# Patient Record
Sex: Male | Born: 1960
Health system: Southern US, Community
[De-identification: ages and names within clinical notes are randomized; demographics above are authoritative.]

## PROBLEM LIST (undated history)

## (undated) DIAGNOSIS — I2699 Other pulmonary embolism without acute cor pulmonale: Secondary | ICD-10-CM

## (undated) DIAGNOSIS — L219 Seborrheic dermatitis, unspecified: Secondary | ICD-10-CM

## (undated) DIAGNOSIS — I471 Supraventricular tachycardia, unspecified: Secondary | ICD-10-CM

## (undated) DIAGNOSIS — J45909 Unspecified asthma, uncomplicated: Secondary | ICD-10-CM

## (undated) DIAGNOSIS — D510 Vitamin B12 deficiency anemia due to intrinsic factor deficiency: Secondary | ICD-10-CM

## (undated) DIAGNOSIS — E039 Hypothyroidism, unspecified: Secondary | ICD-10-CM

## (undated) DIAGNOSIS — T7840XA Allergy, unspecified, initial encounter: Secondary | ICD-10-CM

## (undated) DIAGNOSIS — M1990 Unspecified osteoarthritis, unspecified site: Secondary | ICD-10-CM

## (undated) DIAGNOSIS — E119 Type 2 diabetes mellitus without complications: Secondary | ICD-10-CM

## (undated) DIAGNOSIS — Z5189 Encounter for other specified aftercare: Secondary | ICD-10-CM

## (undated) HISTORY — DX: Other pulmonary embolism without acute cor pulmonale: I26.99

## (undated) HISTORY — DX: Unspecified asthma, uncomplicated: J45.909

## (undated) HISTORY — DX: Vitamin B12 deficiency anemia due to intrinsic factor deficiency: D51.0

## (undated) HISTORY — DX: Allergy, unspecified, initial encounter: T78.40XA

## (undated) HISTORY — DX: Hypothyroidism, unspecified: E03.9

## (undated) HISTORY — DX: Unspecified osteoarthritis, unspecified site: M19.90

## (undated) HISTORY — DX: Seborrheic dermatitis, unspecified: L21.9

## (undated) HISTORY — PX: OTHER SURGICAL HISTORY: SHX169

## (undated) HISTORY — DX: Encounter for other specified aftercare: Z51.89

## (undated) HISTORY — DX: Supraventricular tachycardia: I47.1

## (undated) HISTORY — DX: Supraventricular tachycardia, unspecified: I47.10

## (undated) HISTORY — DX: Type 2 diabetes mellitus without complications: E11.9

---

## 1999-12-09 ENCOUNTER — Emergency Department (HOSPITAL_COMMUNITY): Admission: EM | Admit: 1999-12-09 | Discharge: 1999-12-09 | Payer: Self-pay | Admitting: *Deleted

## 2002-04-27 ENCOUNTER — Ambulatory Visit (HOSPITAL_COMMUNITY): Admission: RE | Admit: 2002-04-27 | Discharge: 2002-04-27 | Payer: Self-pay | Admitting: Pulmonary Disease

## 2005-05-05 ENCOUNTER — Emergency Department (HOSPITAL_COMMUNITY): Admission: EM | Admit: 2005-05-05 | Discharge: 2005-05-05 | Payer: Self-pay | Admitting: Emergency Medicine

## 2007-07-16 ENCOUNTER — Ambulatory Visit (HOSPITAL_COMMUNITY): Admission: RE | Admit: 2007-07-16 | Discharge: 2007-07-16 | Payer: Self-pay | Admitting: Pulmonary Disease

## 2010-03-18 ENCOUNTER — Encounter: Payer: Self-pay | Admitting: Pulmonary Disease

## 2010-07-13 NOTE — Procedures (Signed)
   Justin Klein, Justin Klein                          ACCOUNT NO.:  [id number]   MEDICAL RECORD NO.:  [id number]                   PATIENT TYPE:  OUT   LOCATION:  RESP                                 FACILITY:  [facility]   PHYSICIAN:  Edward L. Juanetta Gosling, M.D.             DATE OF BIRTH:  08/23/1960   DATE OF PROCEDURE:  DATE OF DISCHARGE:  04/27/2002                              PULMONARY FUNCTION TEST   PFT FINDINGS:  1. Spirometry shows air flow obstruction at the area of the small airway but     otherwise is normal.  2. Lung volumes are normal.  3. DLCO is normal.  4. Arterial blood gas shows mild resting hypoxemia.                                               Edward L. Juanetta Gosling, M.D.    ELH/MEDQ  D:  04/28/2002  T:  04/29/2002  Job:  [id number]

## 2010-10-27 ENCOUNTER — Inpatient Hospital Stay: Payer: Self-pay | Admitting: Student

## 2010-10-27 ENCOUNTER — Ambulatory Visit: Payer: Self-pay | Admitting: Internal Medicine

## 2010-11-05 ENCOUNTER — Ambulatory Visit: Payer: Self-pay | Admitting: Internal Medicine

## 2010-11-26 ENCOUNTER — Ambulatory Visit: Payer: Self-pay | Admitting: Internal Medicine

## 2011-04-26 ENCOUNTER — Other Ambulatory Visit (HOSPITAL_COMMUNITY): Payer: Self-pay | Admitting: Pulmonary Disease

## 2011-04-26 DIAGNOSIS — Z86711 Personal history of pulmonary embolism: Secondary | ICD-10-CM

## 2011-04-26 DIAGNOSIS — M79606 Pain in leg, unspecified: Secondary | ICD-10-CM

## 2011-05-03 ENCOUNTER — Ambulatory Visit (HOSPITAL_COMMUNITY)
Admission: RE | Admit: 2011-05-03 | Discharge: 2011-05-03 | Disposition: A | Discharge status: Home / Self Care | Payer: Managed Care, Other (non HMO) | Source: Ambulatory Visit | Attending: Pulmonary Disease | Admitting: Pulmonary Disease

## 2011-05-03 DIAGNOSIS — M79606 Pain in leg, unspecified: Secondary | ICD-10-CM

## 2011-05-03 DIAGNOSIS — Z86711 Personal history of pulmonary embolism: Secondary | ICD-10-CM

## 2011-05-03 DIAGNOSIS — Z86718 Personal history of other venous thrombosis and embolism: Secondary | ICD-10-CM | POA: Insufficient documentation

## 2011-05-03 DIAGNOSIS — M79609 Pain in unspecified limb: Secondary | ICD-10-CM | POA: Insufficient documentation

## 2011-05-13 ENCOUNTER — Ambulatory Visit (HOSPITAL_COMMUNITY)
Admission: RE | Admit: 2011-05-13 | Discharge: 2011-05-13 | Disposition: A | Discharge status: Home / Self Care | Payer: Managed Care, Other (non HMO) | Source: Ambulatory Visit | Attending: Pulmonary Disease | Admitting: Pulmonary Disease

## 2011-05-13 DIAGNOSIS — I499 Cardiac arrhythmia, unspecified: Secondary | ICD-10-CM | POA: Insufficient documentation

## 2011-05-13 DIAGNOSIS — I4949 Other premature depolarization: Secondary | ICD-10-CM | POA: Insufficient documentation

## 2011-05-13 NOTE — Progress Notes (Signed)
*  PRELIMINARY RESULTS* Echocardiogram 48H Holter monitor has been performed.  Justin Klein 05/13/2011, 3:05 PM

## 2011-05-20 ENCOUNTER — Encounter: Payer: Self-pay | Admitting: Cardiology

## 2011-08-07 ENCOUNTER — Ambulatory Visit: Payer: Managed Care, Other (non HMO) | Admitting: Cardiology

## 2011-08-26 ENCOUNTER — Encounter: Payer: Self-pay | Admitting: Cardiology

## 2011-08-26 ENCOUNTER — Ambulatory Visit (INDEPENDENT_AMBULATORY_CARE_PROVIDER_SITE_OTHER): Payer: Managed Care, Other (non HMO) | Admitting: Cardiology

## 2011-08-26 VITALS — BP 128/82 | HR 69 | Ht 69.0 in | Wt 206.0 lb

## 2011-08-26 DIAGNOSIS — I471 Supraventricular tachycardia, unspecified: Secondary | ICD-10-CM

## 2011-08-26 DIAGNOSIS — I2699 Other pulmonary embolism without acute cor pulmonale: Secondary | ICD-10-CM

## 2011-08-26 DIAGNOSIS — J45909 Unspecified asthma, uncomplicated: Secondary | ICD-10-CM | POA: Insufficient documentation

## 2011-08-26 HISTORY — DX: Supraventricular tachycardia: I47.1

## 2011-08-26 HISTORY — DX: Other pulmonary embolism without acute cor pulmonale: I26.99

## 2011-08-26 HISTORY — DX: Unspecified asthma, uncomplicated: J45.909

## 2011-08-26 HISTORY — DX: Supraventricular tachycardia, unspecified: I47.10

## 2011-08-26 MED ORDER — DILTIAZEM HCL 30 MG PO TABS: 30.0000 mg | ORAL_TABLET | ORAL | Status: DC | PRN

## 2011-08-26 NOTE — Progress Notes (Signed)
Clinical Summary Mr. Justin Klein is a 51 y.o.male referred for cardiology consultation by Dr. Juanetta Gosling. He reports a long-standing history of intermittent palpitations since he was a teenager. He describes a sudden onset of rapid heart rate, sometimes preceded by a feeling of a "skip." This does not occur in any specific pattern, has not been associated with syncope. He has had episodes noted within the last few months associated with mild dizziness.  48-hour Holter monitor reviewed from March of this year, ordered by Dr. Juanetta Gosling, demonstrating sinus rhythm ranging 48-144 beats per minute with occasional PVCs and PACs. He did have some brief runs of SVT as well as possible atrial fibrillation. No pauses noted. Nonspecific ST-T changes noted mainly in channel 2. No symptoms were reported in the diary. This was followed by a CardioNet monitor which was ordered in April through May. This study showed sinus rhythm with occasional PVCs, also evidence of regular PSVT as fast as 213 beats per minute. One episode of bradycardia noted into the high 30s There were no pauses.  Recent lab work from June showed hemoglobin 14.1, platelets 275, vitamin B12 534.  Today we reviewed the findings of his heart monitor, implications of PSVT, probable mechanism, and general treatment strategies including vagal maneuvers, avoiding stimulants, and medical therapy. We also briefly discussed possibility of radiofrequency ablation. At this point he preferred medical therapy and observation.   No Known Allergies  Current Outpatient Prescriptions  Medication Sig Dispense Refill  . albuterol (PROVENTIL) (2.5 MG/3ML) 0.083% nebulizer solution Take 2.5 mg by nebulization every 6 (six) hours as needed.      . budesonide-formoterol (SYMBICORT) 160-4.5 MCG/ACT inhaler Inhale 2 puffs into the lungs 2 (two) times daily.      Marland Kitchen levothyroxine (SYNTHROID) 137 MCG tablet Take 137 mcg by mouth daily.      . sildenafil (VIAGRA) 50 MG tablet  Take 50 mg by mouth as needed.      . warfarin (COUMADIN) 5 MG tablet Take 5 mg by mouth daily.      Marland Kitchen diltiazem (CARDIZEM) 30 MG tablet Take 1 tablet (30 mg total) by mouth as needed.  30 tablet  0    Past Medical History  Diagnosis Date  . Seborrheic dermatitis   . Diabetes mellitus, type 2   . Asthma   . Hypothyroidism   . Pernicious anemia   . Pulmonary embolus     Diagnosed 9/12 at [facility]    Past Surgical History  Procedure Date  . Left patella tendon repair   . Epidermal cyst resection     Family History  Problem Relation Age of Onset  . Diabetes type II Mother   . Cancer Father   . Diabetes type II Sister     Social History Mr. Ellery reports that he has never smoked. He has never used smokeless tobacco. Mr. Kron reports that he does not drink alcohol.  Review of Systems No regular exertional chest pain or unusual shortness of breath. No reported bleeding problems on Coumadin, followed by Dr. Juanetta Gosling. Otherwise negative.  Physical Examination Filed Vitals:   08/26/11 1005  BP: 128/82  Pulse: 69   Overweight male in no acute distress. HEENT: Conjunctiva and lids normal, oropharynx clear. Neck: Supple, no elevated JVP or carotid bruits, no thyromegaly. Lungs: Clear to auscultation, nonlabored breathing at rest. Cardiac: Regular rate and rhythm, no S3 or significant systolic murmur, no pericardial rub. Abdomen: Soft, nontender, bowel sounds present, no guarding or rebound. Extremities: No pitting edema,  distal pulses 2+. Skin: Warm and dry. Musculoskeletal: No kyphosis. Neuropsychiatric: Alert and oriented x3, affect grossly appropriate.   ECG Normal sinus rhythm.   Problem List and Plan   PSVT (paroxysmal supraventricular tachycardia) Outlined above, likely reentrant mechanism such as AVNRT. We will obtain a complete echo echocardiogram to assess cardiac structure and function. For now he will have Cardizem 30 mg p.r.n. for prolonged palpitations.  We also discussed vagal maneuvers. Depending on frequency and duration of symptoms, we may need to advance medical therapy, and/or proceed with an EP consultation. Followup arranged.  Asthma Seems to be adequately controlled on present regimen, followed by Dr. Juanetta Gosling.  Pulmonary embolus Reportedly diagnosed at [facility] back in September, on Coumadin per Dr. Juanetta Gosling.    Jonelle Sidle, M.D., F.A.C.C.

## 2011-08-26 NOTE — Patient Instructions (Addendum)
**Note De-Identified Onie Hayashi Obfuscation** Your physician has requested that you have an echocardiogram. Echocardiography is a painless test that uses sound waves to create images of your heart. It provides your doctor with information about the size and shape of your heart and how well your heart's chambers and valves are working. This procedure takes approximately one hour. There are no restrictions for this procedure.  Your physician has recommended you make the following change in your medication: start taking Cardizem 30 mg to be taken as needed for prolonged palpitations  Your physician recommends that you schedule a follow-up appointment in: 3 months

## 2011-08-26 NOTE — Assessment & Plan Note (Signed)
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 We will obtain a complete echo echocardiogram to assess cardiac structure and function. For now he will have Cardizem 30 mg p.r.n. for prolonged palpitations. We also discussed vagal maneuvers. Depending on frequency and duration of symptoms, we may need to advance medical therapy, and/or proceed with an EP consultation. Followup arranged.

## 2011-08-26 NOTE — Assessment & Plan Note (Signed)
Reportedly diagnosed at [facility] back in September, on Coumadin per Dr. Juanetta Gosling.

## 2011-08-26 NOTE — Assessment & Plan Note (Signed)
Seems to be adequately controlled on present regimen, followed by Dr. Juanetta Gosling.

## 2011-08-30 ENCOUNTER — Ambulatory Visit (HOSPITAL_COMMUNITY): Payer: Managed Care, Other (non HMO) | Attending: Cardiology

## 2011-08-30 DIAGNOSIS — I471 Supraventricular tachycardia, unspecified: Secondary | ICD-10-CM | POA: Insufficient documentation

## 2011-08-30 DIAGNOSIS — I2699 Other pulmonary embolism without acute cor pulmonale: Secondary | ICD-10-CM | POA: Insufficient documentation

## 2011-08-30 DIAGNOSIS — J45909 Unspecified asthma, uncomplicated: Secondary | ICD-10-CM | POA: Insufficient documentation

## 2011-08-30 DIAGNOSIS — I517 Cardiomegaly: Secondary | ICD-10-CM | POA: Insufficient documentation

## 2011-08-30 DIAGNOSIS — E119 Type 2 diabetes mellitus without complications: Secondary | ICD-10-CM | POA: Insufficient documentation

## 2011-08-30 DIAGNOSIS — R002 Palpitations: Secondary | ICD-10-CM | POA: Insufficient documentation

## 2011-08-30 NOTE — Progress Notes (Signed)
Echocardiogram performed.  

## 2011-09-05 ENCOUNTER — Ambulatory Visit (INDEPENDENT_AMBULATORY_CARE_PROVIDER_SITE_OTHER): Payer: Managed Care, Other (non HMO) | Admitting: Otolaryngology

## 2011-09-05 DIAGNOSIS — K121 Other forms of stomatitis: Secondary | ICD-10-CM

## 2011-09-05 DIAGNOSIS — R490 Dysphonia: Secondary | ICD-10-CM

## 2011-10-22 ENCOUNTER — Other Ambulatory Visit (HOSPITAL_COMMUNITY): Payer: Self-pay | Admitting: Pulmonary Disease

## 2011-10-22 DIAGNOSIS — I82409 Acute embolism and thrombosis of unspecified deep veins of unspecified lower extremity: Secondary | ICD-10-CM

## 2011-10-22 DIAGNOSIS — O2230 Deep phlebothrombosis in pregnancy, unspecified trimester: Secondary | ICD-10-CM

## 2011-10-23 ENCOUNTER — Ambulatory Visit (HOSPITAL_COMMUNITY)
Admission: RE | Admit: 2011-10-23 | Discharge: 2011-10-23 | Disposition: A | Discharge status: Home / Self Care | Payer: Managed Care, Other (non HMO) | Source: Ambulatory Visit | Attending: Pulmonary Disease | Admitting: Pulmonary Disease

## 2011-10-23 DIAGNOSIS — I82409 Acute embolism and thrombosis of unspecified deep veins of unspecified lower extremity: Secondary | ICD-10-CM | POA: Insufficient documentation

## 2011-11-07 ENCOUNTER — Other Ambulatory Visit: Payer: Self-pay

## 2012-04-09 ENCOUNTER — Ambulatory Visit (HOSPITAL_COMMUNITY): Payer: BC Managed Care – PPO | Admitting: Physical Therapy

## 2012-04-16 ENCOUNTER — Ambulatory Visit (HOSPITAL_COMMUNITY)
Admission: RE | Admit: 2012-04-16 | Discharge: 2012-04-16 | Disposition: A | Discharge status: Home / Self Care | Payer: BC Managed Care – PPO | Source: Ambulatory Visit | Attending: Pulmonary Disease | Admitting: Pulmonary Disease

## 2012-04-16 DIAGNOSIS — G7289 Other specified myopathies: Secondary | ICD-10-CM | POA: Insufficient documentation

## 2012-04-16 DIAGNOSIS — IMO0001 Reserved for inherently not codable concepts without codable children: Secondary | ICD-10-CM | POA: Insufficient documentation

## 2012-04-16 DIAGNOSIS — M6281 Muscle weakness (generalized): Secondary | ICD-10-CM | POA: Insufficient documentation

## 2012-04-16 NOTE — Evaluation (Signed)
Physical Therapy Evaluation  Patient Details  Name: Justin Klein MRN: [id number] Date of Birth: 1960/12/08  Today's Date: 04/16/2012 Time: 1300-1610 PT Time Calculation (min): 190 min  Visit#: 1 of 1  Authorization: [employer]  Past Medical History:  Past Medical History  Diagnosis Date  . Seborrheic dermatitis   . Diabetes mellitus, type 2   . Asthma   . Hypothyroidism   . Pernicious anemia   . Pulmonary embolus     Diagnosed 9/12 at [facility]   Past Surgical History:  Past Surgical History  Procedure Laterality Date  . Left patella tendon repair    . Epidermal cyst resection        Physical Therapy Assessment and Plan PT Assessment and Plan Clinical Impression Statement: One time functional evaluation to see pt safe functioning limitations.  Please see FCE report which will be scanned into pt chart. PT Plan: one time evaluation    Goals  to determine pt safe functioning level.  Problem List Patient Active Problem List  Diagnosis  . PSVT (paroxysmal supraventricular tachycardia)  . Asthma  . Pulmonary embolus    PT Plan of Care PT Home Exercise Plan: N/A  GP    RUSSELL,CINDY 04/16/2012, 4:31 PM  Physician Documentation Your signature is required to indicate approval of the treatment plan as stated above.  Please sign and either send electronically or make a copy of this report for your files and return this physician signed original.   Please mark one 1.__approve of plan  2. ___approve of plan with the following conditions.   ______________________________                                                          _____________________ Physician Signature                                                                                                             Date

## 2012-07-21 DIAGNOSIS — R200 Anesthesia of skin: Secondary | ICD-10-CM

## 2012-07-21 DIAGNOSIS — R29898 Other symptoms and signs involving the musculoskeletal system: Secondary | ICD-10-CM

## 2012-07-21 HISTORY — DX: Anesthesia of skin: R20.0

## 2012-07-21 HISTORY — DX: Other symptoms and signs involving the musculoskeletal system: R29.898

## 2012-09-07 DIAGNOSIS — Z0289 Encounter for other administrative examinations: Secondary | ICD-10-CM

## 2012-10-20 ENCOUNTER — Other Ambulatory Visit: Payer: Self-pay | Admitting: Cardiology

## 2012-10-22 IMAGING — US ABDOMEN ULTRASOUND
1 series · 17 of 25 positions shown · non-contrast
Comparison: none

REASON FOR EXAM: hepatitis
COMMENTS:

[Series 1: abdomen ultrasound · 17 of 62 slices shown]
[im 1/62]
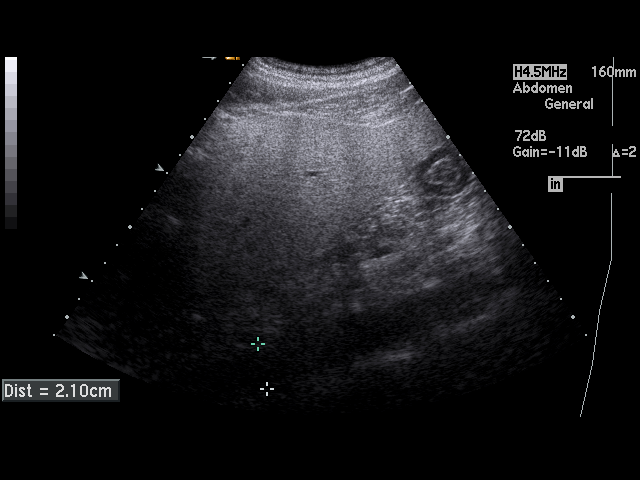
[im 6/62]
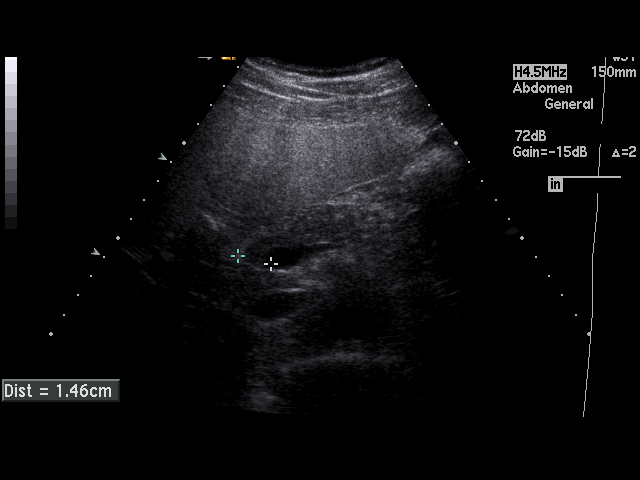
[im 8/62]
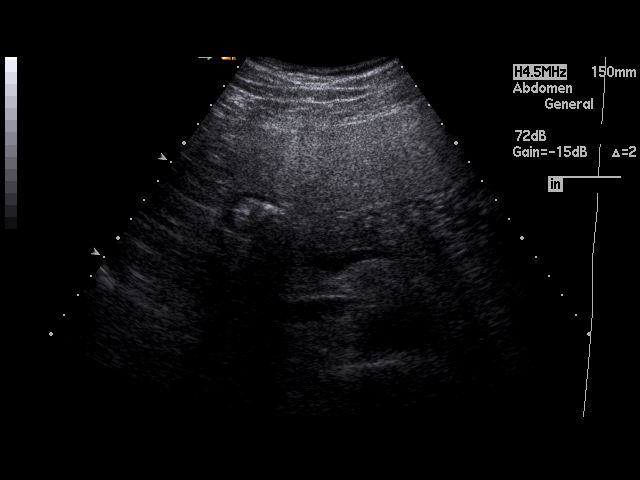
[im 13/62]
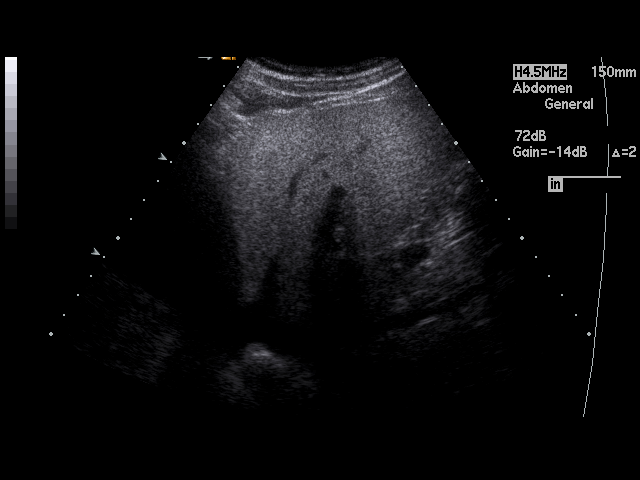
[im 16/62]
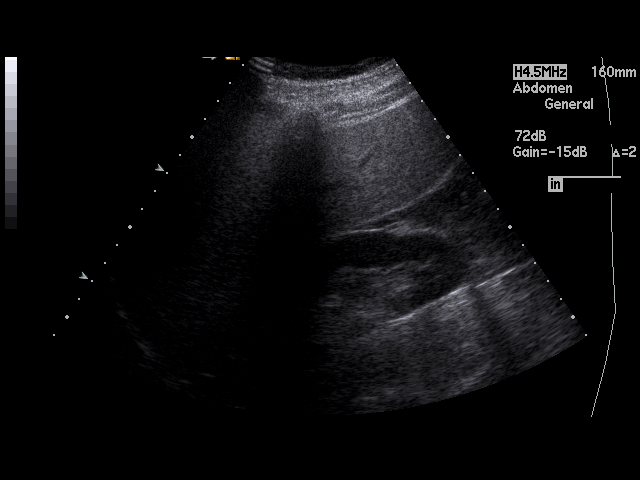
[im 21/62]
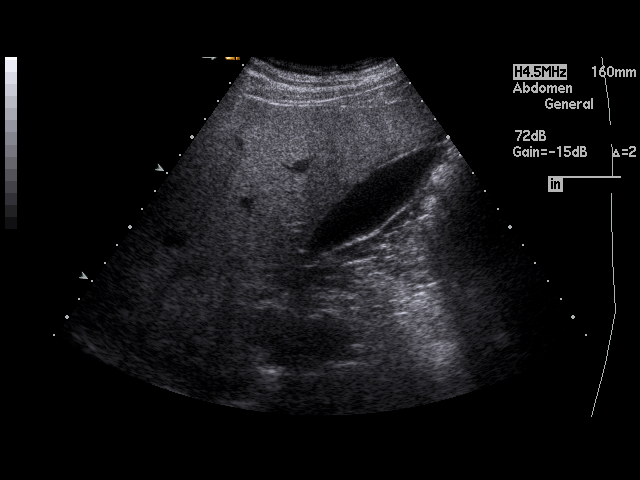
[im 23/62]
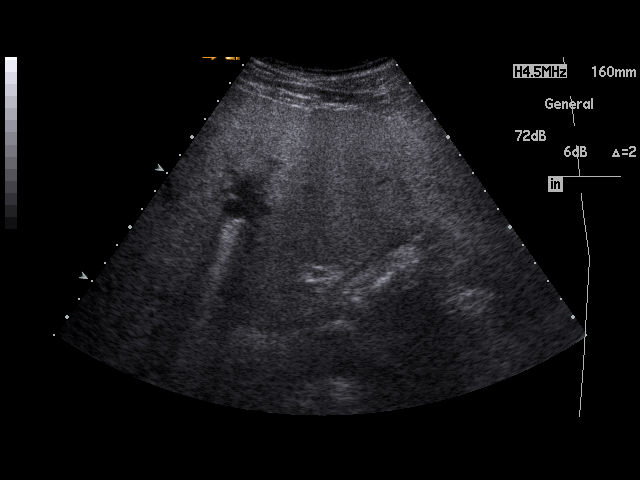
[im 28/62]
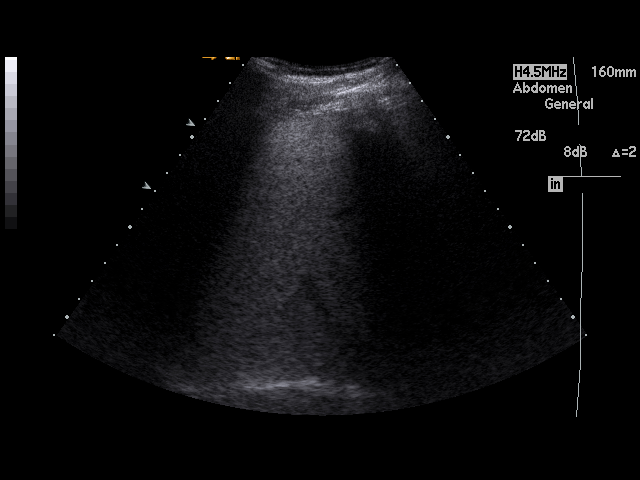
[im 31/62]
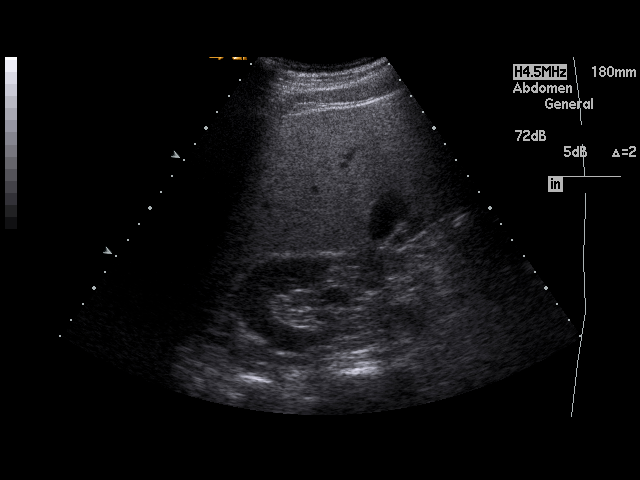
[im 34/62]
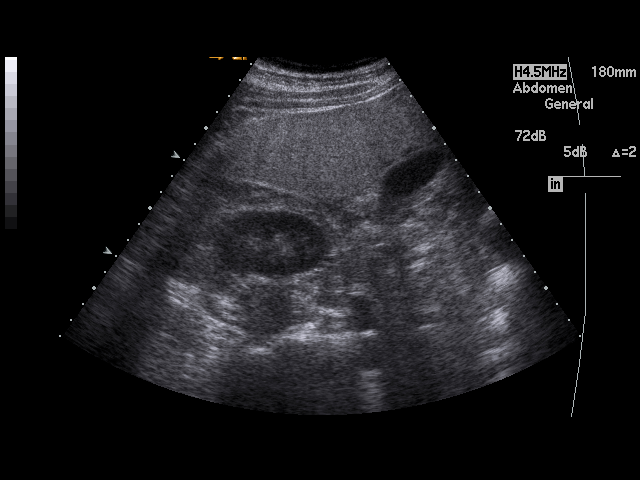
[im 39/62]
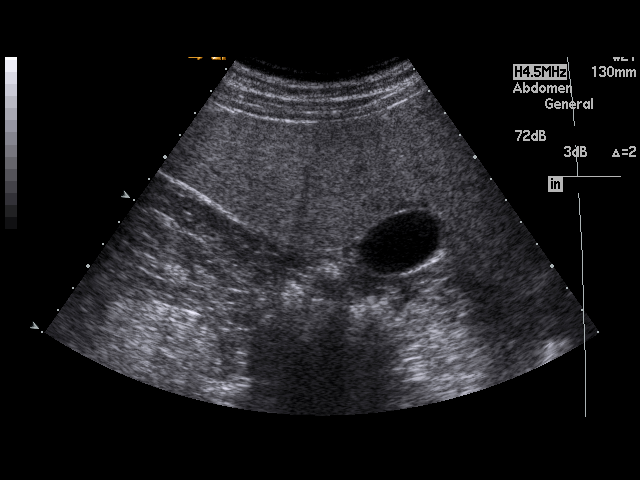
[im 41/62]
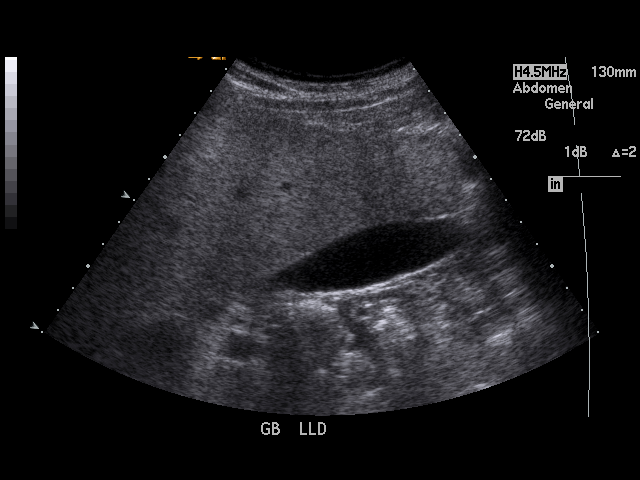
[im 46/62]
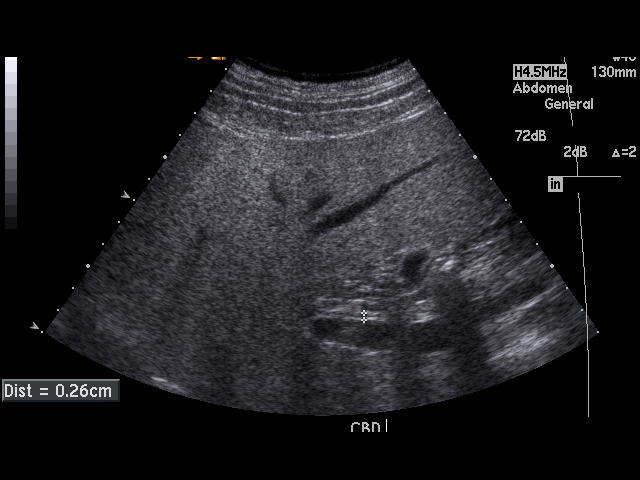
[im 49/62]
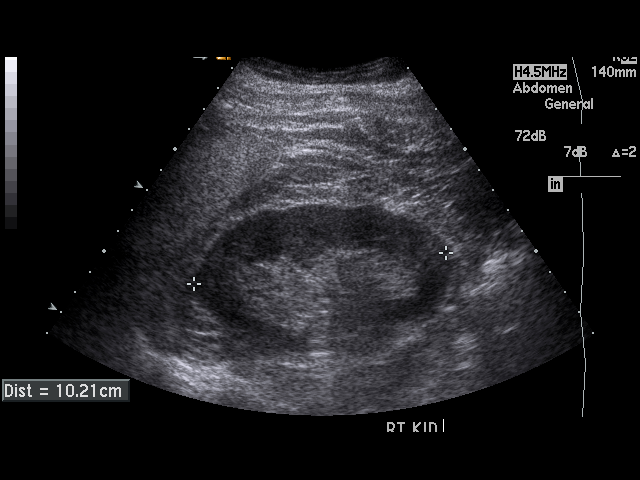
[im 54/62]
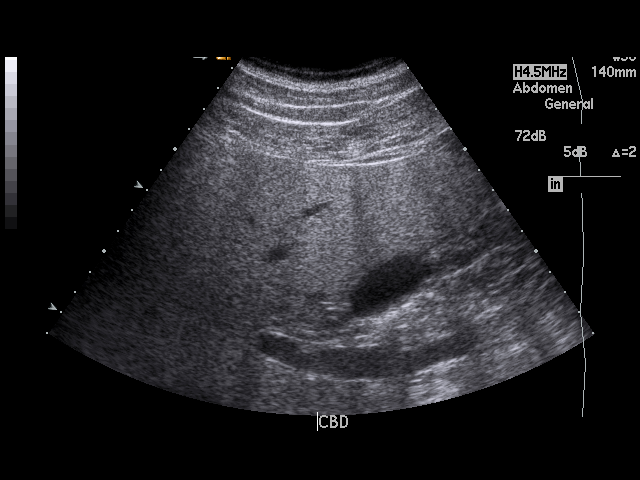
[im 56/62]
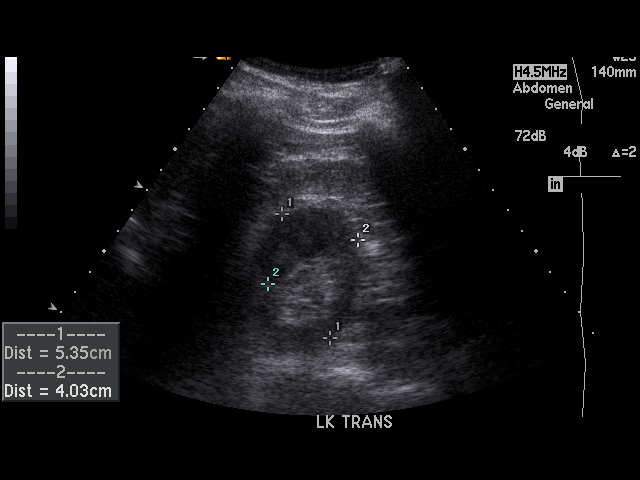
[im 62/62]
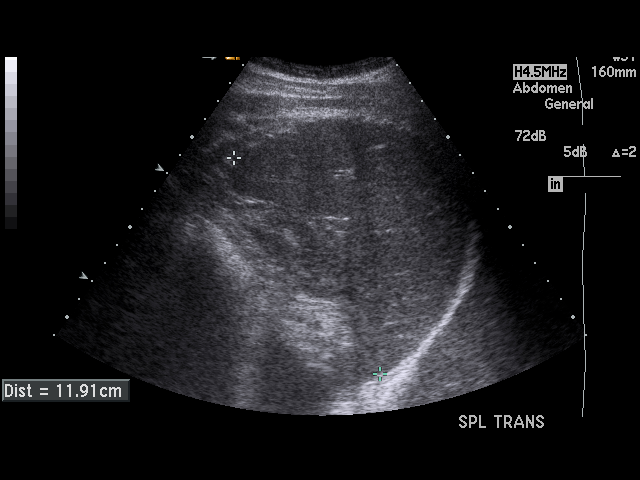

[17 of 25 positions shown; findings below may reference images not displayed]

PROCEDURE:     US  - US ABDOMEN GENERAL SURVEY  - October 27, 2010 [DATE]

RESULT:     Ultrasound of the abdomen is performed. The liver shows
extensive increased echogenicity consistent with diffuse fatty infiltration.
The pancreas is somewhat heterogeneous which could represent fatty
infiltration. The spleen is borderline enlarged. Right kidney is 10.21 cm
length. Left kidney 12.17 cm length. No gallstones are evident. Gallbladder
wall thickness is 1.5 mm. No pericholecystic fluid is evident. Common bile
but diameter 3.6 mm. Portal venous flow is normal. The visualized aorta and
inferior vena cava appear unremarkable.
IMPRESSION: 1. Heterogeneous appearance of the pancreas without focal mass. Increased
echotexture of the liver consistent with diffuse fatty infiltration. No
biliary ductal dilation.

## 2012-10-27 DIAGNOSIS — Z0289 Encounter for other administrative examinations: Secondary | ICD-10-CM

## 2012-12-11 DIAGNOSIS — Z0289 Encounter for other administrative examinations: Secondary | ICD-10-CM

## 2013-04-28 IMAGING — US US EXTREM LOW VENOUS BILAT
1 series · 14 of 24 positions shown · non-contrast
Comparison: None

CLINICAL DATA: History of pulmonary emboli.  Diabetes, previous
tobacco use, bilateral foot numbness.

BILATERAL LOWER EXTREMITY VENOUS DOPPLER ULTRASOUND
TECHNIQUE: Gray-scale sonography with compression, as well as color
and duplex ultrasound, were performed to evaluate the deep venous
system from the level of the common femoral vein through the
popliteal and proximal calf veins.

[Series 1: us extrem low venous bilat · 14 of 53 slices shown]
[im 1/53]
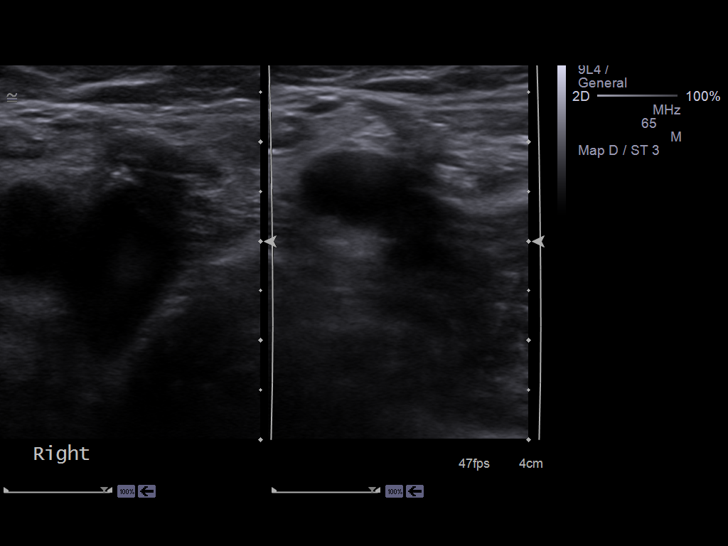
[im 5/53]
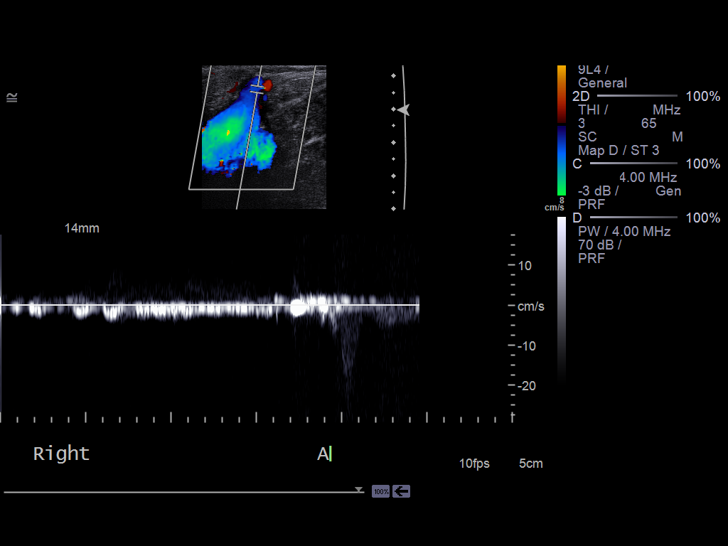
[im 10/53]
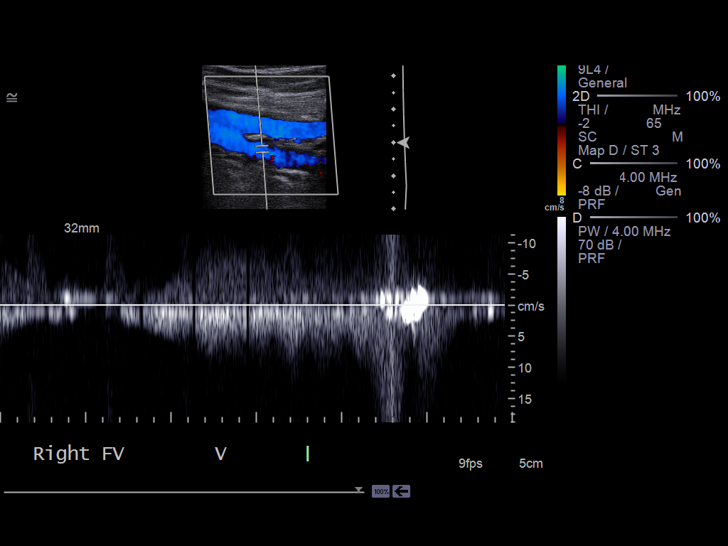
[im 14/53]
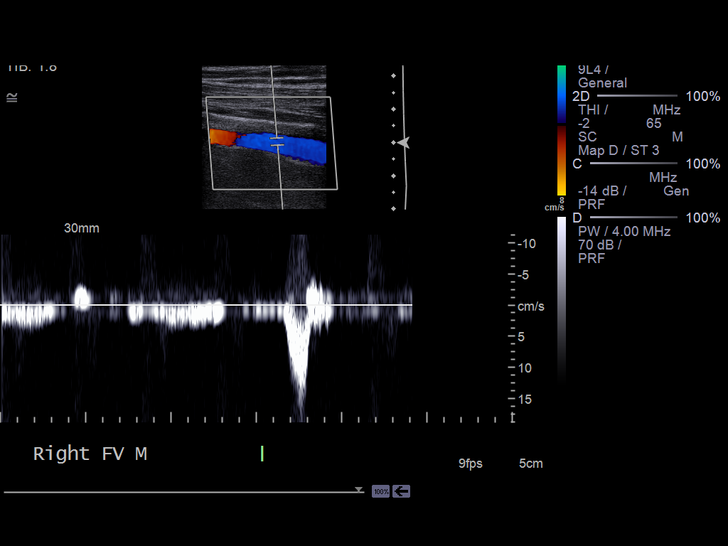
[im 16/53]
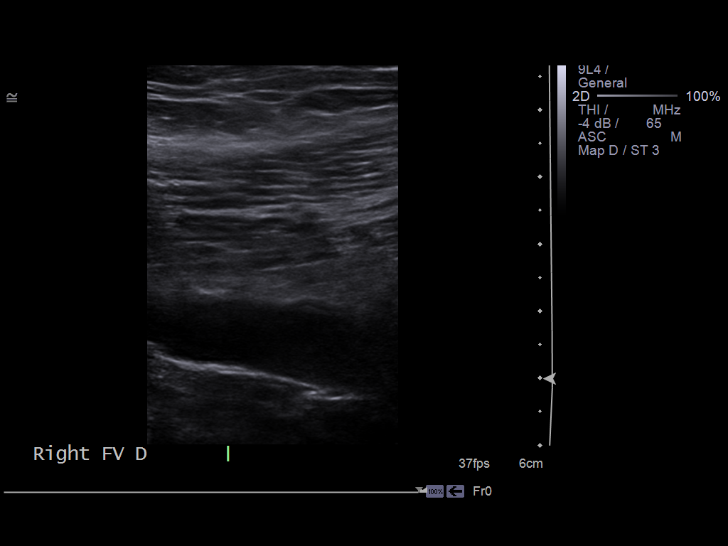
[im 21/53]
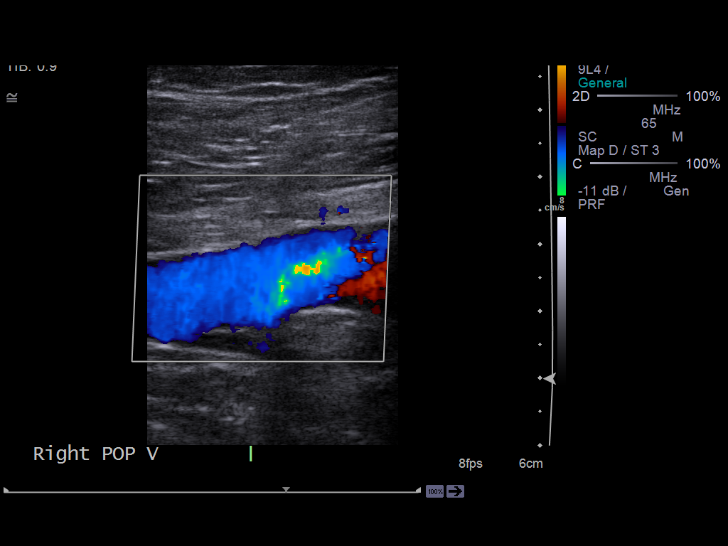
[im 25/53]
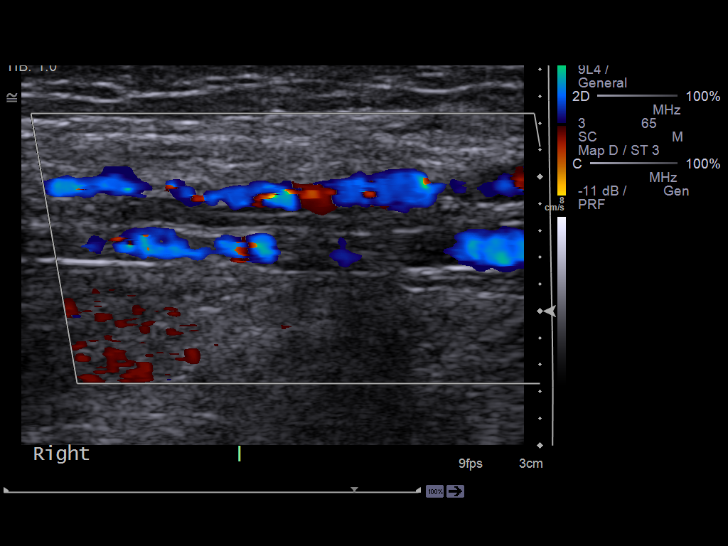
[im 28/53]
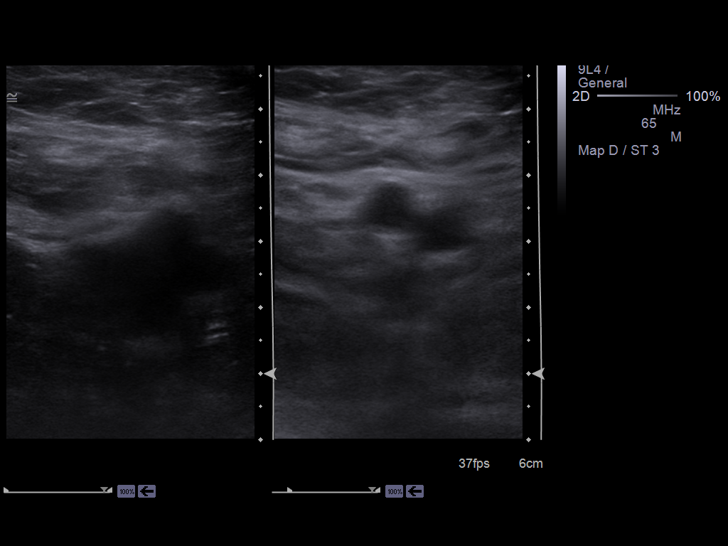
[im 32/53]
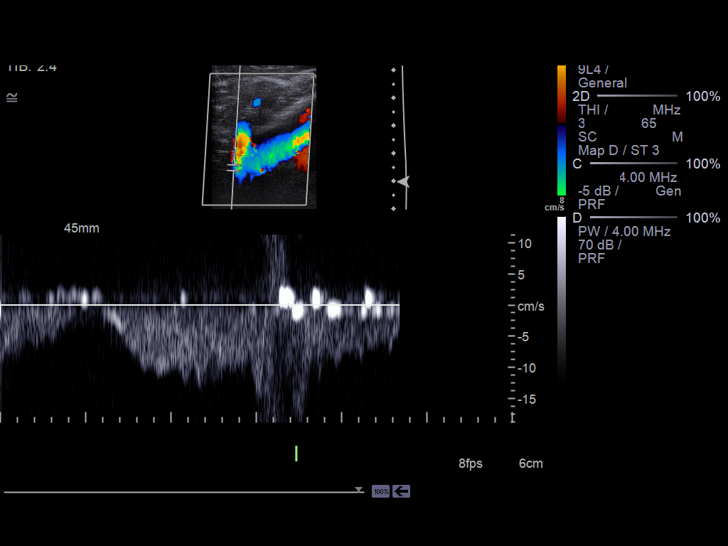
[im 37/53]
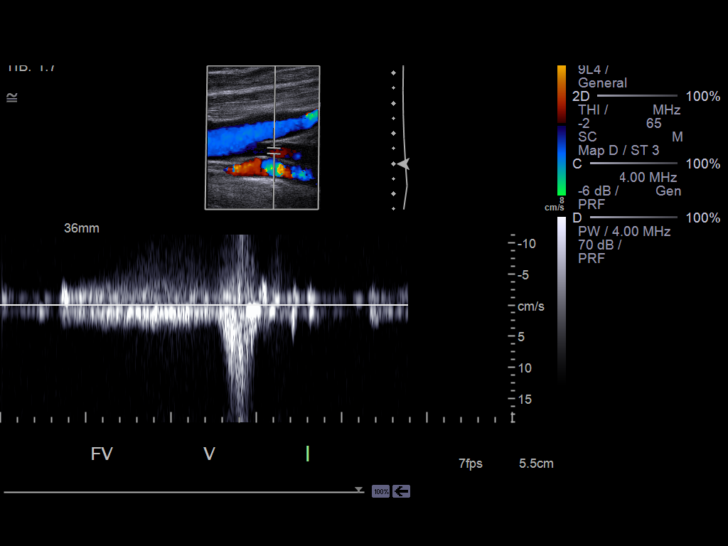
[im 41/53]
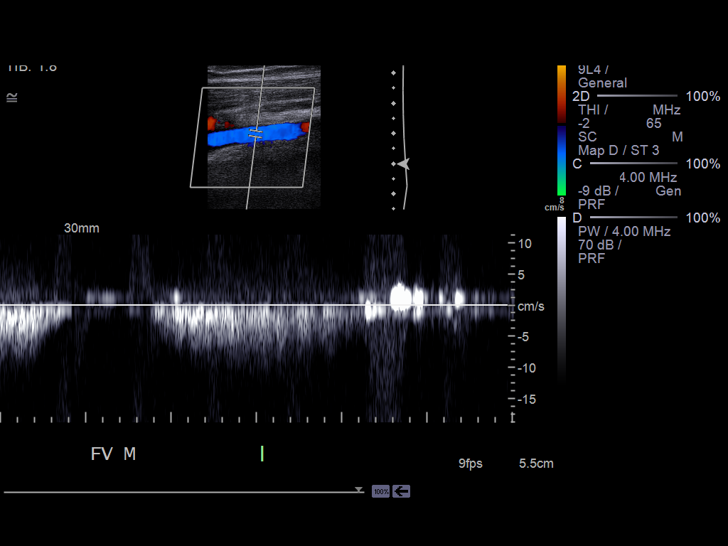
[im 43/53]
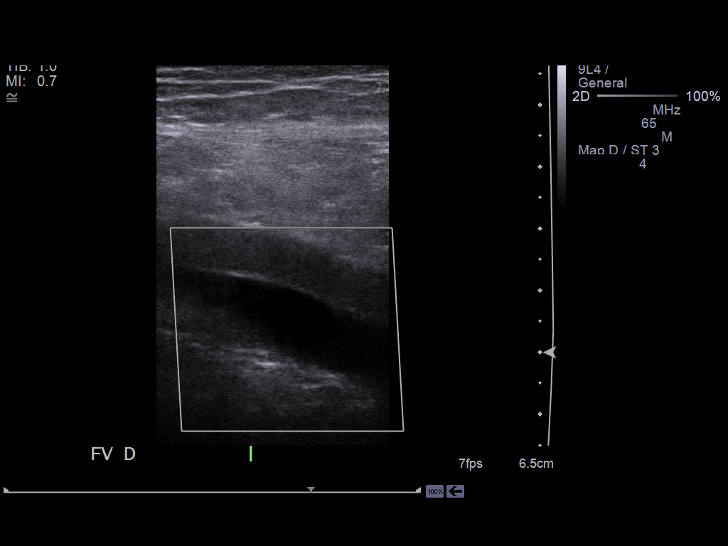
[im 48/53]
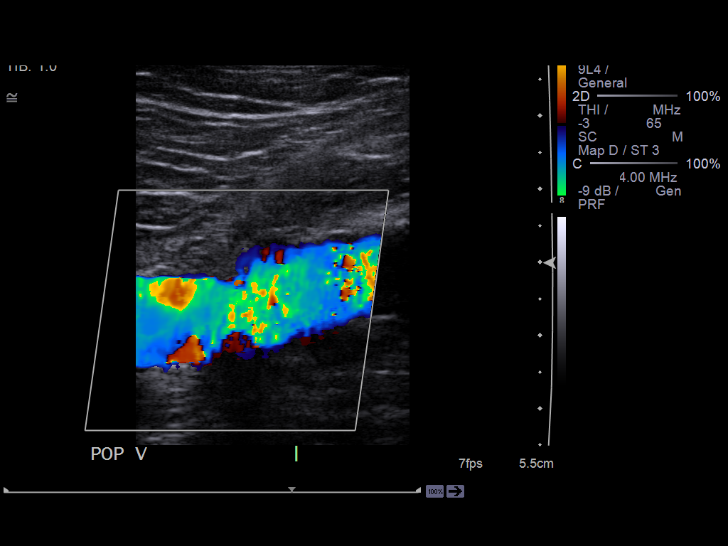
[im 53/53]
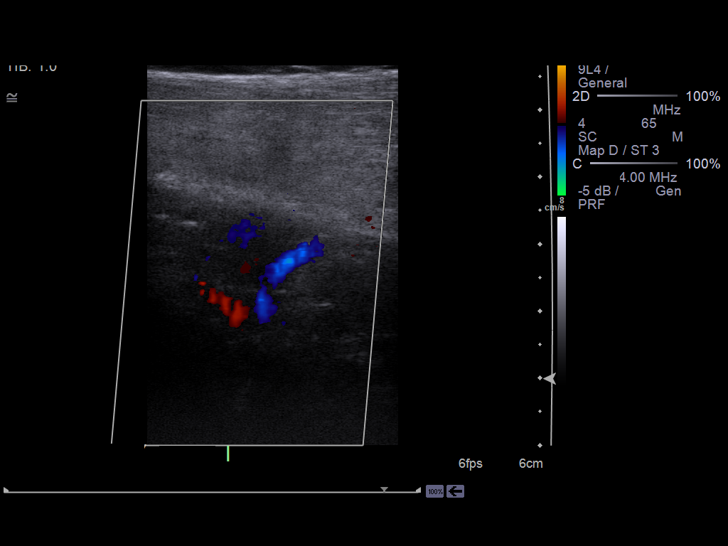

[14 of 24 positions shown; findings below may reference images not displayed]

FINDINGS: Normal compressibility of  the common femoral,
superficial femoral, and popliteal veins, as well as the proximal
calf veins.  No filling defects to suggest DVT on grayscale or
color Doppler imaging.  Doppler waveforms show normal direction of
venous flow, normal respiratory phasicity and response to
augmentation.
IMPRESSION: No evidence of  lower extremity deep vein thrombosis.

## 2015-04-17 DIAGNOSIS — E119 Type 2 diabetes mellitus without complications: Secondary | ICD-10-CM | POA: Diagnosis not present

## 2015-04-17 DIAGNOSIS — M25562 Pain in left knee: Secondary | ICD-10-CM | POA: Diagnosis not present

## 2015-04-20 DIAGNOSIS — M25562 Pain in left knee: Secondary | ICD-10-CM | POA: Diagnosis not present

## 2015-07-25 DIAGNOSIS — J449 Chronic obstructive pulmonary disease, unspecified: Secondary | ICD-10-CM | POA: Diagnosis not present

## 2015-07-25 DIAGNOSIS — E119 Type 2 diabetes mellitus without complications: Secondary | ICD-10-CM | POA: Diagnosis not present

## 2015-07-25 DIAGNOSIS — M545 Low back pain: Secondary | ICD-10-CM | POA: Diagnosis not present

## 2015-07-25 DIAGNOSIS — G729 Myopathy, unspecified: Secondary | ICD-10-CM | POA: Diagnosis not present

## 2015-08-31 DIAGNOSIS — M5126 Other intervertebral disc displacement, lumbar region: Secondary | ICD-10-CM | POA: Diagnosis not present

## 2015-08-31 DIAGNOSIS — M47817 Spondylosis without myelopathy or radiculopathy, lumbosacral region: Secondary | ICD-10-CM | POA: Diagnosis not present

## 2015-08-31 DIAGNOSIS — M5127 Other intervertebral disc displacement, lumbosacral region: Secondary | ICD-10-CM | POA: Diagnosis not present

## 2015-09-28 ENCOUNTER — Other Ambulatory Visit: Payer: Self-pay | Admitting: Dermatology

## 2015-09-28 DIAGNOSIS — L309 Dermatitis, unspecified: Secondary | ICD-10-CM | POA: Diagnosis not present

## 2015-09-28 DIAGNOSIS — L720 Epidermal cyst: Secondary | ICD-10-CM | POA: Diagnosis not present

## 2015-09-28 DIAGNOSIS — B360 Pityriasis versicolor: Secondary | ICD-10-CM | POA: Diagnosis not present

## 2015-10-25 DIAGNOSIS — E119 Type 2 diabetes mellitus without complications: Secondary | ICD-10-CM | POA: Diagnosis not present

## 2015-10-25 DIAGNOSIS — G729 Myopathy, unspecified: Secondary | ICD-10-CM | POA: Diagnosis not present

## 2015-10-25 DIAGNOSIS — R634 Abnormal weight loss: Secondary | ICD-10-CM | POA: Diagnosis not present

## 2015-10-25 DIAGNOSIS — J45909 Unspecified asthma, uncomplicated: Secondary | ICD-10-CM | POA: Diagnosis not present

## 2015-11-10 DIAGNOSIS — M5135 Other intervertebral disc degeneration, thoracolumbar region: Secondary | ICD-10-CM | POA: Diagnosis not present

## 2015-11-10 DIAGNOSIS — M9903 Segmental and somatic dysfunction of lumbar region: Secondary | ICD-10-CM | POA: Diagnosis not present

## 2015-11-10 DIAGNOSIS — M9904 Segmental and somatic dysfunction of sacral region: Secondary | ICD-10-CM | POA: Diagnosis not present

## 2015-11-10 DIAGNOSIS — M5137 Other intervertebral disc degeneration, lumbosacral region: Secondary | ICD-10-CM | POA: Diagnosis not present

## 2015-11-10 DIAGNOSIS — M5136 Other intervertebral disc degeneration, lumbar region: Secondary | ICD-10-CM | POA: Diagnosis not present

## 2015-11-10 DIAGNOSIS — M9902 Segmental and somatic dysfunction of thoracic region: Secondary | ICD-10-CM | POA: Diagnosis not present

## 2015-12-15 DIAGNOSIS — L600 Ingrowing nail: Secondary | ICD-10-CM | POA: Diagnosis not present

## 2015-12-15 DIAGNOSIS — M79673 Pain in unspecified foot: Secondary | ICD-10-CM | POA: Diagnosis not present

## 2015-12-29 DIAGNOSIS — E1140 Type 2 diabetes mellitus with diabetic neuropathy, unspecified: Secondary | ICD-10-CM | POA: Diagnosis not present

## 2015-12-29 DIAGNOSIS — L600 Ingrowing nail: Secondary | ICD-10-CM | POA: Diagnosis not present

## 2015-12-29 DIAGNOSIS — M79673 Pain in unspecified foot: Secondary | ICD-10-CM | POA: Diagnosis not present

## 2016-01-04 DIAGNOSIS — M9904 Segmental and somatic dysfunction of sacral region: Secondary | ICD-10-CM | POA: Diagnosis not present

## 2016-01-04 DIAGNOSIS — M5137 Other intervertebral disc degeneration, lumbosacral region: Secondary | ICD-10-CM | POA: Diagnosis not present

## 2016-01-04 DIAGNOSIS — M9902 Segmental and somatic dysfunction of thoracic region: Secondary | ICD-10-CM | POA: Diagnosis not present

## 2016-01-04 DIAGNOSIS — M5135 Other intervertebral disc degeneration, thoracolumbar region: Secondary | ICD-10-CM | POA: Diagnosis not present

## 2016-01-04 DIAGNOSIS — M5136 Other intervertebral disc degeneration, lumbar region: Secondary | ICD-10-CM | POA: Diagnosis not present

## 2016-01-04 DIAGNOSIS — M9903 Segmental and somatic dysfunction of lumbar region: Secondary | ICD-10-CM | POA: Diagnosis not present

## 2016-01-12 DIAGNOSIS — L600 Ingrowing nail: Secondary | ICD-10-CM | POA: Diagnosis not present

## 2016-01-12 DIAGNOSIS — M79673 Pain in unspecified foot: Secondary | ICD-10-CM | POA: Diagnosis not present

## 2016-01-24 DIAGNOSIS — J45909 Unspecified asthma, uncomplicated: Secondary | ICD-10-CM | POA: Diagnosis not present

## 2016-01-24 DIAGNOSIS — G729 Myopathy, unspecified: Secondary | ICD-10-CM | POA: Diagnosis not present

## 2016-01-24 DIAGNOSIS — R634 Abnormal weight loss: Secondary | ICD-10-CM | POA: Diagnosis not present

## 2016-01-24 DIAGNOSIS — E119 Type 2 diabetes mellitus without complications: Secondary | ICD-10-CM | POA: Diagnosis not present

## 2016-01-26 DIAGNOSIS — E119 Type 2 diabetes mellitus without complications: Secondary | ICD-10-CM | POA: Diagnosis not present

## 2016-01-26 DIAGNOSIS — R634 Abnormal weight loss: Secondary | ICD-10-CM | POA: Diagnosis not present

## 2016-01-26 DIAGNOSIS — J45909 Unspecified asthma, uncomplicated: Secondary | ICD-10-CM | POA: Diagnosis not present

## 2016-01-26 DIAGNOSIS — G729 Myopathy, unspecified: Secondary | ICD-10-CM | POA: Diagnosis not present

## 2016-02-15 ENCOUNTER — Other Ambulatory Visit (HOSPITAL_COMMUNITY): Payer: Self-pay | Admitting: Pulmonary Disease

## 2016-02-15 DIAGNOSIS — R634 Abnormal weight loss: Secondary | ICD-10-CM

## 2016-02-15 DIAGNOSIS — R0602 Shortness of breath: Secondary | ICD-10-CM

## 2016-02-23 ENCOUNTER — Ambulatory Visit (HOSPITAL_COMMUNITY)
Admission: RE | Admit: 2016-02-23 | Discharge: 2016-02-23 | Disposition: A | Discharge status: Home / Self Care | Payer: PPO | Source: Ambulatory Visit | Attending: Pulmonary Disease | Admitting: Pulmonary Disease

## 2016-02-23 DIAGNOSIS — I700 Atherosclerosis of aorta: Secondary | ICD-10-CM | POA: Insufficient documentation

## 2016-02-23 DIAGNOSIS — R0602 Shortness of breath: Secondary | ICD-10-CM | POA: Insufficient documentation

## 2016-02-23 DIAGNOSIS — K760 Fatty (change of) liver, not elsewhere classified: Secondary | ICD-10-CM | POA: Insufficient documentation

## 2016-02-23 DIAGNOSIS — N400 Enlarged prostate without lower urinary tract symptoms: Secondary | ICD-10-CM | POA: Diagnosis not present

## 2016-02-23 DIAGNOSIS — R634 Abnormal weight loss: Secondary | ICD-10-CM | POA: Diagnosis not present

## 2016-02-23 LAB — POCT I-STAT CREATININE: Creatinine, Ser: 1.1 mg/dL (ref 0.61–1.24)

## 2016-02-23 MED ORDER — IOPAMIDOL (ISOVUE-300) INJECTION 61%
100.0000 mL | Freq: Once | INTRAVENOUS | Status: AC | PRN
  Administered 2016-02-23: 100 mL via INTRAVENOUS

## 2016-03-08 DIAGNOSIS — R634 Abnormal weight loss: Secondary | ICD-10-CM | POA: Diagnosis not present

## 2016-03-08 DIAGNOSIS — E1165 Type 2 diabetes mellitus with hyperglycemia: Secondary | ICD-10-CM | POA: Diagnosis not present

## 2016-03-08 DIAGNOSIS — K760 Fatty (change of) liver, not elsewhere classified: Secondary | ICD-10-CM | POA: Diagnosis not present

## 2016-03-08 DIAGNOSIS — J45909 Unspecified asthma, uncomplicated: Secondary | ICD-10-CM | POA: Diagnosis not present

## 2016-04-19 DIAGNOSIS — L989 Disorder of the skin and subcutaneous tissue, unspecified: Secondary | ICD-10-CM | POA: Diagnosis not present

## 2016-04-19 DIAGNOSIS — J45909 Unspecified asthma, uncomplicated: Secondary | ICD-10-CM | POA: Diagnosis not present

## 2016-04-19 DIAGNOSIS — G729 Myopathy, unspecified: Secondary | ICD-10-CM | POA: Diagnosis not present

## 2016-04-19 DIAGNOSIS — E1140 Type 2 diabetes mellitus with diabetic neuropathy, unspecified: Secondary | ICD-10-CM | POA: Diagnosis not present

## 2016-07-19 DIAGNOSIS — R634 Abnormal weight loss: Secondary | ICD-10-CM | POA: Diagnosis not present

## 2016-07-19 DIAGNOSIS — G729 Myopathy, unspecified: Secondary | ICD-10-CM | POA: Diagnosis not present

## 2016-07-19 DIAGNOSIS — E568 Deficiency of other vitamins: Secondary | ICD-10-CM | POA: Diagnosis not present

## 2016-07-19 DIAGNOSIS — E119 Type 2 diabetes mellitus without complications: Secondary | ICD-10-CM | POA: Diagnosis not present

## 2016-08-13 DIAGNOSIS — E875 Hyperkalemia: Secondary | ICD-10-CM | POA: Diagnosis not present

## 2016-08-13 DIAGNOSIS — W57XXXA Bitten or stung by nonvenomous insect and other nonvenomous arthropods, initial encounter: Secondary | ICD-10-CM | POA: Diagnosis not present

## 2016-09-03 DIAGNOSIS — R634 Abnormal weight loss: Secondary | ICD-10-CM | POA: Diagnosis not present

## 2016-09-03 DIAGNOSIS — G729 Myopathy, unspecified: Secondary | ICD-10-CM | POA: Diagnosis not present

## 2016-09-03 DIAGNOSIS — E119 Type 2 diabetes mellitus without complications: Secondary | ICD-10-CM | POA: Diagnosis not present

## 2016-09-03 DIAGNOSIS — B020 Zoster encephalitis: Secondary | ICD-10-CM | POA: Diagnosis not present

## 2016-11-04 DIAGNOSIS — I471 Supraventricular tachycardia: Secondary | ICD-10-CM | POA: Diagnosis not present

## 2016-11-04 DIAGNOSIS — R634 Abnormal weight loss: Secondary | ICD-10-CM | POA: Diagnosis not present

## 2016-11-04 DIAGNOSIS — E1140 Type 2 diabetes mellitus with diabetic neuropathy, unspecified: Secondary | ICD-10-CM | POA: Diagnosis not present

## 2016-11-04 DIAGNOSIS — J45909 Unspecified asthma, uncomplicated: Secondary | ICD-10-CM | POA: Diagnosis not present

## 2016-11-05 DIAGNOSIS — H5203 Hypermetropia, bilateral: Secondary | ICD-10-CM | POA: Diagnosis not present

## 2016-11-06 DIAGNOSIS — H43399 Other vitreous opacities, unspecified eye: Secondary | ICD-10-CM | POA: Diagnosis not present

## 2017-01-15 ENCOUNTER — Ambulatory Visit: Payer: PPO | Admitting: Urology

## 2017-01-15 DIAGNOSIS — N5201 Erectile dysfunction due to arterial insufficiency: Secondary | ICD-10-CM

## 2017-01-15 DIAGNOSIS — R102 Pelvic and perineal pain: Secondary | ICD-10-CM

## 2017-02-04 DIAGNOSIS — R61 Generalized hyperhidrosis: Secondary | ICD-10-CM | POA: Diagnosis not present

## 2017-02-04 DIAGNOSIS — J449 Chronic obstructive pulmonary disease, unspecified: Secondary | ICD-10-CM | POA: Diagnosis not present

## 2017-02-04 DIAGNOSIS — E039 Hypothyroidism, unspecified: Secondary | ICD-10-CM | POA: Diagnosis not present

## 2017-02-04 DIAGNOSIS — E1140 Type 2 diabetes mellitus with diabetic neuropathy, unspecified: Secondary | ICD-10-CM | POA: Diagnosis not present

## 2017-02-05 DIAGNOSIS — J449 Chronic obstructive pulmonary disease, unspecified: Secondary | ICD-10-CM | POA: Diagnosis not present

## 2017-02-05 DIAGNOSIS — E039 Hypothyroidism, unspecified: Secondary | ICD-10-CM | POA: Diagnosis not present

## 2017-02-05 DIAGNOSIS — R61 Generalized hyperhidrosis: Secondary | ICD-10-CM | POA: Diagnosis not present

## 2017-02-05 DIAGNOSIS — E1140 Type 2 diabetes mellitus with diabetic neuropathy, unspecified: Secondary | ICD-10-CM | POA: Diagnosis not present

## 2017-04-28 DIAGNOSIS — I471 Supraventricular tachycardia: Secondary | ICD-10-CM | POA: Diagnosis not present

## 2017-04-28 DIAGNOSIS — G729 Myopathy, unspecified: Secondary | ICD-10-CM | POA: Diagnosis not present

## 2017-04-28 DIAGNOSIS — J45909 Unspecified asthma, uncomplicated: Secondary | ICD-10-CM | POA: Diagnosis not present

## 2017-04-28 DIAGNOSIS — E1140 Type 2 diabetes mellitus with diabetic neuropathy, unspecified: Secondary | ICD-10-CM | POA: Diagnosis not present

## 2017-05-02 ENCOUNTER — Other Ambulatory Visit: Payer: Self-pay

## 2017-05-02 DIAGNOSIS — I471 Supraventricular tachycardia: Secondary | ICD-10-CM

## 2017-05-26 ENCOUNTER — Telehealth: Payer: Self-pay | Admitting: Cardiology

## 2017-05-26 ENCOUNTER — Telehealth: Payer: Self-pay

## 2017-05-26 NOTE — Telephone Encounter (Signed)
I enrolled pt for event monitor back in March and patient called [employer] for price quote.Per Mitch at [employer], they made several attempts to call patient back with price and could never get hold of patient.

## 2017-05-26 NOTE — Telephone Encounter (Signed)
I called Mitch at [employer], he states he will help patient determine his cost for monitor

## 2017-05-26 NOTE — Telephone Encounter (Signed)
Pt lvm stating he's never received his monitor

## 2017-05-26 NOTE — Telephone Encounter (Signed)
[id number]  See my phone note from my discussion with Mitch daniel at [employer]

## 2017-05-30 ENCOUNTER — Ambulatory Visit: Payer: PPO | Admitting: Cardiology

## 2017-06-02 ENCOUNTER — Telehealth: Payer: Self-pay

## 2017-06-02 NOTE — Telephone Encounter (Signed)
Received fax from [employer] [facility] regarding event monitor. On 05/31/17. The Message was" Patient decided against study" I will FYI Dr.Branch

## 2017-07-29 DIAGNOSIS — E039 Hypothyroidism, unspecified: Secondary | ICD-10-CM | POA: Diagnosis not present

## 2017-07-29 DIAGNOSIS — G729 Myopathy, unspecified: Secondary | ICD-10-CM | POA: Diagnosis not present

## 2017-07-29 DIAGNOSIS — G629 Polyneuropathy, unspecified: Secondary | ICD-10-CM | POA: Diagnosis not present

## 2017-07-29 DIAGNOSIS — J45909 Unspecified asthma, uncomplicated: Secondary | ICD-10-CM | POA: Diagnosis not present

## 2017-08-21 DIAGNOSIS — R21 Rash and other nonspecific skin eruption: Secondary | ICD-10-CM | POA: Diagnosis not present

## 2017-08-21 DIAGNOSIS — E119 Type 2 diabetes mellitus without complications: Secondary | ICD-10-CM | POA: Diagnosis not present

## 2017-10-29 DIAGNOSIS — E1140 Type 2 diabetes mellitus with diabetic neuropathy, unspecified: Secondary | ICD-10-CM | POA: Diagnosis not present

## 2017-10-29 DIAGNOSIS — G729 Myopathy, unspecified: Secondary | ICD-10-CM | POA: Diagnosis not present

## 2017-10-29 DIAGNOSIS — E039 Hypothyroidism, unspecified: Secondary | ICD-10-CM | POA: Diagnosis not present

## 2017-10-29 DIAGNOSIS — J45909 Unspecified asthma, uncomplicated: Secondary | ICD-10-CM | POA: Diagnosis not present

## 2018-01-27 DIAGNOSIS — G729 Myopathy, unspecified: Secondary | ICD-10-CM | POA: Diagnosis not present

## 2018-01-27 DIAGNOSIS — E1140 Type 2 diabetes mellitus with diabetic neuropathy, unspecified: Secondary | ICD-10-CM | POA: Diagnosis not present

## 2018-01-27 DIAGNOSIS — J45909 Unspecified asthma, uncomplicated: Secondary | ICD-10-CM | POA: Diagnosis not present

## 2018-01-27 DIAGNOSIS — E039 Hypothyroidism, unspecified: Secondary | ICD-10-CM | POA: Diagnosis not present

## 2018-01-29 DIAGNOSIS — Z0000 Encounter for general adult medical examination without abnormal findings: Secondary | ICD-10-CM | POA: Diagnosis not present

## 2018-02-06 DIAGNOSIS — Z1211 Encounter for screening for malignant neoplasm of colon: Secondary | ICD-10-CM | POA: Diagnosis not present

## 2018-02-06 DIAGNOSIS — Z1212 Encounter for screening for malignant neoplasm of rectum: Secondary | ICD-10-CM | POA: Diagnosis not present

## 2018-04-17 ENCOUNTER — Other Ambulatory Visit: Payer: Self-pay

## 2018-04-17 NOTE — Patient Outreach (Signed)
[facility] Care Management  04/17/2018  Justin Klein 07-12-1960 [id number]   Medication Adherence call to Justin Klein spoke with patient he was on his way to work and wants a call back before 8:30 am. [facility] said patient pick up Januvia 100 mg on 04/16/18 for a 30 days supply. Patient is on [employer].   Lillia Abed CPhT Pharmacy Technician [facility] Management Direct Dial [phone]  Fax [phone] [email or web address]

## 2018-04-27 DIAGNOSIS — R05 Cough: Secondary | ICD-10-CM | POA: Diagnosis not present

## 2018-04-27 DIAGNOSIS — E1169 Type 2 diabetes mellitus with other specified complication: Secondary | ICD-10-CM | POA: Diagnosis not present

## 2018-04-27 DIAGNOSIS — J45909 Unspecified asthma, uncomplicated: Secondary | ICD-10-CM | POA: Diagnosis not present

## 2018-04-27 DIAGNOSIS — G629 Polyneuropathy, unspecified: Secondary | ICD-10-CM | POA: Diagnosis not present

## 2018-07-27 DIAGNOSIS — G629 Polyneuropathy, unspecified: Secondary | ICD-10-CM | POA: Diagnosis not present

## 2018-07-27 DIAGNOSIS — E1165 Type 2 diabetes mellitus with hyperglycemia: Secondary | ICD-10-CM | POA: Diagnosis not present

## 2018-07-27 DIAGNOSIS — J45909 Unspecified asthma, uncomplicated: Secondary | ICD-10-CM | POA: Diagnosis not present

## 2018-07-27 DIAGNOSIS — E039 Hypothyroidism, unspecified: Secondary | ICD-10-CM | POA: Diagnosis not present

## 2018-08-19 ENCOUNTER — Telehealth: Payer: Self-pay | Admitting: *Deleted

## 2018-08-19 DIAGNOSIS — G729 Myopathy, unspecified: Secondary | ICD-10-CM | POA: Diagnosis not present

## 2018-08-19 DIAGNOSIS — Z20822 Contact with and (suspected) exposure to covid-19: Secondary | ICD-10-CM

## 2018-08-19 DIAGNOSIS — E039 Hypothyroidism, unspecified: Secondary | ICD-10-CM | POA: Diagnosis not present

## 2018-08-19 DIAGNOSIS — E119 Type 2 diabetes mellitus without complications: Secondary | ICD-10-CM | POA: Diagnosis not present

## 2018-08-19 DIAGNOSIS — J45901 Unspecified asthma with (acute) exacerbation: Secondary | ICD-10-CM | POA: Diagnosis not present

## 2018-08-19 NOTE — Telephone Encounter (Signed)
Pt scheduled for covid testing tomorrow 0800 at [place] site.  Requested by Dr. Sinda Du.  Pt with cough  Pts CB# [phone] Plractice # [phone] IPJ [phone]  Testing process reviewed, stay in car, wear mask; pt verbalizes understanding.

## 2018-08-20 ENCOUNTER — Ambulatory Visit (HOSPITAL_COMMUNITY)
Admission: RE | Admit: 2018-08-20 | Discharge: 2018-08-20 | Disposition: A | Discharge status: Home / Self Care | Payer: PPO | Source: Ambulatory Visit | Attending: Pulmonary Disease | Admitting: Pulmonary Disease

## 2018-08-20 ENCOUNTER — Other Ambulatory Visit: Payer: Self-pay

## 2018-08-20 ENCOUNTER — Other Ambulatory Visit (HOSPITAL_COMMUNITY): Payer: Self-pay | Admitting: Pulmonary Disease

## 2018-08-20 ENCOUNTER — Other Ambulatory Visit: Payer: PPO

## 2018-08-20 DIAGNOSIS — R05 Cough: Secondary | ICD-10-CM | POA: Diagnosis not present

## 2018-08-20 DIAGNOSIS — R6889 Other general symptoms and signs: Secondary | ICD-10-CM | POA: Diagnosis not present

## 2018-08-20 DIAGNOSIS — R059 Cough, unspecified: Secondary | ICD-10-CM

## 2018-08-20 DIAGNOSIS — Z20822 Contact with and (suspected) exposure to covid-19: Secondary | ICD-10-CM

## 2018-08-23 LAB — NOVEL CORONAVIRUS, NAA: SARS-CoV-2, NAA: NOT DETECTED

## 2018-10-08 ENCOUNTER — Other Ambulatory Visit (HOSPITAL_COMMUNITY): Payer: Self-pay | Admitting: Pulmonary Disease

## 2018-10-08 DIAGNOSIS — R609 Edema, unspecified: Secondary | ICD-10-CM

## 2018-10-08 DIAGNOSIS — I10 Essential (primary) hypertension: Secondary | ICD-10-CM | POA: Diagnosis not present

## 2018-10-08 DIAGNOSIS — J45901 Unspecified asthma with (acute) exacerbation: Secondary | ICD-10-CM | POA: Diagnosis not present

## 2018-10-08 DIAGNOSIS — E039 Hypothyroidism, unspecified: Secondary | ICD-10-CM | POA: Diagnosis not present

## 2018-10-12 ENCOUNTER — Other Ambulatory Visit (HOSPITAL_COMMUNITY): Payer: Self-pay | Admitting: Respiratory Therapy

## 2018-10-12 ENCOUNTER — Encounter (HOSPITAL_COMMUNITY): Payer: Self-pay | Admitting: *Deleted

## 2018-10-12 DIAGNOSIS — J441 Chronic obstructive pulmonary disease with (acute) exacerbation: Secondary | ICD-10-CM

## 2018-10-12 NOTE — Progress Notes (Signed)
Patient was referred to our Low-dose CT lung screening program by his primary care physician, Dr. Sinda Du.  Justin Klein is a current smoker of recreational drugs.  He quit smoking cigarettes approximately 8 years ago. He started smoking when he was 8.  He reports only smoking at the most 1/2 pack per day.  His pack years is only 14.5.  This does not qualify him for our program.  Dr. Luan Pulling will be notified.

## 2018-10-16 ENCOUNTER — Ambulatory Visit (HOSPITAL_COMMUNITY)
Admission: RE | Admit: 2018-10-16 | Discharge: 2018-10-16 | Disposition: A | Discharge status: Home / Self Care | Payer: PPO | Source: Ambulatory Visit | Attending: Pulmonary Disease | Admitting: Pulmonary Disease

## 2018-10-16 ENCOUNTER — Other Ambulatory Visit: Payer: Self-pay

## 2018-10-16 ENCOUNTER — Other Ambulatory Visit (HOSPITAL_COMMUNITY): Payer: PPO

## 2018-10-16 DIAGNOSIS — Z86711 Personal history of pulmonary embolism: Secondary | ICD-10-CM | POA: Insufficient documentation

## 2018-10-16 DIAGNOSIS — E119 Type 2 diabetes mellitus without complications: Secondary | ICD-10-CM | POA: Insufficient documentation

## 2018-10-16 DIAGNOSIS — E039 Hypothyroidism, unspecified: Secondary | ICD-10-CM | POA: Insufficient documentation

## 2018-10-16 DIAGNOSIS — I471 Supraventricular tachycardia: Secondary | ICD-10-CM | POA: Insufficient documentation

## 2018-10-16 DIAGNOSIS — R609 Edema, unspecified: Secondary | ICD-10-CM | POA: Diagnosis not present

## 2018-10-16 NOTE — Progress Notes (Signed)
*  PRELIMINARY RESULTS* Echocardiogram 2D Echocardiogram has been performed.  Samuel Germany 10/16/2018, 9:02 AM

## 2018-10-19 ENCOUNTER — Other Ambulatory Visit: Payer: Self-pay

## 2018-10-19 ENCOUNTER — Other Ambulatory Visit (HOSPITAL_COMMUNITY)
Admission: RE | Admit: 2018-10-19 | Discharge: 2018-10-19 | Disposition: A | Discharge status: Home / Self Care | Payer: PPO | Source: Ambulatory Visit | Attending: Pulmonary Disease | Admitting: Pulmonary Disease

## 2018-10-19 DIAGNOSIS — Z01812 Encounter for preprocedural laboratory examination: Secondary | ICD-10-CM | POA: Diagnosis not present

## 2018-10-19 DIAGNOSIS — Z20828 Contact with and (suspected) exposure to other viral communicable diseases: Secondary | ICD-10-CM | POA: Diagnosis not present

## 2018-10-20 LAB — SARS CORONAVIRUS 2 (TAT 6-24 HRS): SARS Coronavirus 2: NEGATIVE

## 2018-10-21 ENCOUNTER — Other Ambulatory Visit: Payer: Self-pay

## 2018-10-21 ENCOUNTER — Ambulatory Visit (HOSPITAL_COMMUNITY)
Admission: RE | Admit: 2018-10-21 | Discharge: 2018-10-21 | Disposition: A | Discharge status: Home / Self Care | Payer: PPO | Source: Ambulatory Visit | Attending: Pulmonary Disease | Admitting: Pulmonary Disease

## 2018-10-21 DIAGNOSIS — J441 Chronic obstructive pulmonary disease with (acute) exacerbation: Secondary | ICD-10-CM | POA: Insufficient documentation

## 2018-10-21 LAB — PULMONARY FUNCTION TEST
DL/VA % pred: 95 %
DL/VA: 4.08 ml/min/mmHg/L
DLCO unc % pred: 85 %
DLCO unc: 23.17 ml/min/mmHg
FEF 25-75 Post: 2.85 L/sec
FEF 25-75 Pre: 2.5 L/sec
FEF2575-%Change-Post: 13 %
FEF2575-%Pred-Post: 98 %
FEF2575-%Pred-Pre: 86 %
FEV1-%Change-Post: 6 %
FEV1-%Pred-Post: 98 %
FEV1-%Pred-Pre: 93 %
FEV1-Post: 3.02 L
FEV1-Pre: 2.84 L
FEV1FVC-%Change-Post: 0 %
FEV1FVC-%Pred-Pre: 99 %
FEV6-%Change-Post: 5 %
FEV6-%Pred-Post: 100 %
FEV6-%Pred-Pre: 94 %
FEV6-Post: 3.77 L
FEV6-Pre: 3.56 L
FEV6FVC-%Change-Post: -1 %
FEV6FVC-%Pred-Post: 100 %
FEV6FVC-%Pred-Pre: 101 %
FVC-%Change-Post: 7 %
FVC-%Pred-Post: 99 %
FVC-%Pred-Pre: 93 %
FVC-Post: 3.89 L
FVC-Pre: 3.63 L
Post FEV1/FVC ratio: 78 %
Post FEV6/FVC ratio: 97 %
Pre FEV1/FVC ratio: 78 %
Pre FEV6/FVC Ratio: 98 %

## 2018-10-21 MED ORDER — ALBUTEROL SULFATE (2.5 MG/3ML) 0.083% IN NEBU
2.5000 mg | INHALATION_SOLUTION | Freq: Once | RESPIRATORY_TRACT | Status: AC
  Administered 2018-10-21: 08:00:00 2.5 mg via RESPIRATORY_TRACT

## 2018-10-28 DIAGNOSIS — J45901 Unspecified asthma with (acute) exacerbation: Secondary | ICD-10-CM | POA: Diagnosis not present

## 2018-10-28 DIAGNOSIS — E1165 Type 2 diabetes mellitus with hyperglycemia: Secondary | ICD-10-CM | POA: Diagnosis not present

## 2018-10-28 DIAGNOSIS — G629 Polyneuropathy, unspecified: Secondary | ICD-10-CM | POA: Diagnosis not present

## 2018-10-28 DIAGNOSIS — G729 Myopathy, unspecified: Secondary | ICD-10-CM | POA: Diagnosis not present

## 2019-01-27 DIAGNOSIS — D649 Anemia, unspecified: Secondary | ICD-10-CM | POA: Diagnosis not present

## 2019-01-27 DIAGNOSIS — G729 Myopathy, unspecified: Secondary | ICD-10-CM | POA: Diagnosis not present

## 2019-01-27 DIAGNOSIS — Z0000 Encounter for general adult medical examination without abnormal findings: Secondary | ICD-10-CM | POA: Diagnosis not present

## 2019-01-27 DIAGNOSIS — E1165 Type 2 diabetes mellitus with hyperglycemia: Secondary | ICD-10-CM | POA: Diagnosis not present

## 2019-01-27 DIAGNOSIS — E039 Hypothyroidism, unspecified: Secondary | ICD-10-CM | POA: Diagnosis not present

## 2019-01-27 DIAGNOSIS — J45909 Unspecified asthma, uncomplicated: Secondary | ICD-10-CM | POA: Diagnosis not present

## 2019-01-27 DIAGNOSIS — K760 Fatty (change of) liver, not elsewhere classified: Secondary | ICD-10-CM | POA: Diagnosis not present

## 2019-01-28 ENCOUNTER — Other Ambulatory Visit (HOSPITAL_COMMUNITY): Payer: Self-pay | Admitting: Pulmonary Disease

## 2019-01-28 DIAGNOSIS — M79672 Pain in left foot: Secondary | ICD-10-CM

## 2019-01-29 ENCOUNTER — Ambulatory Visit (HOSPITAL_COMMUNITY)
Admission: RE | Admit: 2019-01-29 | Discharge: 2019-01-29 | Disposition: A | Discharge status: Home / Self Care | Payer: PPO | Source: Ambulatory Visit | Attending: Pulmonary Disease | Admitting: Pulmonary Disease

## 2019-01-29 ENCOUNTER — Other Ambulatory Visit: Payer: Self-pay

## 2019-01-29 DIAGNOSIS — M79672 Pain in left foot: Secondary | ICD-10-CM | POA: Diagnosis not present

## 2019-04-28 ENCOUNTER — Ambulatory Visit: Payer: PPO | Admitting: Family Medicine

## 2019-05-07 ENCOUNTER — Ambulatory Visit (INDEPENDENT_AMBULATORY_CARE_PROVIDER_SITE_OTHER): Payer: PPO | Admitting: Family Medicine

## 2019-05-07 ENCOUNTER — Encounter: Payer: Self-pay | Admitting: Family Medicine

## 2019-05-07 ENCOUNTER — Other Ambulatory Visit: Payer: Self-pay

## 2019-05-07 VITALS — BP 120/78 | HR 82 | Temp 97.1°F | Resp 16 | Ht 69.0 in | Wt 209.0 lb

## 2019-05-07 DIAGNOSIS — L600 Ingrowing nail: Secondary | ICD-10-CM

## 2019-05-07 DIAGNOSIS — E118 Type 2 diabetes mellitus with unspecified complications: Secondary | ICD-10-CM

## 2019-05-07 DIAGNOSIS — E669 Obesity, unspecified: Secondary | ICD-10-CM | POA: Diagnosis not present

## 2019-05-07 DIAGNOSIS — E039 Hypothyroidism, unspecified: Secondary | ICD-10-CM

## 2019-05-07 DIAGNOSIS — J4520 Mild intermittent asthma, uncomplicated: Secondary | ICD-10-CM

## 2019-05-07 MED ORDER — ALBUTEROL SULFATE HFA 108 (90 BASE) MCG/ACT IN AERS: 1.0000 | INHALATION_SPRAY | Freq: Four times a day (QID) | RESPIRATORY_TRACT | 0 refills | Status: DC | PRN

## 2019-05-07 NOTE — Progress Notes (Signed)
Subjective:  Patient ID: Justin Klein, male    DOB: [birth year]  Age: 59 y.o. MRN: [id number]  CC:  Chief Complaint  Patient presents with  . Establish Care  . Ingrown Toenail    left great toe, puffy, red and sore. Thinks he has ingrown toenail       HPI  HPI  Justin Klein is a 59 year old male patient who presents today to establish care.  Was previously a patient of Dr. Luan Pulling as well.  Has had some issue with insurance company regarding his diabetic medications and reports that he might want to go back on Tradjenta if that is better cost wise as he reports that he thought he had better outcomes on it.  He likes to go to [employer] as well.  And wants that to be known.  He has a significant history of type 2 diabetes, obesity, asthma, PSVT.  He reports taking all his medications as directed and without issue.  His last colonoscopy was in 2013/14 per him.  Advised to follow-up in 10 years.  He did Cologuard 1 years ago and reported that it was negative. He sees a Pharmacist, community several times a year.  And sees an eye Dr. Marvel Plan in [place] and has new glasses.  Needs refill of his medications and he knows he needs updated labs as well.  He also wants to report that he has an ongoing ingrown toenail of his left great toe would like to see the podiatrist again if available.  He reports that is been going on for about 2 weeks.  Is the left side only.  Soreness pressure makes it worse.  Has been on medication in the past to help with this.  He reports a discomfort of 3-4 out of 10.    He denies having any other issues or concerns today.  Today patient denies signs and symptoms of COVID 19 infection including fever, chills, cough, shortness of breath, and headache. Past Medical, Surgical, Social History, Allergies, and Medications have been Reviewed.   Past Medical History:  Diagnosis Date  . Asthma   . Bilateral leg weakness 07/21/2012  . Diabetes mellitus, type 2 ([place])    . Hypothyroidism   . Numbness and tingling of both legs 07/21/2012  . Pernicious anemia   . Pulmonary embolus ([place])    Diagnosed 9/12 at [facility]  . Pulmonary embolus ([place]) 08/26/2011  . Seborrheic dermatitis   . SVT (supraventricular tachycardia) (HCC)     Current Meds  Medication Sig  . JANUVIA 100 MG tablet Take 100 mg by mouth daily.  Marland Kitchen levothyroxine (SYNTHROID) 150 MCG tablet Take 150 mcg by mouth every morning.  . [DISCONTINUED] sildenafil (VIAGRA) 50 MG tablet Take 50 mg by mouth as needed.    ROS:  Review of Systems  Constitutional: Negative.   HENT: Negative.   Eyes: Negative.   Respiratory: Negative.   Cardiovascular: Negative.   Gastrointestinal: Negative.   Genitourinary: Negative.   Musculoskeletal: Negative.   Skin: Negative.        Ingrown toe nail Left great toe  Neurological: Negative.   Endo/Heme/Allergies: Negative.   Psychiatric/Behavioral: Negative.   All other systems reviewed and are negative.    Objective:   Today's Vitals: BP 120/78   Pulse 82   Temp (!) 97.1 F (36.2 C) (Temporal)   Resp 16   Ht 5\' 9"  (1.753 m)   Wt 209 lb (94.8 kg)   SpO2 97%   BMI  30.86 kg/m  Vitals with BMI 05/07/2019 08/26/2011  Height 5\' 9"  5\' 9"   Weight 209 lbs 206 lbs  BMI 30.85 30.5  Systolic 120 128  Diastolic 78 82  Pulse 82 69     Physical Exam Vitals and nursing note reviewed.  Constitutional:      Appearance: Normal appearance. He is well-developed and well-groomed. He is obese.  HENT:     Head: Normocephalic and atraumatic.     Right Ear: External ear normal.     Left Ear: External ear normal.     Nose: Nose normal.     Mouth/Throat:     Mouth: Mucous membranes are moist.     Pharynx: Oropharynx is clear.  Eyes:     General:        Right eye: No discharge.        Left eye: No discharge.     Conjunctiva/sclera: Conjunctivae normal.  Cardiovascular:     Rate and Rhythm: Normal rate and regular rhythm.     Pulses: Normal pulses.     Heart  sounds: Normal heart sounds.  Pulmonary:     Effort: Pulmonary effort is normal.     Breath sounds: Normal breath sounds.  Musculoskeletal:        General: Normal range of motion.     Cervical back: Normal range of motion and neck supple.  Skin:    General: Skin is warm.  Neurological:     General: No focal deficit present.     Mental Status: He is alert and oriented to person, place, and time.  Psychiatric:        Attention and Perception: Attention normal.        Mood and Affect: Mood normal.        Speech: Speech normal.        Behavior: Behavior normal. Behavior is cooperative.        Thought Content: Thought content normal.        Cognition and Memory: Cognition normal.        Judgment: Judgment normal.     Assessment   1. Controlled type 2 diabetes mellitus with complication, without long-term current use of insulin (HCC)   2. Obesity (BMI 30-39.9)   3. Mild intermittent asthma without complication   4. Hypothyroidism, unspecified type   5. Ingrown nail of great toe of left foot     Tests ordered Orders Placed This Encounter  Procedures  . CBC  . Hemoglobin A1c  . Lipid panel  . Comprehensive Metabolic Panel (CMET)  . TSH  . Ambulatory referral to Podiatry      Plan: Please see assessment and plan per problem list above.   Meds ordered this encounter  Medications  . albuterol (VENTOLIN HFA) 108 (90 Base) MCG/ACT inhaler    Sig: Inhale 1-2 puffs into the lungs every 6 (six) hours as needed for wheezing or shortness of breath.    Dispense:  8 g    Refill:  0    Order Specific Question:   Supervising Provider    Answer:   [[id number]]    Patient to follow-up in 3 months  , NP

## 2019-05-07 NOTE — Patient Instructions (Signed)
I appreciate the opportunity to provide you with care for your health and wellness. Today we discussed: establish   Follow up: 3 months   Labs today  Referral for podiatry  Please continue to practice social distancing to keep you, your family, and our community safe.  If you must go out, please wear a mask and practice good handwashing.  It was a pleasure to see you and I look forward to continuing to work together on your health and well-being. Please do not hesitate to call the office if you need care or have questions about your care.  Have a wonderful day and week. With Gratitude, Tereasa Coop, DNP, AGNP-BC

## 2019-05-08 LAB — CBC
Hematocrit: 43.6 % (ref 37.5–51.0)
Hemoglobin: 14.1 g/dL (ref 13.0–17.7)
MCH: 28.7 pg (ref 26.6–33.0)
MCHC: 32.3 g/dL (ref 31.5–35.7)
MCV: 89 fL (ref 79–97)
Platelets: 240 10*3/uL (ref 150–450)
RBC: 4.92 x10E6/uL (ref 4.14–5.80)
RDW: 14.5 % (ref 11.6–15.4)
WBC: 4.6 10*3/uL (ref 3.4–10.8)

## 2019-05-08 LAB — COMPREHENSIVE METABOLIC PANEL
ALT: 24 IU/L (ref 0–44)
AST: 31 IU/L (ref 0–40)
Albumin/Globulin Ratio: 1.5 (ref 1.2–2.2)
Albumin: 4.5 g/dL (ref 3.8–4.9)
Alkaline Phosphatase: 83 IU/L (ref 39–117)
BUN/Creatinine Ratio: 11 (ref 9–20)
BUN: 13 mg/dL (ref 6–24)
Bilirubin Total: 0.5 mg/dL (ref 0.0–1.2)
CO2: 22 mmol/L (ref 20–29)
Calcium: 10.3 mg/dL — ABNORMAL HIGH (ref 8.7–10.2)
Chloride: 104 mmol/L (ref 96–106)
Creatinine, Ser: 1.18 mg/dL (ref 0.76–1.27)
GFR calc Af Amer: 78 mL/min/{1.73_m2} (ref >59)
GFR calc non Af Amer: 68 mL/min/{1.73_m2} (ref >59)
Globulin, Total: 3 g/dL (ref 1.5–4.5)
Glucose: 102 mg/dL — ABNORMAL HIGH (ref 65–99)
Potassium: 4.7 mmol/L (ref 3.5–5.2)
Sodium: 141 mmol/L (ref 134–144)
Total Protein: 7.5 g/dL (ref 6.0–8.5)

## 2019-05-08 LAB — HEMOGLOBIN A1C
Est. average glucose Bld gHb Est-mCnc: 128 mg/dL
Hgb A1c MFr Bld: 6.1 % — ABNORMAL HIGH (ref 4.8–5.6)

## 2019-05-08 LAB — LIPID PANEL
Chol/HDL Ratio: 4.3 ratio (ref 0.0–5.0)
Cholesterol, Total: 202 mg/dL — ABNORMAL HIGH (ref 100–199)
HDL: 47 mg/dL (ref >39)
LDL Chol Calc (NIH): 142 mg/dL — ABNORMAL HIGH (ref 0–99)
Triglycerides: 69 mg/dL (ref 0–149)
VLDL Cholesterol Cal: 13 mg/dL (ref 5–40)

## 2019-05-10 ENCOUNTER — Telehealth: Payer: Self-pay | Admitting: *Deleted

## 2019-05-10 ENCOUNTER — Other Ambulatory Visit: Payer: Self-pay | Admitting: Family Medicine

## 2019-05-10 DIAGNOSIS — J4520 Mild intermittent asthma, uncomplicated: Secondary | ICD-10-CM

## 2019-05-10 MED ORDER — FLUTICASONE FUROATE-VILANTEROL 100-25 MCG/INH IN AEPB: 1.0000 | INHALATION_SPRAY | Freq: Every day | RESPIRATORY_TRACT | 3 refills | Status: DC

## 2019-05-10 NOTE — Telephone Encounter (Signed)
Pt called wanting to let Dahlia Client know that his Breoelipta is 100 mg

## 2019-05-10 NOTE — Telephone Encounter (Signed)
.  BLOCKNOTESHARINGWITHPATIENT

## 2019-05-11 ENCOUNTER — Encounter: Payer: Self-pay | Admitting: Family Medicine

## 2019-05-11 DIAGNOSIS — E669 Obesity, unspecified: Secondary | ICD-10-CM | POA: Insufficient documentation

## 2019-05-11 DIAGNOSIS — L600 Ingrowing nail: Secondary | ICD-10-CM | POA: Insufficient documentation

## 2019-05-11 DIAGNOSIS — E1140 Type 2 diabetes mellitus with diabetic neuropathy, unspecified: Secondary | ICD-10-CM | POA: Insufficient documentation

## 2019-05-11 DIAGNOSIS — E039 Hypothyroidism, unspecified: Secondary | ICD-10-CM | POA: Insufficient documentation

## 2019-05-11 DIAGNOSIS — E118 Type 2 diabetes mellitus with unspecified complications: Secondary | ICD-10-CM | POA: Insufficient documentation

## 2019-05-11 HISTORY — DX: Ingrowing nail: L60.0

## 2019-05-11 NOTE — Assessment & Plan Note (Signed)
Justin Klein is encouraged to check blood sugar daily as directed. Continue current medications. Is not on a statin medication at this time.  Provided with extensive education regarding statin medications in diabetics.  Even if cholesterol levels are not above normal range.  Will be getting updated labs. Educated on importance of maintain a well balanced diabetic friendly diet. He is reminded the importance of maintaining  good blood sugars,  taking medications as directed, daily foot care, annual eye exams. Additionally educated about keeping good control over blood pressure and cholesterol as well.

## 2019-05-11 NOTE — Assessment & Plan Note (Signed)
  Justin Klein is educated about the importance of exercise daily to help with weight management. A minumum of 30 minutes daily is recommended. Additionally, importance of healthy food choices  with portion control discussed.  Wt Readings from Last 3 Encounters:  05/07/19 209 lb (94.8 kg)  08/26/11 206 lb (93.4 kg)

## 2019-05-11 NOTE — Assessment & Plan Note (Signed)
Provided with inhaler.  Appears to be adequately controlled at this time.  Does use Breo as well refill sent in.

## 2019-05-11 NOTE — Assessment & Plan Note (Signed)
Is on Synthroid at this time.  Continue current dose we will give labs to see if any adjustments are needed.

## 2019-05-11 NOTE — Assessment & Plan Note (Addendum)
Referral to podiatry for ingrown toenail treatment.  Left great toe.  Has been seen by podiatry in the past.  I appreciate collaboration in his care.

## 2019-05-27 ENCOUNTER — Other Ambulatory Visit: Payer: Self-pay | Admitting: Family Medicine

## 2019-05-27 ENCOUNTER — Telehealth: Payer: Self-pay | Admitting: *Deleted

## 2019-05-27 DIAGNOSIS — N529 Male erectile dysfunction, unspecified: Secondary | ICD-10-CM

## 2019-05-27 MED ORDER — SILDENAFIL CITRATE 100 MG PO TABS: 100.0000 mg | ORAL_TABLET | ORAL | 1 refills | Status: DC | PRN

## 2019-05-27 NOTE — Telephone Encounter (Signed)
Noted, I have refilled and provided with one refill.

## 2019-05-27 NOTE — Telephone Encounter (Signed)
Pt wanted a refill on sildinafil 100 mg he said that it was ordered 3 times a week but he takes this everyday if he has them. Wanted more to be sent in that what he is receiving.

## 2019-06-09 ENCOUNTER — Other Ambulatory Visit: Payer: Self-pay

## 2019-06-09 ENCOUNTER — Ambulatory Visit: Payer: PPO | Admitting: Podiatry

## 2019-06-09 DIAGNOSIS — L603 Nail dystrophy: Secondary | ICD-10-CM

## 2019-06-09 MED ORDER — GENTAMICIN SULFATE 0.1 % EX CREA: 1.0000 "application " | TOPICAL_CREAM | Freq: Two times a day (BID) | CUTANEOUS | 1 refills | Status: DC

## 2019-06-16 NOTE — Progress Notes (Signed)
   Subjective: 59 y.o. male presenting today with a chief complaint of sharp, throbbing pain to the left great toenail that began about one month ago. He reports associated swelling and discoloration of the toe. Walking and wearing shoes increases the pain. He has been trimming the nail himself for treatment. Patient is here for further evaluation and treatment.   Past Medical History:  Diagnosis Date  . Asthma   . Bilateral leg weakness 07/21/2012  . Diabetes mellitus, type 2 (HCC)   . Hypothyroidism   . Numbness and tingling of both legs 07/21/2012  . Pernicious anemia   . Pulmonary embolus (HCC)    Diagnosed 9/12 at [facility]  . Pulmonary embolus (HCC) 08/26/2011  . Seborrheic dermatitis   . SVT (supraventricular tachycardia) (HCC)     Objective:  General: Well developed, nourished, in no acute distress, alert and oriented x3   Dermatology: Hyperkeratotic, discolored, thickened, onychodystrophy of the left great toenail. Skin is warm, dry and supple bilateral lower extremities. Negative for open lesions or macerations.  Vascular: Dorsalis Pedis artery and Posterior Tibial artery pedal pulses palpable. No lower extremity edema noted.   Neruologic: Grossly intact via light touch bilateral.  Musculoskeletal: Muscular strength within normal limits in all groups bilateral. Normal range of motion noted to all pedal and ankle joints.   Assessment:  #1 Dystrophic nail of the left hallux  Plan of Care:  1. Patient evaluated.  2. Discussed treatment alternatives and plan of care. Explained nail avulsion procedure and post procedure course to patient. 3. Patient opted for total temporary nail avulsion of the left great toenail.  4. Prior to procedure, local anesthesia infiltration utilized using 3 ml of a 50:50 mixture of 2% plain lidocaine and 0.5% plain marcaine in a normal hallux block fashion and a betadine prep performed.  5. Light dressing applied. 6. Prescription for Gentamicin cream  provided to patient to use daily with a bandage.  7. Return to clinic as needed.   Felecia Shelling, DPM [facility]  Dr. Felecia Shelling, DPM    [address]                                        [place], [place] [address]                Office [phone]  Fax [phone]

## 2019-06-23 ENCOUNTER — Ambulatory Visit: Payer: PPO | Admitting: Family Medicine

## 2019-08-20 ENCOUNTER — Encounter: Payer: Self-pay | Admitting: Family Medicine

## 2019-08-20 ENCOUNTER — Ambulatory Visit (INDEPENDENT_AMBULATORY_CARE_PROVIDER_SITE_OTHER): Payer: PPO | Admitting: Family Medicine

## 2019-08-20 VITALS — BP 128/77 | HR 63 | Temp 97.8°F | Ht 69.0 in | Wt 214.4 lb

## 2019-08-20 DIAGNOSIS — N529 Male erectile dysfunction, unspecified: Secondary | ICD-10-CM | POA: Insufficient documentation

## 2019-08-20 DIAGNOSIS — E039 Hypothyroidism, unspecified: Secondary | ICD-10-CM | POA: Diagnosis not present

## 2019-08-20 DIAGNOSIS — E669 Obesity, unspecified: Secondary | ICD-10-CM

## 2019-08-20 DIAGNOSIS — B079 Viral wart, unspecified: Secondary | ICD-10-CM | POA: Insufficient documentation

## 2019-08-20 DIAGNOSIS — E1140 Type 2 diabetes mellitus with diabetic neuropathy, unspecified: Secondary | ICD-10-CM | POA: Diagnosis not present

## 2019-08-20 HISTORY — DX: Viral wart, unspecified: B07.9

## 2019-08-20 MED ORDER — SILDENAFIL CITRATE 100 MG PO TABS: 100.0000 mg | ORAL_TABLET | ORAL | 1 refills | Status: DC | PRN

## 2019-08-20 MED ORDER — SALICYLIC ACID 17.6 % EX LIQD: 1.0000 "application " | Freq: Every day | CUTANEOUS | 0 refills | Status: DC

## 2019-08-20 NOTE — Assessment & Plan Note (Signed)
Deteriorated, he realized that he was put on some weight.  He is currently trying to do better with this with diet and lifestyle changes.  Wt Readings from Last 3 Encounters:  08/20/19 214 lb 6.4 oz (97.3 kg)  05/07/19 209 lb (94.8 kg)  08/26/11 206 lb (93.4 kg)

## 2019-08-20 NOTE — Assessment & Plan Note (Signed)
Salicylic Acid prescribed for treatment at home. No signs of infection.

## 2019-08-20 NOTE — Assessment & Plan Note (Signed)
Is on Synthroid at this time. We will get TSH level at next check to see if adjustments are needed.  No signs or symptoms of hypothyroidism at this time.

## 2019-08-20 NOTE — Patient Instructions (Signed)
I appreciate the opportunity to provide you with care for your health and wellness. Today we discussed: wart and overall health   Follow up: 6 months for annual appt-A1c in office-fasting will get labs that day  No labs or referrals today  Please apply the topical medication once daily to clean skin. Avoid applying around the site to healthy skin.  Please continue to practice social distancing to keep you, your family, and our community safe.  If you must go out, please wear a mask and practice good handwashing.  It was a pleasure to see you and I look forward to continuing to work together on your health and well-being. Please do not hesitate to call the office if you need care or have questions about your care.  Have a wonderful day and week. With Gratitude, Tereasa Coop, DNP, AGNP-BC    Warts  Warts are small growths on the skin. They are common, and they are caused by a virus. Warts can be found on many parts of the body. A person may have one wart or many warts. Most warts will go away on their own with time, but this could take many months to a few years. Treatments may be done if needed. What are the causes? Warts are caused by a type of virus that is called HPV.  This virus can spread from person to person through touching.  Warts can also spread to other parts of the body when a person scratches a wart and then scratches normal skin. What increases the risk? You are more likely to get warts if:  You are 66-42 years old.  You have a weak body defense system (immune system).  You are Caucasian. What are the signs or symptoms? The main symptom of this condition is small growths on the skin. Warts may:  Be round, oval, or have an uneven shape.  Feel rough to the touch.  Be the color of your skin or light yellow, brown, or gray.  Often be less than  inch (1.3 cm) in size.  Go away and then come back again. Most warts do not hurt, but some can hurt if they are  large or if they are on the bottom of your feet. How is this diagnosed? A wart can often be diagnosed by how it looks. In some cases, the doctor might remove a little bit of the wart to test it (biopsy). How is this treated? Most of the time, warts do not need treatment. Sometimes people want warts removed. If treatment is needed or wanted, options may include:  Putting creams or patches with medicine in them on the wart.  Putting duct tape over the top of the wart.  Freezing the wart.  Burning the wart with: ? A laser. ? An electric probe.  Giving a shot of medicine into the wart to help the body's defense system fight off the wart.  Surgery to remove the wart. Follow these instructions at home:  Medicines  Apply over-the-counter and prescription medicines only as told by your doctor.  Do not apply over-the-counter wart medicines to your face or genitals before you ask your doctor if it is okay to do that. Lifestyle  Keep your body's defense system healthy. To do this: ? Eat a healthy diet. ? Get enough sleep. ? Do not use any products that contain nicotine or tobacco, such as cigarettes and e-cigarettes. If you need help quitting, ask your doctor. General instructions  Wash your hands after you  touch a wart.  Do not scratch or pick at a wart.  Avoid shaving hair that is over a wart.  Keep all follow-up visits as told by your doctor. This is important. Contact a doctor if:  Your warts do not get better after treatment.  You have redness, swelling, or pain at the site of a wart.  You have bleeding from a wart, and the bleeding does not stop when you put light pressure on the wart.  You have diabetes and you get a wart. Summary  Warts are small growths on the skin. They are common, and they are caused by a virus.  Most of the time, warts do not need treatment. Sometimes people want warts removed. If treatment is needed or wanted, there are many options.  Apply  over-the-counter and prescription medicines only as told by your doctor.  Wash your hands after you touch a wart.  Keep all follow-up visits as told by your doctor. This is important. This information is not intended to replace advice given to you by your health care provider. Make sure you discuss any questions you have with your health care provider. Document Revised: 07/01/2017 Document Reviewed: 07/01/2017 Elsevier Patient Education  [employer].

## 2019-08-20 NOTE — Assessment & Plan Note (Signed)
Refill of Viagra provided 

## 2019-08-20 NOTE — Assessment & Plan Note (Addendum)
Justin Klein is encouraged to check blood sugar daily as directed. Continue current medications. Is not on statin at this time.  Would like to wait until next lab check before starting Declined PT at this time for increased falls.  He is reminded the importance of maintaining  good blood sugars,  taking medications as directed, daily foot care, annual eye exams. Additionally educated about keeping good control over blood pressure and cholesterol as well.

## 2019-08-20 NOTE — Progress Notes (Signed)
Subjective:  Patient ID: Justin Klein, male    DOB: 1960-12-05  Age: 59 y.o. MRN: [id number]  CC:  Chief Complaint  Patient presents with  . Follow-up    3 month follow up has a concern about some bumps that popped up was diagnose with shingles 1.5 years ago but they were all on one side these are on left thumb right neck and middle of chest       HPI  HPI  Justin Klein is a 59 year old male patient of mine.  He presents today for 81-month follow-up.  Reports that he has some questionable bumps that have popped up over his skin and he is unsure what they are.  He reports that in the past when he was a patient of Justin Klein he was diagnosed with shingles that was not distributed in a band formation but rather just single bumps on the left side of his body.  He does not recall having chickenpox when he was little.  So he was not sure that he can get the shingles.  At least he does not remember having the chickenpox per him.  Today he reports that he has a bump on the side of his left thumb, right side of his neck, center of his chest underneath the nipple line.  He reports that they were itchy when they first.  He reports His neck formed a little bit of a head and then had pus drainage.  He reports one of his chest was very similar.  Both of those have appeared to start clearing up some.  He reports that he thinks it might have been sweat bees as he has been outside frequently.  The one on his thumb even though it came up at the same time it has not started to go away.  Has a raised area white center that is hard in nature.  He is questioning whether or not it might be a wart.  He reports that he is having balance issues.  Needs to be using his cane but he does not have one.  He reports he falls regularly because of this.  He reports that this is related to having anemia, hypothyroidism and diabetes all diagnosed within 1 timeframe.  And he never really got better with his nurse changes.  He  declines PT today.  Denies having chest pain, leg swelling, headaches, vision changes, hearing changes, palpitations.  Today patient denies signs and symptoms of COVID 19 infection including fever, chills, cough, shortness of breath, and headache. Past Medical, Surgical, Social History, Allergies, and Medications have been Reviewed.   Past Medical History:  Diagnosis Date  . Asthma   . Bilateral leg weakness 07/21/2012  . Diabetes mellitus, type 2 (HCC)   . Hypothyroidism   . Ingrown nail of great toe of left foot 05/11/2019  . Numbness and tingling of both legs 07/21/2012  . Pernicious anemia   . PSVT (paroxysmal supraventricular tachycardia) (HCC) 08/26/2011  . Pulmonary embolus (HCC)    Diagnosed 9/12 at [facility]  . Pulmonary embolus (HCC) 08/26/2011  . Seborrheic dermatitis   . SVT (supraventricular tachycardia) (HCC)     Current Meds  Medication Sig  . diltiazem (CARDIZEM) 30 MG tablet Take by mouth as needed.  . fluticasone (CUTIVATE) 0.05 % cream APPLY TO AFFECTED AREASCON FACE UP TO TWICE DAILYTAS NEEDED FOR RASH.  Marland Kitchen JANUVIA 100 MG tablet Take 100 mg by mouth daily.  . Lancets (ONETOUCH DELICA PLUS LANCET33G)  MISC USE AS DIRECTED THREE TODFOUR TIMES DAILY.  Marland Kitchen levothyroxine (SYNTHROID) 150 MCG tablet Take 150 mcg by mouth every morning.  Marland Kitchen lisinopril (ZESTRIL) 5 MG tablet Take 5 mg by mouth daily.  Letta Pate ULTRA test strip USE TO TEST BLOOD SUGARFTWICE DAILY.  . sildenafil (VIAGRA) 100 MG tablet Take 1 tablet (100 mg total) by mouth as needed for erectile dysfunction.  . [DISCONTINUED] Cholecalciferol 50 MCG (2000 UT) TABS Take by mouth.  . [DISCONTINUED] cyanocobalamin 1000 MCG tablet Take by mouth.  . [DISCONTINUED] sildenafil (VIAGRA) 100 MG tablet Take 1 tablet (100 mg total) by mouth as needed for erectile dysfunction.    ROS:  Review of Systems  Constitutional: Negative.   HENT: Negative.   Eyes: Negative.   Respiratory: Negative.   Cardiovascular: Negative.     Gastrointestinal: Negative.   Genitourinary: Negative.   Musculoskeletal: Positive for falls.  Skin: Negative.        Wart to other unspecified spots  Neurological: Positive for sensory change.  Endo/Heme/Allergies: Negative.   Psychiatric/Behavioral: Negative.   All other systems reviewed and are negative.    Objective:   Today's Vitals: BP 128/77 (BP Location: Right Arm, Patient Position: Sitting, Cuff Size: Normal)   Pulse 63   Temp 97.8 F (36.6 C) (Temporal)   Ht 5\' 9"  (1.753 m)   Wt 214 lb 6.4 oz (97.3 kg)   SpO2 99%   BMI 31.66 kg/m  Vitals with BMI 08/20/2019 05/07/2019 08/26/2011  Height 5\' 9"  5\' 9"  5\' 9"   Weight 214 lbs 6 oz 209 lbs 206 lbs  BMI 31.65 30.85 30.5  Systolic 128 120 10/27/2011  Diastolic 77 78 82  Pulse 63 82 69     Physical Exam Vitals and nursing note reviewed.  Constitutional:      Appearance: Normal appearance. He is obese.  HENT:     Head: Normocephalic and atraumatic.     Right Ear: External ear normal.     Left Ear: External ear normal.     Mouth/Throat:     Comments: Mask in place Eyes:     General:        Right eye: No discharge.        Left eye: No discharge.     Conjunctiva/sclera: Conjunctivae normal.  Cardiovascular:     Rate and Rhythm: Normal rate and regular rhythm.     Pulses: Normal pulses.     Heart sounds: Normal heart sounds.  Pulmonary:     Effort: Pulmonary effort is normal.     Breath sounds: Normal breath sounds.  Musculoskeletal:     Cervical back: Normal range of motion and neck supple.  Skin:    General: Skin is warm.     Comments: Noted common wart on right thumb, medial aspect posterior to anterior side  Noted healed questionable insect bite/folliculitis on right lateral aspect of neck  Healed questionable insect bite/ingrown hair/pimple on central aspect of sternum below nipple line  Neurological:     General: No focal deficit present.     Mental Status: He is alert and oriented to person, place, and time.   Psychiatric:        Mood and Affect: Mood normal.        Behavior: Behavior normal.        Thought Content: Thought content normal.        Judgment: Judgment normal.      Assessment   1. Type 2 diabetes mellitus with diabetic neuropathy, without  long-term current use of insulin ([place])   2. Hypothyroidism, unspecified type   3. Obesity (BMI 30.0-34.9)   4. Erectile dysfunction, unspecified erectile dysfunction type   5. Wart on thumb     Tests ordered No orders of the defined types were placed in this encounter.    Plan: Please see assessment and plan per problem list above.   Meds ordered this encounter  Medications  . sildenafil (VIAGRA) 100 MG tablet    Sig: Take 1 tablet (100 mg total) by mouth as needed for erectile dysfunction.    Dispense:  20 tablet    Refill:  1    Order Specific Question:   Supervising Provider    Answer:   SIMPSON, MARGARET E [[id number]]  . Salicylic Acid 27.0 % LIQD    Sig: Apply 1 application topically daily.    Dispense:  9.3 mL    Refill:  0    Order Specific Question:   Supervising Provider    Answer:   Fayrene Helper [[id number]]    Patient to follow-up in 6 months   Perlie Mayo, NP

## 2019-10-05 ENCOUNTER — Telehealth: Payer: Self-pay | Admitting: Family Medicine

## 2019-10-05 ENCOUNTER — Other Ambulatory Visit: Payer: Self-pay | Admitting: *Deleted

## 2019-10-05 DIAGNOSIS — N529 Male erectile dysfunction, unspecified: Secondary | ICD-10-CM

## 2019-10-05 MED ORDER — SILDENAFIL CITRATE 100 MG PO TABS: 100.0000 mg | ORAL_TABLET | ORAL | 1 refills | Status: DC | PRN

## 2019-10-05 NOTE — Telephone Encounter (Signed)
Patient wants a refill on Sildenafil sent to [facility]

## 2019-10-05 NOTE — Telephone Encounter (Signed)
Refill sent to pt pharmacy per request

## 2019-10-27 ENCOUNTER — Other Ambulatory Visit: Payer: Self-pay

## 2019-10-27 ENCOUNTER — Other Ambulatory Visit: Payer: Self-pay | Admitting: *Deleted

## 2019-10-27 ENCOUNTER — Encounter: Payer: Self-pay | Admitting: Family Medicine

## 2019-10-27 ENCOUNTER — Ambulatory Visit (INDEPENDENT_AMBULATORY_CARE_PROVIDER_SITE_OTHER): Payer: PPO | Admitting: Family Medicine

## 2019-10-27 VITALS — BP 131/75 | HR 74 | Resp 16 | Ht 69.0 in | Wt 221.0 lb

## 2019-10-27 DIAGNOSIS — E039 Hypothyroidism, unspecified: Secondary | ICD-10-CM | POA: Diagnosis not present

## 2019-10-27 DIAGNOSIS — Z8619 Personal history of other infectious and parasitic diseases: Secondary | ICD-10-CM | POA: Diagnosis not present

## 2019-10-27 DIAGNOSIS — W57XXXA Bitten or stung by nonvenomous insect and other nonvenomous arthropods, initial encounter: Secondary | ICD-10-CM | POA: Diagnosis not present

## 2019-10-27 DIAGNOSIS — I10 Essential (primary) hypertension: Secondary | ICD-10-CM | POA: Insufficient documentation

## 2019-10-27 DIAGNOSIS — E1140 Type 2 diabetes mellitus with diabetic neuropathy, unspecified: Secondary | ICD-10-CM | POA: Diagnosis not present

## 2019-10-27 HISTORY — DX: Essential (primary) hypertension: I10

## 2019-10-27 MED ORDER — DOXYCYCLINE HYCLATE 100 MG PO TABS: 100.0000 mg | ORAL_TABLET | Freq: Two times a day (BID) | ORAL | 0 refills | Status: AC

## 2019-10-27 MED ORDER — JANUVIA 100 MG PO TABS: 100.0000 mg | ORAL_TABLET | Freq: Every day | ORAL | 0 refills | Status: DC

## 2019-10-27 MED ORDER — LEVOTHYROXINE SODIUM 150 MCG PO TABS: 150.0000 ug | ORAL_TABLET | Freq: Every morning | ORAL | 0 refills | Status: DC

## 2019-10-27 NOTE — Assessment & Plan Note (Addendum)
Current spot since he was outside will provide with antibiotic treatment for bug bite infection starting, if not proving or starting to improve in the next 3 to 4 days or if he worsens he is advised to call us back and we will treat for shingles if needed. Patient acknowledged agreement and understanding of the plan.   Reviewed side effects, risks and benefits of medication.

## 2019-10-27 NOTE — Assessment & Plan Note (Addendum)
History of shingles.  Dr. Juanetta Gosling treated in the past.  Strange atypical presentation with bumps in several areas of the body not located along a dermatome pattern line. Currently will be getting titer to see if he can have shingles vaccine.

## 2019-10-27 NOTE — Assessment & Plan Note (Signed)
Continue all current medications as ordered. DASH diet and exercise encouraged. Updated labs ordered.

## 2019-10-27 NOTE — Telephone Encounter (Signed)
Please provide the 90 day refills on Rx.  The foot issue does not seem to be related to the "spider bite" I would see if ice or heat help. Wear good shoes, don't walk bare foot. If no improvement, we will revisit at next visit or he can call if it gets worse.

## 2019-10-27 NOTE — Assessment & Plan Note (Addendum)
Justin Klein is encouraged to check blood sugar daily as directed. Continue current medications. Is not on statin as well. Educated on importance of maintain a well balanced diabetic friendly diet. He is reminded the importance of maintaining  good blood sugars,  taking medications as directed, daily foot care, annual eye exams. Additionally educated about keeping good control over blood pressure and cholesterol as well.

## 2019-10-27 NOTE — Assessment & Plan Note (Signed)
Is on Synthroid, TSH level ordered.  Denies have any signs symptoms of hypothyroidism at this time.  Adjustments will be made as necessary.

## 2019-10-27 NOTE — Patient Instructions (Signed)
I appreciate the opportunity to provide you with care for your health and wellness. Today we discussed: recent infection/possible shingles  Follow up: as scheduled   Labs today No referrals today  I hope you feel better! Keep [place] posted!   Please continue to practice social distancing to keep you, your family, and our community safe.  If you must go out, please wear a mask and practice good handwashing.  It was a pleasure to see you and I look forward to continuing to work together on your health and well-being. Please do not hesitate to call the office if you need care or have questions about your care.  Have a wonderful day and week. With Gratitude, Tereasa Coop, DNP, AGNP-BC

## 2019-10-27 NOTE — Progress Notes (Signed)
Subjective:  Patient ID: Justin Klein, male    DOB: 09-20-1960  Age: 59 y.o. MRN: [id number]  CC:  Chief Complaint  Patient presents with  . Diabetes    follow up  . Herpes Zoster    2 small bumps right together on back. look like they are drying up. pt says they itch and burn.no bumps anywhere else      HPI  HPI  Justin Klein is a 59 year old male patient who has a history of type 2 diabetes, hypothyroidism, history of PE, asthma among others.  Presents today for follow-up on diabetes.  He additionally reports that he has questionable bug bite versus shingles on his back.  Diabetes follow-up:  Blood sugars at home are doing well..  Denies hypoglycemia.  Denies polydipsia and polyuria.  Last A1c March of this year, 6.1%. Denies non-healing wounds or rashes. Denies signs of UTI or other infections. Patient follows a low sugar diet and checks feet regularly without concerns. Reports staying hydrated by drinking water.   Bite: Reports Monday he was out and mowing.  He developed a bump on his back.  Right side.  Had a few pustules in it he had old doxycycline from Dr. Juanetta Gosling so he decided to take it to see if it was going to help but it was a bug bite.  He denies having any other areas at this time.  He is questioning whether or not it was a spider bite as he went through several fireworks when he was mowing.  He reports itchiness and tenderness to the area.  Of note he has had shingles in the past it presented very similarly.  Where he had several bumps on different areas of his body and not also across the nerve area.  Dr. Juanetta Gosling diagnosed him with shingles and provided treatment at that time.  He did not get the shingles vaccine at that time.  He is unsure if he has had chickenpox and would like to get a titer to see if he can get the shingles vaccine.  He denies seeing any tick or insect on his skin.  Did not have to remove any insect from his skin.  Denies having any fevers, chills,  nausea, vomiting or fatigue at this time.  Denies having any headaches vision changes or chest pain.  He has had his Covid vaccines.  Declines getting a flu shot today in the office.  Today patient denies signs and symptoms of COVID 19 infection including fever, chills, cough, shortness of breath, and headache. Past Medical, Surgical, Social History, Allergies, and Medications have been Reviewed.   Past Medical History:  Diagnosis Date  . Asthma   . Bilateral leg weakness 07/21/2012  . Diabetes mellitus, type 2 (HCC)   . Hypothyroidism   . Ingrown nail of great toe of left foot 05/11/2019  . Numbness and tingling of both legs 07/21/2012  . Pernicious anemia   . PSVT (paroxysmal supraventricular tachycardia) (HCC) 08/26/2011  . Pulmonary embolus (HCC)    Diagnosed 9/12 at [facility]  . Pulmonary embolus (HCC) 08/26/2011  . Seborrheic dermatitis   . SVT (supraventricular tachycardia) (HCC)   . Wart on thumb 08/20/2019    Current Meds  Medication Sig  . albuterol (VENTOLIN HFA) 108 (90 Base) MCG/ACT inhaler Inhale 1-2 puffs into the lungs every 6 (six) hours as needed for wheezing or shortness of breath.  . diltiazem (CARDIZEM) 30 MG tablet Take by mouth as needed.  . fluticasone (  CUTIVATE) 0.05 % cream APPLY TO AFFECTED AREASCON FACE UP TO TWICE DAILYTAS NEEDED FOR RASH.  . fluticasone furoate-vilanterol (BREO ELLIPTA) 100-25 MCG/INH AEPB Inhale 1 puff into the lungs daily.  . Lancets (ONETOUCH DELICA PLUS LANCET33G) MISC USE AS DIRECTED THREE TODFOUR TIMES DAILY.  Marland Kitchen lisinopril (ZESTRIL) 5 MG tablet Take 5 mg by mouth daily.  Letta Pate ULTRA test strip USE TO TEST BLOOD SUGARFTWICE DAILY.  . sildenafil (VIAGRA) 100 MG tablet Take 1 tablet (100 mg total) by mouth as needed for erectile dysfunction.  . [DISCONTINUED] JANUVIA 100 MG tablet Take 100 mg by mouth daily.  . [DISCONTINUED] levothyroxine (SYNTHROID) 150 MCG tablet Take 150 mcg by mouth every morning.    ROS:  Review of Systems    Constitutional: Negative.   HENT: Negative.   Eyes: Negative.   Respiratory: Negative.   Cardiovascular: Negative.   Gastrointestinal: Negative.   Genitourinary: Negative.   Musculoskeletal: Negative.   Skin: Negative.        Questionable bug bite see HPI  Neurological: Negative.   Endo/Heme/Allergies: Negative.   Psychiatric/Behavioral: Negative.      Objective:   Today's Vitals: BP 131/75   Pulse 74   Resp 16   Ht 5\' 9"  (1.753 m)   Wt 221 lb 0.6 oz (100.3 kg)   SpO2 96%   BMI 32.64 kg/m  Vitals with BMI 10/27/2019 08/20/2019 05/07/2019  Height 5\' 9"  5\' 9"  5\' 9"   Weight 221 lbs 1 oz 214 lbs 6 oz 209 lbs  BMI 32.63 31.65 30.85  Systolic 131 128 07/07/2019  Diastolic 75 77 78  Pulse 74 63 82     Physical Exam Vitals and nursing note reviewed.  Constitutional:      Appearance: Normal appearance. He is well-developed and well-groomed. He is obese.  HENT:     Head: Normocephalic and atraumatic.     Right Ear: External ear normal.     Left Ear: External ear normal.     Mouth/Throat:     Comments: Mask in place  Eyes:     General:        Right eye: No discharge.        Left eye: No discharge.     Conjunctiva/sclera: Conjunctivae normal.  Cardiovascular:     Rate and Rhythm: Normal rate and regular rhythm.     Pulses: Normal pulses.     Heart sounds: Normal heart sounds.  Pulmonary:     Effort: Pulmonary effort is normal.     Breath sounds: Normal breath sounds.  Musculoskeletal:        General: Normal range of motion.     Cervical back: Normal range of motion and neck supple.     Comments: Cane   Skin:    General: Skin is warm.          Comments: Noted location in picture, right side back, noted quarter size diameter of erythemic redness slightly raised, 2 questionable puncture/pustular areas in the central portion of the circle.    Neurological:     General: No focal deficit present.     Mental Status: He is alert and oriented to person, place, and time.   Psychiatric:        Attention and Perception: Attention normal.        Mood and Affect: Mood normal.        Speech: Speech normal.        Behavior: Behavior normal. Behavior is cooperative.  Thought Content: Thought content normal.        Cognition and Memory: Cognition normal.        Judgment: Judgment normal.      Assessment   1. Type 2 diabetes mellitus with diabetic neuropathy, without long-term current use of insulin (HCC)   2. Essential hypertension   3. Hypothyroidism, unspecified type   4. History of shingles   5. Bug bite, initial encounter     Tests ordered Orders Placed This Encounter  Procedures  . CBC  . Comprehensive metabolic panel  . Hemoglobin A1c  . TSH  . Varicella Zoster Abs, IgG/IgM     Plan: Please see assessment and plan per problem list above.   Meds ordered this encounter  Medications  . doxycycline (VIBRA-TABS) 100 MG tablet    Sig: Take 1 tablet (100 mg total) by mouth 2 (two) times daily for 10 days.    Dispense:  20 tablet    Refill:  0    Order Specific Question:   Supervising Provider    Answer:   Genia Harold    Patient to follow-up in 02/22/2020  Freddy Finner, NP

## 2019-10-29 LAB — COMPREHENSIVE METABOLIC PANEL
ALT: 19 IU/L (ref 0–44)
AST: 23 IU/L (ref 0–40)
Albumin/Globulin Ratio: 1.5 (ref 1.2–2.2)
Albumin: 4.4 g/dL (ref 3.8–4.9)
Alkaline Phosphatase: 94 IU/L (ref 48–121)
BUN/Creatinine Ratio: 16 (ref 9–20)
BUN: 17 mg/dL (ref 6–24)
Bilirubin Total: 0.2 mg/dL (ref 0.0–1.2)
CO2: 22 mmol/L (ref 20–29)
Calcium: 10.1 mg/dL (ref 8.7–10.2)
Chloride: 105 mmol/L (ref 96–106)
Creatinine, Ser: 1.04 mg/dL (ref 0.76–1.27)
GFR calc Af Amer: 90 mL/min/{1.73_m2} (ref >59)
GFR calc non Af Amer: 78 mL/min/{1.73_m2} (ref >59)
Globulin, Total: 2.9 g/dL (ref 1.5–4.5)
Glucose: 127 mg/dL — ABNORMAL HIGH (ref 65–99)
Potassium: 4.5 mmol/L (ref 3.5–5.2)
Sodium: 140 mmol/L (ref 134–144)
Total Protein: 7.3 g/dL (ref 6.0–8.5)

## 2019-10-29 LAB — TSH: TSH: 3.71 u[IU]/mL (ref 0.450–4.500)

## 2019-10-29 LAB — CBC
Hematocrit: 43.2 % (ref 37.5–51.0)
Hemoglobin: 13.5 g/dL (ref 13.0–17.7)
MCH: 27.3 pg (ref 26.6–33.0)
MCHC: 31.3 g/dL — ABNORMAL LOW (ref 31.5–35.7)
MCV: 87 fL (ref 79–97)
Platelets: 273 10*3/uL (ref 150–450)
RBC: 4.94 x10E6/uL (ref 4.14–5.80)
RDW: 14.3 % (ref 11.6–15.4)
WBC: 5.6 10*3/uL (ref 3.4–10.8)

## 2019-10-29 LAB — VARICELLA ZOSTER ABS, IGG/IGM
Varicella IgM: <0.91 index (ref 0.00–0.90)
Varicella zoster IgG: 2213 index (ref >165)

## 2019-10-29 LAB — HEMOGLOBIN A1C
Est. average glucose Bld gHb Est-mCnc: 137 mg/dL
Hgb A1c MFr Bld: 6.4 % — ABNORMAL HIGH (ref 4.8–5.6)

## 2019-11-02 ENCOUNTER — Ambulatory Visit: Payer: PPO | Admitting: Podiatry

## 2019-11-18 ENCOUNTER — Other Ambulatory Visit: Payer: Self-pay | Admitting: Family Medicine

## 2019-12-23 ENCOUNTER — Other Ambulatory Visit: Payer: Self-pay | Admitting: Family Medicine

## 2019-12-23 DIAGNOSIS — W57XXXA Bitten or stung by nonvenomous insect and other nonvenomous arthropods, initial encounter: Secondary | ICD-10-CM

## 2019-12-24 ENCOUNTER — Other Ambulatory Visit: Payer: Self-pay | Admitting: Family Medicine

## 2019-12-24 DIAGNOSIS — J4520 Mild intermittent asthma, uncomplicated: Secondary | ICD-10-CM

## 2020-01-19 ENCOUNTER — Ambulatory Visit: Payer: PPO | Admitting: Nurse Practitioner

## 2020-01-24 ENCOUNTER — Other Ambulatory Visit: Payer: Self-pay

## 2020-01-24 ENCOUNTER — Encounter: Payer: Self-pay | Admitting: Nurse Practitioner

## 2020-01-24 ENCOUNTER — Ambulatory Visit (INDEPENDENT_AMBULATORY_CARE_PROVIDER_SITE_OTHER): Payer: PPO | Admitting: Nurse Practitioner

## 2020-01-24 VITALS — BP 127/75 | HR 69 | Temp 99.4°F | Resp 18 | Ht 69.0 in | Wt 214.0 lb

## 2020-01-24 DIAGNOSIS — S39012A Strain of muscle, fascia and tendon of lower back, initial encounter: Secondary | ICD-10-CM

## 2020-01-24 HISTORY — DX: Strain of muscle, fascia and tendon of lower back, initial encounter: S39.012A

## 2020-01-24 MED ORDER — IBUPROFEN 600 MG PO TABS: 600.0000 mg | ORAL_TABLET | Freq: Three times a day (TID) | ORAL | 0 refills | Status: DC | PRN

## 2020-01-24 MED ORDER — TIZANIDINE HCL 4 MG PO TABS: 4.0000 mg | ORAL_TABLET | Freq: Four times a day (QID) | ORAL | 0 refills | Status: DC | PRN

## 2020-01-24 NOTE — Assessment & Plan Note (Signed)
-  Rx. Tizanidine -Rx ibuprofen -if no improvement in a week can consider x-rays and /or ortho consult -may consider U/A if any urinary symptoms develop; not likely as he has no urinary issues today

## 2020-01-24 NOTE — Progress Notes (Signed)
Acute Office Visit  Subjective:    Patient ID: Justin Klein, male    DOB: 09/09/1960, 59 y.o.   MRN: [id number]  Chief Complaint  Patient presents with  . Back Pain    muscle strain?     HPI Patient is in today for back pain.  He rates his pain at 2-3/10 and it started with a coughing spell about 2 weeks ago.  He hasn't tried any medication.  He drinks about 2 beers per day, but stopped drinking 2 weeks ago because he was concerned that the pain originated from his kidneys.  His pain gets worse when he bends over.  Past Medical History:  Diagnosis Date  . Asthma   . Asthma 08/26/2011  . Bilateral leg weakness 07/21/2012  . Diabetes mellitus, type 2 (HCC)   . Essential hypertension 10/27/2019  . Hypothyroidism   . Ingrown nail of great toe of left foot 05/11/2019  . Numbness and tingling of both legs 07/21/2012  . Pernicious anemia   . PSVT (paroxysmal supraventricular tachycardia) (HCC) 08/26/2011  . Pulmonary embolus (HCC)    Diagnosed 9/12 at [facility]  . Pulmonary embolus (HCC) 08/26/2011  . Seborrheic dermatitis   . SVT (supraventricular tachycardia) (HCC)   . Wart on thumb 08/20/2019    Past Surgical History:  Procedure Laterality Date  . Epidermal cyst resection    . Left patella tendon repair      Family History  Problem Relation Age of Onset  . Diabetes type II Mother   . Cancer Father   . Diabetes type II Sister     Social History   Socioeconomic History  . Marital status: Divorced    Spouse name: Not on file  . Number of children: 3  . Years of education: Not on file  . Highest education level: 12th grade  Occupational History  . Not on file  Tobacco Use  . Smoking status: Former Smoker    Types: Cigarettes  . Smokeless tobacco: Never Used  . Tobacco comment: 2012 quit  Substance and Sexual Activity  . Alcohol use: No  . Drug use: No  . Sexual activity: Yes  Other Topics Concern  . Not on file  Social History Narrative   Lives with fiance'  [name]    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       Enjoys: music-jazz, Trecia Rogers      Diet: no pork, salads daily, [place], uses sugar free sweeters, dark chocolate 70% or higher   Caffeine: decaff-sodas, ginger ale, tea-with truiva    Water: 3-4 cups       Wears seat belt   Smoke detectors at home   Does not use phone while driving    Social Determinants of Health   Financial Resource Strain: Medium Risk  . Difficulty of Paying Living Expenses: Somewhat hard  Food Insecurity: No Food Insecurity  . Worried About Programme researcher, broadcasting/film/video in the Last Year: Never true  . Ran Out of Food in the Last Year: Never true  Transportation Needs: No Transportation Needs  . Lack of Transportation (Medical): No  . Lack of Transportation (Non-Medical): No  Physical Activity:   . Days of Exercise per Week: Not on file  . Minutes of Exercise per Session: Not on file  Stress: No Stress Concern Present  . Feeling of Stress : Only a little  Social Connections: Moderately Isolated  . Frequency of Communication with Friends and Family: More than three times a week  .  Frequency of Social Gatherings with Friends and Family: More than three times a week  . Attends Religious Services: Never  . Active Member of Clubs or Organizations: No  . Attends Banker Meetings: Never  . Marital Status: Living with partner  Intimate Partner Violence: Not At Risk  . Fear of Current or Ex-Partner: No  . Emotionally Abused: No  . Physically Abused: No  . Sexually Abused: No    Outpatient Medications Prior to Visit  Medication Sig Dispense Refill  . albuterol (VENTOLIN HFA) 108 (90 Base) MCG/ACT inhaler Inhale 1-2 puffs into the lungs every 6 (six) hours as needed for wheezing or shortness of breath. 8 g 0  . diltiazem (CARDIZEM) 30 MG tablet Take by mouth as needed.    . fluticasone (CUTIVATE) 0.05 % cream APPLY TO AFFECTED AREASCON FACE UP TO TWICE DAILYTAS NEEDED FOR RASH.    . fluticasone furoate-vilanterol  (BREO ELLIPTA) 100-25 MCG/INH AEPB Inhale 1 puff into the lungs daily. 28 each 3  . gentamicin cream (GARAMYCIN) 0.1 % Apply 1 application topically 2 (two) times daily. 15 g 1  . JANUVIA 100 MG tablet Take 1 tablet (100 mg total) by mouth daily. 90 tablet 0  . Lancets (ONETOUCH DELICA PLUS LANCET33G) MISC USE AS DIRECTED THREE TODFOUR TIMES DAILY.    Marland Kitchen levothyroxine (SYNTHROID) 150 MCG tablet Take 1 tablet (150 mcg total) by mouth every morning. 90 tablet 0  . lisinopril (ZESTRIL) 5 MG tablet Take 5 mg by mouth daily.    Letta Pate ULTRA test strip USE TO TEST BLOOD SUGARFTWICE DAILY.    . Salicylic Acid 17.6 % LIQD Apply 1 application topically daily. 9.3 mL 0  . sildenafil (VIAGRA) 100 MG tablet Take 1 tablet (100 mg total) by mouth as needed for erectile dysfunction. 20 tablet 1   No facility-administered medications prior to visit.    Allergies  Allergen Reactions  . Shellfish Allergy Nausea And Vomiting    Seafood-upset stomach    Review of Systems  Constitutional: Negative.   Genitourinary: Negative.   Musculoskeletal: Positive for myalgias.       Low back pain per HPI       Objective:    Physical Exam Constitutional:      Appearance: Normal appearance.  Musculoskeletal:        General: No swelling, tenderness or signs of injury.     Comments: Reproducible pain with ROM exercises, worse with lateral flexion of his back  Neurological:     Mental Status: He is alert.     BP 127/75   Pulse 69   Temp 99.4 F (37.4 C)   Resp 18   Ht 5\' 9"  (1.753 m)   Wt 214 lb (97.1 kg)   SpO2 98%   BMI 31.60 kg/m  Wt Readings from Last 3 Encounters:  01/24/20 214 lb (97.1 kg)  10/27/19 221 lb 0.6 oz (100.3 kg)  08/20/19 214 lb 6.4 oz (97.3 kg)    Health Maintenance Due  Topic Date Due  . Hepatitis C Screening  Never done  . PNEUMOCOCCAL POLYSACCHARIDE VACCINE AGE 40-64 HIGH RISK  Never done  . FOOT EXAM  Never done  . OPHTHALMOLOGY EXAM  Never done  . HIV Screening   Never done  . TETANUS/TDAP  Never done  . COLONOSCOPY  Never done  . INFLUENZA VACCINE  Never done    There are no preventive care reminders to display for this patient.   Lab Results  Component  Value Date   TSH 3.710 10/27/2019   Lab Results  Component Value Date   WBC 5.6 10/27/2019   HGB 13.5 10/27/2019   HCT 43.2 10/27/2019   MCV 87 10/27/2019   PLT 273 10/27/2019   Lab Results  Component Value Date   NA 140 10/27/2019   K 4.5 10/27/2019   CO2 22 10/27/2019   GLUCOSE 127 (H) 10/27/2019   BUN 17 10/27/2019   CREATININE 1.04 10/27/2019   BILITOT 0.2 10/27/2019   ALKPHOS 94 10/27/2019   AST 23 10/27/2019   ALT 19 10/27/2019   PROT 7.3 10/27/2019   ALBUMIN 4.4 10/27/2019   CALCIUM 10.1 10/27/2019   Lab Results  Component Value Date   CHOL 202 (H) 05/07/2019   Lab Results  Component Value Date   HDL 47 05/07/2019   Lab Results  Component Value Date   LDLCALC 142 (H) 05/07/2019   Lab Results  Component Value Date   TRIG 69 05/07/2019   Lab Results  Component Value Date   CHOLHDL 4.3 05/07/2019   Lab Results  Component Value Date   HGBA1C 6.4 (H) 10/27/2019       Assessment & Plan:   Problem List Items Addressed This Visit      Musculoskeletal and Integument   Strain of muscle, fascia and tendon of lower back, initial encounter - Primary    -Rx. Tizanidine -Rx ibuprofen -if no improvement in a week can consider x-rays and /or ortho consult -may consider U/A if any urinary symptoms develop; not likely as he has no urinary issues today          No orders of the defined types were placed in this encounter.    Heather Roberts, NP

## 2020-01-26 ENCOUNTER — Ambulatory Visit: Payer: PPO | Admitting: Family Medicine

## 2020-02-08 ENCOUNTER — Other Ambulatory Visit: Payer: Self-pay | Admitting: Family Medicine

## 2020-02-08 DIAGNOSIS — N529 Male erectile dysfunction, unspecified: Secondary | ICD-10-CM

## 2020-02-22 ENCOUNTER — Other Ambulatory Visit: Payer: Self-pay

## 2020-02-22 ENCOUNTER — Encounter: Payer: Self-pay | Admitting: Family Medicine

## 2020-02-22 ENCOUNTER — Ambulatory Visit (INDEPENDENT_AMBULATORY_CARE_PROVIDER_SITE_OTHER): Payer: PPO | Admitting: Family Medicine

## 2020-02-22 VITALS — BP 130/72 | HR 76 | Temp 97.3°F | Ht 69.0 in | Wt 210.0 lb

## 2020-02-22 DIAGNOSIS — I10 Essential (primary) hypertension: Secondary | ICD-10-CM

## 2020-02-22 DIAGNOSIS — Z1159 Encounter for screening for other viral diseases: Secondary | ICD-10-CM

## 2020-02-22 DIAGNOSIS — Z125 Encounter for screening for malignant neoplasm of prostate: Secondary | ICD-10-CM

## 2020-02-22 DIAGNOSIS — E669 Obesity, unspecified: Secondary | ICD-10-CM | POA: Diagnosis not present

## 2020-02-22 DIAGNOSIS — Z0001 Encounter for general adult medical examination with abnormal findings: Secondary | ICD-10-CM

## 2020-02-22 DIAGNOSIS — E7841 Elevated Lipoprotein(a): Secondary | ICD-10-CM | POA: Diagnosis not present

## 2020-02-22 DIAGNOSIS — E039 Hypothyroidism, unspecified: Secondary | ICD-10-CM | POA: Diagnosis not present

## 2020-02-22 DIAGNOSIS — E1140 Type 2 diabetes mellitus with diabetic neuropathy, unspecified: Secondary | ICD-10-CM | POA: Diagnosis not present

## 2020-02-22 NOTE — Progress Notes (Signed)
Health Maintenance reviewed -  Immunization History  Administered Date(s) Administered  . Janssen (J&J) SARS-COV-2 Vaccination 06/01/2019  . Moderna Sars-Covid-2 Vaccination 01/07/2020   Last colonoscopy: Due at 2023 Last PSA: Ordered today Dentist: 2016-2017; reports needing work done Ophtho: 2 years past due- getting apptin the new year  Exercise: not structured-  Smoker: no tobacco Alcohol Use: 6 pk over the week  Other doctors caring for patient include:  Patient Care Team: Freddy Finner, NP as PCP - General (Family Medicine)  End of Life Discussion:  Patient does not have a living will and medical power of attorney  Subjective:   HPI  Justin Klein is a 59 y.o. male who presents for annual visit and follow-up on chronic medical conditions.  He has the following concerns: Overall doing well. Needs to get eye dr appt. Declines PNA vaccines.   Review Of Systems  Review of Systems  Respiratory: Negative.   Cardiovascular: Negative.   Gastrointestinal: Negative.   Genitourinary: Negative.   All other systems reviewed and are negative.   Objective:   PHYSICAL EXAM:  BP 130/72 (BP Location: Right Arm, Patient Position: Sitting, Cuff Size: Normal)   Pulse 76   Temp (!) 97.3 F (36.3 C) (Temporal)   Ht 5\' 9"  (1.753 m)   Wt 210 lb (95.3 kg)   SpO2 97%   BMI 31.01 kg/m   Physical Exam Vitals and nursing note reviewed.  Constitutional:      Appearance: Normal appearance. He is well-developed and well-groomed. He is obese.  HENT:     Head: Normocephalic and atraumatic.     Right Ear: Hearing, tympanic membrane, ear canal and external ear normal.     Left Ear: Hearing, tympanic membrane, ear canal and external ear normal.     Nose: Nose normal.     Mouth/Throat:     Comments: Mask in place  Eyes:     General: Lids are normal.        Right eye: No discharge.        Left eye: No discharge.     Extraocular Movements: Extraocular movements intact.      Conjunctiva/sclera: Conjunctivae normal.     Pupils: Pupils are equal, round, and reactive to light.  Neck:     Thyroid: No thyroid mass, thyromegaly or thyroid tenderness.     Vascular: No carotid bruit.  Cardiovascular:     Rate and Rhythm: Normal rate and regular rhythm.     Pulses: Normal pulses.     Heart sounds: Normal heart sounds.  Pulmonary:     Effort: Pulmonary effort is normal.     Breath sounds: Normal breath sounds.  Abdominal:     General: Bowel sounds are normal. There is no distension.     Palpations: Abdomen is soft.     Tenderness: There is no right CVA tenderness or left CVA tenderness.     Hernia: No hernia is present.  Musculoskeletal:        General: Normal range of motion.     Cervical back: Normal range of motion and neck supple.     Right lower leg: No edema.     Left lower leg: No edema.     Right foot: Normal range of motion.     Left foot: Normal range of motion.     Comments: MAE, ROM intact   Feet:     Right foot:     Skin integrity: Skin integrity normal.  Left foot:     Skin integrity: Skin integrity normal.  Lymphadenopathy:     Cervical: No cervical adenopathy.  Skin:    General: Skin is warm and dry.     Capillary Refill: Capillary refill takes less than 2 seconds.  Neurological:     General: No focal deficit present.     Mental Status: He is alert and oriented to person, place, and time.     Cranial Nerves: Cranial nerves are intact.     Sensory: Sensation is intact.     Motor: Motor function is intact.     Coordination: Coordination is intact.     Gait: Gait is intact.     Deep Tendon Reflexes: Reflexes are normal and symmetric.  Psychiatric:        Attention and Perception: Attention normal.        Mood and Affect: Mood and affect normal.        Speech: Speech normal.        Behavior: Behavior normal. Behavior is cooperative.        Thought Content: Thought content normal.        Cognition and Memory: Cognition and memory  normal.        Judgment: Judgment normal.      Depression Screening  Depression screen [facility] 2/9 02/22/2020 02/22/2020 01/24/2020 10/27/2019 08/20/2019  Decreased Interest 0 0 0 0 0  Down, Depressed, Hopeless 0 0 0 0 0  PHQ - 2 Score 0 0 0 0 0     Falls  Fall Risk  02/22/2020 01/24/2020 10/27/2019 08/20/2019 05/07/2019  Falls in the past year? 1 0 1 1 1   Number falls in past yr: 1 0 1 0 1  Injury with Fall? 0 0 0 1 0  Risk for fall due to : Impaired balance/gait No Fall Risks - History of fall(s);Impaired balance/gait;Impaired mobility -  Follow up Falls evaluation completed Falls evaluation completed - Falls evaluation completed;Education provided;Falls prevention discussed -    Assessment & Plan:   1. Annual visit for general adult medical examination with abnormal findings   2. Type 2 diabetes mellitus with diabetic neuropathy, without long-term current use of insulin (HCC)   3. Hypothyroidism, unspecified type   4. Essential hypertension   5. Obesity (BMI 30.0-34.9)   6. Encounter for screening for malignant neoplasm of prostate   7. Elevated lipoprotein(a)   8. Encounter for hepatitis C screening test for low risk patient     Tests ordered Orders Placed This Encounter  Procedures  . CBC  . Comprehensive metabolic panel  . Hemoglobin A1c  . Lipid panel  . TSH  . PSA  . Microalbumin / creatinine urine ratio  . HCV RNA quant     Plan: Please see assessment and plan per problem list above.   No orders of the defined types were placed in this encounter.  I have personally reviewed: The patient's medical and social history Their use of alcohol, tobacco or illicit drugs Their current medications and supplements The patient's functional ability including ADLs,fall risks, home safety risks, cognitive, and hearing and visual impairment Diet and physical activities Evidence for depression or mood disorders  The patient's weight, height, BMI, and visual acuity have  been recorded in the chart.  I have made referrals, counseling, and provided education to the patient based on review of the above and I have provided the patient with a written personalized care plan for preventive services.     06-29-1992  Arvilla Market, NP   02/23/2020

## 2020-02-22 NOTE — Patient Instructions (Signed)
I appreciate the opportunity to provide you with care for your health and wellness. Today we discussed: overall health   Follow up: 6 months (if ok to see another provider, if not can do 7 months)   Copy of ACP paperwork  Labs-fasting today  No referrals today  Call if wart does not go away and we will refer to Dermatology  Please get eye dr appt soon  Look into dentist appt as well  HAPPY NEW YEAR!  Please continue to practice social distancing to keep you, your family, and our community safe.  If you must go out, please wear a mask and practice good handwashing.  It was a pleasure to see you and I look forward to continuing to work together on your health and well-being. Please do not hesitate to call the office if you need care or have questions about your care.  Have a wonderful day. With Gratitude, Tereasa Coop, DNP, AGNP-BC  HEALTH MAINTENANCE RECOMMENDATIONS:  It is recommended that you get at least 30 minutes of aerobic exercise at least 5 days/week (for weight loss, you may need as much as 60-90 minutes). This can be any activity that gets your heart rate up. This can be divided in 10-15 minute intervals if needed, but try and build up your endurance at least once a week.  Weight bearing exercise is also recommended twice weekly.  Eat a healthy diet with lots of vegetables, fruits and fiber.  "Colorful" foods have a lot of vitamins (ie green vegetables, tomatoes, red peppers, etc).  Limit sweet tea, regular sodas and alcoholic beverages, all of which has a lot of calories and sugar.  Up to 2 alcoholic drinks daily may be beneficial for men (unless trying to lose weight, watch sugars).  Drink a lot of water.  Sunscreen of at least SPF 30 should be used on all sun-exposed parts of the skin when outside between the hours of 10 am and 4 pm (not just when at beach or pool, but even with exercise, golf, tennis, and yard work!)  Use a sunscreen that says "broad spectrum" so it  covers both UVA and UVB rays, and make sure to reapply every 1-2 hours.  Remember to change the batteries in your smoke detectors when changing your clock times in the spring and fall.  Use your seat belt every time you are in a car, and please drive safely and not be distracted with cell phones and texting while driving.

## 2020-02-23 ENCOUNTER — Encounter: Payer: Self-pay | Admitting: Family Medicine

## 2020-02-23 DIAGNOSIS — E785 Hyperlipidemia, unspecified: Secondary | ICD-10-CM | POA: Insufficient documentation

## 2020-02-23 DIAGNOSIS — Z0001 Encounter for general adult medical examination with abnormal findings: Secondary | ICD-10-CM | POA: Insufficient documentation

## 2020-02-23 NOTE — Assessment & Plan Note (Signed)
Updated labs ordered Heart healthy DM friend diet encouraged

## 2020-02-23 NOTE — Assessment & Plan Note (Signed)
Justin Klein is encouraged to maintain a well balanced diet that is low in salt. Controlled, continue current medication regimen.  Additionally, he is also reminded that exercise is beneficial for heart health and control of  Blood pressure. 30-60 minutes daily is recommended-walking was suggested.

## 2020-02-23 NOTE — Assessment & Plan Note (Signed)
Is on Synthroid, denies have any signs or symptoms of hypothyroidism at this time.  TSH ordered

## 2020-02-23 NOTE — Assessment & Plan Note (Signed)
Justin Klein is encouraged to check blood sugar daily as directed. Continue current medications. Is on statin as well- reports being willing to start- will await labs to see what is best to order Educated on importance of maintain a well balanced diabetic friendly diet.   He is reminded the importance of maintaining  good blood sugars,  taking medications as directed, daily foot care, annual eye exams. Additionally educated about keeping good control over blood pressure and cholesterol as well.

## 2020-02-23 NOTE — Assessment & Plan Note (Signed)
Discussed PSA screening (risks/benefits), recommended at least 30 minutes of aerobic activity at least 5 days/week; proper sunscreen use reviewed; healthy diet and alcohol recommendations (less than or equal to 2 drinks/day) reviewed; regular seatbelt use; changing batteries in smoke detectors. Immunization recommendations discussed.  Colonoscopy recommendations reviewed. ° °

## 2020-02-23 NOTE — Assessment & Plan Note (Signed)
Improved  Justin Klein is re-educated about the importance of exercise daily to help with weight management. A minumum of 30 minutes daily is recommended. Additionally, importance of healthy food choices  with portion control discussed.  Wt Readings from Last 3 Encounters:  02/22/20 210 lb (95.3 kg)  01/24/20 214 lb (97.1 kg)  10/27/19 221 lb 0.6 oz (100.3 kg)

## 2020-02-24 ENCOUNTER — Other Ambulatory Visit: Payer: Self-pay | Admitting: Family Medicine

## 2020-02-24 DIAGNOSIS — E7841 Elevated Lipoprotein(a): Secondary | ICD-10-CM

## 2020-02-24 LAB — MICROALBUMIN / CREATININE URINE RATIO
Creatinine, Urine: 215.2 mg/dL
Microalb/Creat Ratio: 10 mg/g creat (ref 0–29)
Microalbumin, Urine: 22.2 ug/mL

## 2020-02-24 LAB — CBC
Hematocrit: 43.2 % (ref 37.5–51.0)
Hemoglobin: 14.2 g/dL (ref 13.0–17.7)
MCH: 28.2 pg (ref 26.6–33.0)
MCHC: 32.9 g/dL (ref 31.5–35.7)
MCV: 86 fL (ref 79–97)
Platelets: 247 10*3/uL (ref 150–450)
RBC: 5.03 x10E6/uL (ref 4.14–5.80)
RDW: 14.7 % (ref 11.6–15.4)
WBC: 5.1 10*3/uL (ref 3.4–10.8)

## 2020-02-24 LAB — COMPREHENSIVE METABOLIC PANEL
ALT: 20 IU/L (ref 0–44)
AST: 24 IU/L (ref 0–40)
Albumin/Globulin Ratio: 1.4 (ref 1.2–2.2)
Albumin: 4.5 g/dL (ref 3.8–4.9)
Alkaline Phosphatase: 88 IU/L (ref 44–121)
BUN/Creatinine Ratio: 17 (ref 9–20)
BUN: 19 mg/dL (ref 6–24)
Bilirubin Total: 0.4 mg/dL (ref 0.0–1.2)
CO2: 22 mmol/L (ref 20–29)
Calcium: 10.6 mg/dL — ABNORMAL HIGH (ref 8.7–10.2)
Chloride: 103 mmol/L (ref 96–106)
Creatinine, Ser: 1.15 mg/dL (ref 0.76–1.27)
GFR calc Af Amer: 80 mL/min/{1.73_m2} (ref >59)
GFR calc non Af Amer: 69 mL/min/{1.73_m2} (ref >59)
Globulin, Total: 3.2 g/dL (ref 1.5–4.5)
Glucose: 120 mg/dL — ABNORMAL HIGH (ref 65–99)
Potassium: 4.7 mmol/L (ref 3.5–5.2)
Sodium: 138 mmol/L (ref 134–144)
Total Protein: 7.7 g/dL (ref 6.0–8.5)

## 2020-02-24 LAB — HEMOGLOBIN A1C
Est. average glucose Bld gHb Est-mCnc: 131 mg/dL
Hgb A1c MFr Bld: 6.2 % — ABNORMAL HIGH (ref 4.8–5.6)

## 2020-02-24 LAB — LIPID PANEL
Chol/HDL Ratio: 4.5 ratio (ref 0.0–5.0)
Cholesterol, Total: 185 mg/dL (ref 100–199)
HDL: 41 mg/dL (ref >39)
LDL Chol Calc (NIH): 129 mg/dL — ABNORMAL HIGH (ref 0–99)
Triglycerides: 81 mg/dL (ref 0–149)
VLDL Cholesterol Cal: 15 mg/dL (ref 5–40)

## 2020-02-24 LAB — TSH: TSH: 1.53 u[IU]/mL (ref 0.450–4.500)

## 2020-02-24 LAB — PSA: Prostate Specific Ag, Serum: 3 ng/mL (ref 0.0–4.0)

## 2020-02-24 MED ORDER — ROSUVASTATIN CALCIUM 5 MG PO TABS: 5.0000 mg | ORAL_TABLET | Freq: Every day | ORAL | 1 refills | Status: DC

## 2020-03-02 ENCOUNTER — Other Ambulatory Visit: Payer: Self-pay | Admitting: Family Medicine

## 2020-03-03 ENCOUNTER — Other Ambulatory Visit: Payer: Self-pay | Admitting: Family Medicine

## 2020-03-03 ENCOUNTER — Encounter: Payer: Self-pay | Admitting: Family Medicine

## 2020-03-03 DIAGNOSIS — I10 Essential (primary) hypertension: Secondary | ICD-10-CM

## 2020-03-03 DIAGNOSIS — N529 Male erectile dysfunction, unspecified: Secondary | ICD-10-CM

## 2020-03-03 DIAGNOSIS — E7841 Elevated Lipoprotein(a): Secondary | ICD-10-CM

## 2020-03-03 DIAGNOSIS — E1140 Type 2 diabetes mellitus with diabetic neuropathy, unspecified: Secondary | ICD-10-CM

## 2020-03-03 DIAGNOSIS — E039 Hypothyroidism, unspecified: Secondary | ICD-10-CM

## 2020-03-03 MED ORDER — DILTIAZEM HCL 30 MG PO TABS: 30.0000 mg | ORAL_TABLET | Freq: Every day | ORAL | 1 refills | Status: DC

## 2020-03-03 MED ORDER — LEVOTHYROXINE SODIUM 150 MCG PO TABS: 150.0000 ug | ORAL_TABLET | Freq: Every morning | ORAL | 1 refills | Status: DC

## 2020-03-03 MED ORDER — JANUVIA 100 MG PO TABS
100.0000 mg | ORAL_TABLET | Freq: Every day | ORAL | 1 refills | Status: DC
Start: 2020-03-03 — End: 2020-03-16

## 2020-03-03 MED ORDER — SILDENAFIL CITRATE 100 MG PO TABS: 100.0000 mg | ORAL_TABLET | ORAL | 1 refills | Status: DC | PRN

## 2020-03-06 ENCOUNTER — Other Ambulatory Visit: Payer: Self-pay

## 2020-03-06 MED ORDER — FLUTICASONE PROPIONATE 0.05 % EX CREA: TOPICAL_CREAM | CUTANEOUS | 0 refills | Status: DC

## 2020-03-10 ENCOUNTER — Encounter: Payer: Self-pay | Admitting: Family Medicine

## 2020-03-15 ENCOUNTER — Other Ambulatory Visit: Payer: Self-pay

## 2020-03-15 DIAGNOSIS — J4520 Mild intermittent asthma, uncomplicated: Secondary | ICD-10-CM

## 2020-03-15 DIAGNOSIS — I10 Essential (primary) hypertension: Secondary | ICD-10-CM

## 2020-03-15 MED ORDER — DILTIAZEM HCL 30 MG PO TABS: 30.0000 mg | ORAL_TABLET | Freq: Every day | ORAL | 2 refills | Status: DC

## 2020-03-15 MED ORDER — FLUTICASONE FUROATE-VILANTEROL 100-25 MCG/INH IN AEPB: 1.0000 | INHALATION_SPRAY | Freq: Every day | RESPIRATORY_TRACT | 2 refills | Status: DC

## 2020-03-15 MED ORDER — FLUTICASONE PROPIONATE 0.05 % EX CREA: TOPICAL_CREAM | CUTANEOUS | 3 refills | Status: DC

## 2020-03-16 ENCOUNTER — Other Ambulatory Visit: Payer: Self-pay

## 2020-03-16 DIAGNOSIS — E1140 Type 2 diabetes mellitus with diabetic neuropathy, unspecified: Secondary | ICD-10-CM

## 2020-03-16 DIAGNOSIS — E039 Hypothyroidism, unspecified: Secondary | ICD-10-CM

## 2020-03-16 MED ORDER — ONETOUCH ULTRA VI STRP: ORAL_STRIP | 0 refills | Status: DC

## 2020-03-16 MED ORDER — ONETOUCH DELICA PLUS LANCET33G MISC: 0 refills | Status: DC

## 2020-03-16 MED ORDER — JANUVIA 100 MG PO TABS: 100.0000 mg | ORAL_TABLET | Freq: Every day | ORAL | 1 refills | Status: DC

## 2020-03-16 MED ORDER — LEVOTHYROXINE SODIUM 150 MCG PO TABS: 150.0000 ug | ORAL_TABLET | Freq: Every morning | ORAL | 1 refills | Status: DC

## 2020-03-21 ENCOUNTER — Other Ambulatory Visit: Payer: Self-pay

## 2020-03-21 DIAGNOSIS — N529 Male erectile dysfunction, unspecified: Secondary | ICD-10-CM

## 2020-03-21 MED ORDER — SILDENAFIL CITRATE 100 MG PO TABS: 100.0000 mg | ORAL_TABLET | ORAL | 1 refills | Status: DC | PRN

## 2020-03-22 ENCOUNTER — Other Ambulatory Visit: Payer: Self-pay

## 2020-03-22 DIAGNOSIS — E1140 Type 2 diabetes mellitus with diabetic neuropathy, unspecified: Secondary | ICD-10-CM

## 2020-03-22 MED ORDER — UNABLE TO FIND: 0 refills | Status: DC

## 2020-03-27 ENCOUNTER — Other Ambulatory Visit: Payer: Self-pay

## 2020-03-27 ENCOUNTER — Encounter: Payer: Self-pay | Admitting: Family Medicine

## 2020-03-28 ENCOUNTER — Other Ambulatory Visit: Payer: Self-pay

## 2020-03-28 DIAGNOSIS — N529 Male erectile dysfunction, unspecified: Secondary | ICD-10-CM

## 2020-03-28 MED ORDER — SILDENAFIL CITRATE 100 MG PO TABS: 100.0000 mg | ORAL_TABLET | ORAL | 2 refills | Status: DC | PRN

## 2020-03-28 NOTE — Telephone Encounter (Signed)
If you look in the chart- I ordered 20 pills on 03/21/20. I do not know why they did not fill it that way. Please refax this to [facility] and let him know 20 pills was sent in Jan. And has been resent-if they do not  Fill the right dose, I am unsure how else to get that processed with them. Thank you

## 2020-05-22 ENCOUNTER — Other Ambulatory Visit: Payer: Self-pay

## 2020-05-22 ENCOUNTER — Ambulatory Visit (INDEPENDENT_AMBULATORY_CARE_PROVIDER_SITE_OTHER): Payer: Medicare HMO | Admitting: Nurse Practitioner

## 2020-05-22 ENCOUNTER — Encounter: Payer: Self-pay | Admitting: Nurse Practitioner

## 2020-05-22 VITALS — BP 134/80 | HR 63 | Temp 98.5°F | Resp 20 | Ht 69.0 in | Wt 224.0 lb

## 2020-05-22 DIAGNOSIS — W19XXXA Unspecified fall, initial encounter: Secondary | ICD-10-CM | POA: Insufficient documentation

## 2020-05-22 DIAGNOSIS — J4530 Mild persistent asthma, uncomplicated: Secondary | ICD-10-CM

## 2020-05-22 DIAGNOSIS — Z139 Encounter for screening, unspecified: Secondary | ICD-10-CM

## 2020-05-22 DIAGNOSIS — E039 Hypothyroidism, unspecified: Secondary | ICD-10-CM

## 2020-05-22 DIAGNOSIS — E1140 Type 2 diabetes mellitus with diabetic neuropathy, unspecified: Secondary | ICD-10-CM | POA: Diagnosis not present

## 2020-05-22 DIAGNOSIS — I10 Essential (primary) hypertension: Secondary | ICD-10-CM

## 2020-05-22 DIAGNOSIS — Z0001 Encounter for general adult medical examination with abnormal findings: Secondary | ICD-10-CM | POA: Diagnosis not present

## 2020-05-22 NOTE — Assessment & Plan Note (Signed)
-  taking cardizem -No ACEi or ARB currently; would add that if microalbumin elevated or BP increases

## 2020-05-22 NOTE — Assessment & Plan Note (Signed)
-  no pain today -has peripheral neuropathy, and had extensive work-up previously -Foot exam showed 8/10 sensation bilaterally

## 2020-05-22 NOTE — Assessment & Plan Note (Addendum)
-  will check A1c -taking januvia -on statin, no ACEi or ARB currently

## 2020-05-22 NOTE — Assessment & Plan Note (Signed)
-  will check labs -takes levothyroxine 150 mcg daily

## 2020-05-22 NOTE — Progress Notes (Signed)
Acute Office Visit  Subjective:    Patient ID: Justin Klein, male    DOB: 10/22/1960, 60 y.o.   MRN: [id number]  Chief Complaint  Patient presents with  . Annual Exam    HPI Patient is in today for physical exam. He states that he has been falling d/t his neuropathy. He states that if he is in a dark room, he will fall if he is not able to use visual cues.  He states that he has fallen outside, but doesn't usually fall when he is inside.  Past Medical History:  Diagnosis Date  . Asthma   . Asthma 08/26/2011  . Bilateral leg weakness 07/21/2012  . Diabetes mellitus, type 2 ([place])   . Essential hypertension 10/27/2019  . Hypothyroidism   . Ingrown nail of great toe of left foot 05/11/2019  . Numbness and tingling of both legs 07/21/2012  . Pernicious anemia   . PSVT (paroxysmal supraventricular tachycardia) ([place]) 08/26/2011  . Pulmonary embolus ([place])    Diagnosed 9/12 at [facility]  . Pulmonary embolus ([place]) 08/26/2011  . Seborrheic dermatitis   . Strain of muscle, fascia and tendon of lower back, initial encounter 01/24/2020  . SVT (supraventricular tachycardia) ([place])   . Wart on thumb 08/20/2019    Past Surgical History:  Procedure Laterality Date  . Epidermal cyst resection    . Left patella tendon repair      Family History  Problem Relation Age of Onset  . Diabetes type II Mother   . Cancer Father   . Diabetes type II Sister     Social History   Socioeconomic History  . Marital status: Divorced    Spouse name: Not on file  . Number of children: 3  . Years of education: Not on file  . Highest education level: 12th grade  Occupational History  . Not on file  Tobacco Use  . Smoking status: Former Smoker    Types: Cigarettes  . Smokeless tobacco: Never Used  . Tobacco comment: 2012 quit  Substance and Sexual Activity  . Alcohol use: No  . Drug use: No  . Sexual activity: Yes  Other Topics Concern  . Not on file  Social History Narrative   Lives with  fiance' Marcie Bal    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       Enjoys: music-jazz, Alanda Amass      Diet: no pork, salads daily, [place], uses sugar free sweeters, dark chocolate 70% or higher   Caffeine: decaff-sodas, ginger ale, tea-with truiva    Water: 3-4 cups       Wears seat belt   Smoke detectors at home   Does not use phone while driving    Social Determinants of Health   Financial Resource Strain: Low Risk   . Difficulty of Paying Living Expenses: Not hard at all  Food Insecurity: No Food Insecurity  . Worried About Charity fundraiser in the Last Year: Never true  . Ran Out of Food in the Last Year: Never true  Transportation Needs: No Transportation Needs  . Lack of Transportation (Medical): No  . Lack of Transportation (Non-Medical): No  Physical Activity: Inactive  . Days of Exercise per Week: 0 days  . Minutes of Exercise per Session: 0 min  Stress: No Stress Concern Present  . Feeling of Stress : Not at all  Social Connections: Moderately Isolated  . Frequency of Communication with Friends and Family: More than three times a week  . Frequency of  Social Gatherings with Friends and Family: Never  . Attends Religious Services: Never  . Active Member of Clubs or Organizations: No  . Attends Archivist Meetings: Never  . Marital Status: Living with partner  Intimate Partner Violence: Not At Risk  . Fear of Current or Ex-Partner: No  . Emotionally Abused: No  . Physically Abused: No  . Sexually Abused: No    Outpatient Medications Prior to Visit  Medication Sig Dispense Refill  . albuterol (VENTOLIN HFA) 108 (90 Base) MCG/ACT inhaler INHALE1 OR 2 PUFFS INTO THE LUNGS EVERY SIX HOURS AS NEEDED FOR WHEEZING OR SHORTNESS OF BREATH. 8.5 g 0  . diltiazem (CARDIZEM) 30 MG tablet Take 1 tablet (30 mg total) by mouth daily. 90 tablet 2  . fluticasone (CUTIVATE) 0.05 % cream APPLY TO AFFECTED AREASCON FACE UP TO TWICE DAILYTAS NEEDED FOR RASH. 60 g 3  . fluticasone  furoate-vilanterol (BREO ELLIPTA) 100-25 MCG/INH AEPB Inhale 1 puff into the lungs daily. 90 each 2  . gentamicin cream (GARAMYCIN) 0.1 % Apply 1 application topically 2 (two) times daily. 15 g 1  . JANUVIA 100 MG tablet Take 1 tablet (100 mg total) by mouth daily. 90 tablet 1  . levothyroxine (SYNTHROID) 150 MCG tablet Take 1 tablet (150 mcg total) by mouth every morning. 90 tablet 1  . rosuvastatin (CRESTOR) 5 MG tablet Take 1 tablet (5 mg total) by mouth daily. 90 tablet 1  . sildenafil (VIAGRA) 100 MG tablet Take 1 tablet (100 mg total) by mouth as needed for erectile dysfunction. 20 tablet 2  . UNABLE TO FIND True Metrix meter, True Metrix test strips, lancets, control solution, and alcohol swabs. 1 Product 0   No facility-administered medications prior to visit.    Allergies  Allergen Reactions  . Shellfish Allergy Nausea And Vomiting    Seafood-upset stomach    Review of Systems  Constitutional: Negative.   HENT: Negative.   Eyes: Negative.   Respiratory: Negative.   Cardiovascular: Negative.   Gastrointestinal: Negative.   Endocrine: Negative.   Genitourinary: Negative.   Musculoskeletal: Negative.   Skin: Negative.   Allergic/Immunologic: Negative.   Neurological: Positive for numbness.       To bilateral feet, this is chronic and he has seen neurology and had nerve conduction studies for this  Hematological: Negative.   Psychiatric/Behavioral: Negative.        Objective:    Physical Exam Constitutional:      Appearance: He is obese.  HENT:     Head: Normocephalic and atraumatic.     Right Ear: Tympanic membrane, ear canal and external ear normal.     Left Ear: Tympanic membrane, ear canal and external ear normal.     Nose: Nose normal.     Mouth/Throat:     Mouth: Mucous membranes are moist.     Pharynx: Oropharynx is clear.  Eyes:     Extraocular Movements: Extraocular movements intact.     Conjunctiva/sclera: Conjunctivae normal.     Pupils: Pupils are  equal, round, and reactive to light.  Cardiovascular:     Rate and Rhythm: Normal rate and regular rhythm.     Pulses: Normal pulses.          Dorsalis pedis pulses are 2+ on the right side and 2+ on the left side.       Posterior tibial pulses are 2+ on the right side and 2+ on the left side.     Heart sounds: Normal heart  sounds.  Pulmonary:     Effort: Pulmonary effort is normal.     Breath sounds: Normal breath sounds.  Abdominal:     General: Abdomen is flat. Bowel sounds are normal.     Palpations: Abdomen is soft.  Musculoskeletal:        General: Normal range of motion.     Cervical back: Normal range of motion and neck supple.  Feet:     Right foot:     Protective Sensation: 10 sites tested. 8 sites sensed.     Skin integrity: Dry skin present.     Toenail Condition: Right toenails are abnormally thick.     Left foot:     Protective Sensation: 10 sites tested. 8 sites sensed.     Skin integrity: Dry skin present.     Toenail Condition: Left toenails are abnormally thick.  Skin:    General: Skin is warm and dry.     Capillary Refill: Capillary refill takes less than 2 seconds.  Neurological:     Mental Status: He is alert and oriented to person, place, and time.     Cranial Nerves: No cranial nerve deficit.     Sensory: Sensory deficit present.     Motor: No weakness.     Coordination: Coordination normal.     Gait: Gait normal.     Comments: Sensory deficit to feet     BP 134/80   Pulse 63   Temp 98.5 F (36.9 C)   Resp 20   Ht '5\' 9"'  (1.753 m)   Wt 224 lb (101.6 kg)   SpO2 95%   BMI 33.08 kg/m  Wt Readings from Last 3 Encounters:  05/22/20 224 lb (101.6 kg)  02/22/20 210 lb (95.3 kg)  01/24/20 214 lb (97.1 kg)    Health Maintenance Due  Topic Date Due  . Hepatitis C Screening  Never done  . OPHTHALMOLOGY EXAM  Never done    There are no preventive care reminders to display for this patient.   Lab Results  Component Value Date   TSH 1.530  02/22/2020   Lab Results  Component Value Date   WBC 5.1 02/22/2020   HGB 14.2 02/22/2020   HCT 43.2 02/22/2020   MCV 86 02/22/2020   PLT 247 02/22/2020   Lab Results  Component Value Date   NA 138 02/22/2020   K 4.7 02/22/2020   CO2 22 02/22/2020   GLUCOSE 120 (H) 02/22/2020   BUN 19 02/22/2020   CREATININE 1.15 02/22/2020   BILITOT 0.4 02/22/2020   ALKPHOS 88 02/22/2020   AST 24 02/22/2020   ALT 20 02/22/2020   PROT 7.7 02/22/2020   ALBUMIN 4.5 02/22/2020   CALCIUM 10.6 (H) 02/22/2020   Lab Results  Component Value Date   CHOL 185 02/22/2020   Lab Results  Component Value Date   HDL 41 02/22/2020   Lab Results  Component Value Date   LDLCALC 129 (H) 02/22/2020   Lab Results  Component Value Date   TRIG 81 02/22/2020   Lab Results  Component Value Date   CHOLHDL 4.5 02/22/2020   Lab Results  Component Value Date   HGBA1C 6.2 (H) 02/22/2020       Assessment & Plan:   Problem List Items Addressed This Visit      Cardiovascular and Mediastinum   Essential hypertension    -taking cardizem -No ACEi or ARB currently; would add that if microalbumin elevated or BP increases  Relevant Orders   CBC with Differential/Platelet   CMP14+EGFR   Lipid Panel With LDL/HDL Ratio     Respiratory   Asthma    -uses PRN albuterol -has used Breo in the past -well controlled        Endocrine   Type 2 diabetes mellitus with diabetic neuropathy, without long-term current use of insulin (HCC)    -will check A1c -taking januvia -on statin, no ACEi or ARB currently      Relevant Orders   Hemoglobin A1c   Lipid Panel With LDL/HDL Ratio   Microalbumin / creatinine urine ratio   Hypothyroidism    -will check labs -takes levothyroxine 150 mcg daily      Relevant Orders   TSH + free T4     Other   Annual visit for general adult medical examination with abnormal findings    -will check routine fasting labs, A1c, thyroid panel, and PSA      Relevant  Orders   CBC with Differential/Platelet   CMP14+EGFR   Hemoglobin A1c   Lipid Panel With LDL/HDL Ratio   Microalbumin / creatinine urine ratio   TSH + free T4   HCV Ab w/Rflx to Verification   PSA   Falls    -no pain today -has peripheral neuropathy, and had extensive work-up previously -Foot exam showed 8/10 sensation bilaterally       Other Visit Diagnoses    Screening due    -  Primary   Relevant Orders   HCV Ab w/Rflx to Verification   PSA       No orders of the defined types were placed in this encounter.    Noreene Larsson, NP

## 2020-05-22 NOTE — Patient Instructions (Addendum)
Please have fasting labs drawn at the end of the week or early next week since the A1c can't be drawn today.  With the recent falls, please use your cane especially when you are outside to prevent falls. Also, consider a podiatry consult. With your neuropathy, they may be able to get you custom diabetic shoes, and those may help prevent falls to some degree.   Diabetic Neuropathy Diabetic neuropathy refers to nerve damage that is caused by diabetes. Over time, people with diabetes can develop nerve damage throughout the body. There are several types of diabetic neuropathy:  Peripheral neuropathy. This is the most common type of diabetic neuropathy. It damages the nerves that carry signals between the spinal cord and other parts of the body (peripheral nerves). This usually affects nerves in the feet, legs, hands, and arms.  Autonomic neuropathy. This type causes damage to nerves that control involuntary functions (autonomic nerves). Involuntary functions are functions of the body that you do not control. They include heartbeat, body temperature, blood pressure, urination, digestion, sweating, sexual function, or response to changes in blood glucose.  Focal neuropathy. This type of nerve damage affects one area of the body, such as an arm, a leg, or the face. The injury may involve one nerve or a small group of nerves. Focal neuropathy can be painful and unpredictable. It occurs most often in older adults with diabetes. This often develops suddenly, but usually improves over time and does not cause long-term problems.  Proximal neuropathy. This type of nerve damage affects the nerves of the thighs, hips, buttocks, or legs. It causes severe pain, weakness, and muscle death (atrophy), usually in the thigh muscles. It is more common among older men and people who have type 2 diabetes. The length of recovery time may vary. What are the causes? Peripheral, autonomic, and focal neuropathies are caused by  diabetes that is not well controlled with treatment. The cause of proximal neuropathy is not known, but it may be caused by inflammation related to uncontrolled blood glucose levels. What are the signs or symptoms? Peripheral neuropathy Peripheral neuropathy develops slowly over time. When the nerves of the feet and legs no longer work, you may experience:  Burning, stabbing, or aching pain in the legs or feet.  Pain or cramping in the legs or feet.  Loss of feeling (numbness) and inability to feel pressure or pain in the feet. This can lead to: ? Thick calluses or sores on areas of constant pressure. ? Ulcers. ? Reduced ability to feel temperature changes.  Foot deformities.  Muscle weakness.  Loss of balance or coordination. Autonomic neuropathy The symptoms of autonomic neuropathy vary depending on which nerves are affected. Symptoms may include:  Problems with digestion, such as: ? Nausea or vomiting. ? Poor appetite. ? Bloating. ? Diarrhea or constipation. ? Trouble swallowing. ? Losing weight without trying to.  Problems with the heart, blood, and lungs, such as: ? Dizziness, especially when standing up. ? Fainting. ? Shortness of breath. ? Irregular heartbeat.  Bladder problems, such as: ? Trouble starting or stopping urination. ? Leaking urine. ? Trouble emptying the bladder. ? Urinary tract infections (UTIs).  Problems with other body functions, such as: ? Sweat. You may sweat too much or too little. ? Temperature. You might get hot easily. Or, you might feel cold more than usual. ? Sexual function. Men may not be able to get or maintain an erection. Women may have vaginal dryness and difficulty with arousal. Focal neuropathy Symptoms  affect only one area of the body. Common symptoms include:  Numbness.  Tingling.  Burning pain.  Prickling feeling.  Very sensitive skin.  Weakness.  Inability to move (paralysis).  Muscle twitching.  Muscles  getting smaller (wasting).  Poor coordination.  Double or blurred vision. Proximal neuropathy  Sudden, severe pain in the hip, thigh, or buttocks. Pain may spread from the back into the legs (sciatica).  Pain and numbness in the arms and legs.  Tingling.  Loss of bladder control or bowel control.  Weakness and wasting of thigh muscles.  Difficulty getting up from a seated position.  Abdominal swelling.  Unexplained weight loss. How is this diagnosed? Diagnosis varies depending on the type of neuropathy your health care provider suspects. Peripheral neuropathy Your health care provider will do a neurologic exam. This exam checks your reflexes, how you move, and what you can feel. You may have other tests, such as:  Blood tests.  Tests of the fluid that surrounds the spinal cord (lumbar puncture).  CT scan.  MRI.  Checking the nerves that control muscles (electromyogram, or EMG).  Checking how quickly signals pass through your nerves (nerve conduction study).  Checking a small piece of a nerve using a microscope (biopsy). Autonomic neuropathy You may have tests, such as:  Tests to measure your blood pressure and heart rate. You may be secured to an exam table that moves you from a lying position to an upright position (table tilt test).  Breathing tests to check your lungs.  Tests to check how food moves through the digestive system (gastric emptying tests).  Blood, sweat, or urine tests.  Ultrasound of your bladder.  Spinal fluid tests. Focal neuropathy This condition may be diagnosed with:  A neurologic exam.  CT scan.  MRI.  EMG.  Nerve conduction study. Proximal neuropathy There is no test to diagnose this type of neuropathy. You may have tests to rule out other possible causes of this type of neuropathy. Tests may include:  X-rays of your spine and lumbar region.  Lumbar puncture.  MRI. How is this treated? The goal of treatment is to keep  nerve damage from getting worse. Treatment may include:  Following your diabetes management plan. This will help keep your blood glucose level and your A1C level within your target range. This is the most important treatment.  Using prescription pain medicine. Follow these instructions at home: Diabetes management Follow your diabetes management plan as told by your health care provider.  Check your blood glucose levels.  Keep your blood glucose in your target range.  Have your A1C level checked at least two times a year, or as often as told.  Take over the counter and prescription medicines only as told by your health care provider. This includes insulin and diabetes medicine.   Lifestyle  Do not use any products that contain nicotine or tobacco, such as cigarettes, e-cigarettes, and chewing tobacco. If you need help quitting, ask your health care provider.  Be physically active every day. Include strength training and balance exercises.  Follow a healthy meal plan.  Work with your health care provider to manage your blood pressure.   General instructions  Ask your health care provider if the medicine prescribed to you requires you to avoid driving or using machinery.  Check your skin and feet every day for cuts, bruises, redness, blisters, or sores.  Keep all follow-up visits. This is important. Contact a health care provider if:  You have burning, stabbing,  or aching pain in your legs or feet.  You are unable to feel pressure or pain in your feet.  You develop problems with digestion, such as: ? Nausea. ? Vomiting. ? Bloating. ? Constipation. ? Diarrhea. ? Abdominal pain.  You have difficulty with urination, such as: ? Inability to control when you urinate (incontinence). ? Inability to completely empty the bladder (retention).  You feel as if your heart is racing (palpitations).  You feel dizzy, weak, or faint when you stand up. Get help right away if:  You  cannot urinate.  You have sudden weakness or loss of coordination.  You have trouble speaking.  You have pain or pressure in your chest.  You have an irregular heartbeat.  You have sudden inability to move a part of your body. These symptoms may represent a serious problem that is an emergency. Do not wait to see if the symptoms will go away. Get medical help right away. Call your local emergency services (911 in the [place] Do not drive yourself to the hospital. Summary  Diabetic neuropathy is nerve damage that is caused by diabetes. It can cause numbness and pain in the arms, legs, digestive tract, heart, and other body systems.  This condition is treated by keeping your blood glucose level and your A1C level within your target range. This can help prevent neuropathy from getting worse.  Check your skin and feet every day for cuts, bruises, redness, blisters, or sores.  Do not use any products that contain nicotine or tobacco, such as cigarettes, e-cigarettes, and chewing tobacco. This information is not intended to replace advice given to you by your health care provider. Make sure you discuss any questions you have with your health care provider. Document Revised: 06/24/2019 Document Reviewed: 06/24/2019 Elsevier Patient Education  2021 [employer].

## 2020-05-22 NOTE — Assessment & Plan Note (Addendum)
-  uses PRN albuterol -has used Breo in the past -well controlled

## 2020-05-22 NOTE — Assessment & Plan Note (Signed)
-  will check routine fasting labs, A1c, thyroid panel, and PSA

## 2020-05-24 DIAGNOSIS — E039 Hypothyroidism, unspecified: Secondary | ICD-10-CM | POA: Diagnosis not present

## 2020-05-24 DIAGNOSIS — N529 Male erectile dysfunction, unspecified: Secondary | ICD-10-CM | POA: Diagnosis not present

## 2020-05-24 DIAGNOSIS — E1140 Type 2 diabetes mellitus with diabetic neuropathy, unspecified: Secondary | ICD-10-CM | POA: Diagnosis not present

## 2020-05-24 DIAGNOSIS — I10 Essential (primary) hypertension: Secondary | ICD-10-CM | POA: Diagnosis not present

## 2020-05-24 DIAGNOSIS — Z0001 Encounter for general adult medical examination with abnormal findings: Secondary | ICD-10-CM | POA: Diagnosis not present

## 2020-05-24 DIAGNOSIS — Z1159 Encounter for screening for other viral diseases: Secondary | ICD-10-CM | POA: Diagnosis not present

## 2020-05-24 DIAGNOSIS — Z139 Encounter for screening, unspecified: Secondary | ICD-10-CM | POA: Diagnosis not present

## 2020-05-25 NOTE — Progress Notes (Signed)
A1c is in the prediabetic range at 6.4. No need to add or change medications for diabetes. Don't call until all labs have resulted.

## 2020-05-26 ENCOUNTER — Other Ambulatory Visit: Payer: Self-pay | Admitting: Nurse Practitioner

## 2020-05-26 DIAGNOSIS — E7841 Elevated Lipoprotein(a): Secondary | ICD-10-CM

## 2020-05-26 LAB — CMP14+EGFR
ALT: 27 IU/L (ref 0–44)
AST: 29 IU/L (ref 0–40)
Albumin/Globulin Ratio: 1.4 (ref 1.2–2.2)
Albumin: 4.4 g/dL (ref 3.8–4.9)
Alkaline Phosphatase: 84 IU/L (ref 44–121)
BUN/Creatinine Ratio: 13 (ref 9–20)
BUN: 15 mg/dL (ref 6–24)
Bilirubin Total: 0.4 mg/dL (ref 0.0–1.2)
CO2: 20 mmol/L (ref 20–29)
Calcium: 10.5 mg/dL — ABNORMAL HIGH (ref 8.7–10.2)
Chloride: 102 mmol/L (ref 96–106)
Creatinine, Ser: 1.12 mg/dL (ref 0.76–1.27)
Globulin, Total: 3.2 g/dL (ref 1.5–4.5)
Glucose: 106 mg/dL — ABNORMAL HIGH (ref 65–99)
Potassium: 4.8 mmol/L (ref 3.5–5.2)
Sodium: 140 mmol/L (ref 134–144)
Total Protein: 7.6 g/dL (ref 6.0–8.5)
eGFR: 76 mL/min/{1.73_m2} (ref >59)

## 2020-05-26 LAB — CBC WITH DIFFERENTIAL/PLATELET
Basophils Absolute: 0 10*3/uL (ref 0.0–0.2)
Basos: 1 %
EOS (ABSOLUTE): 0 10*3/uL (ref 0.0–0.4)
Eos: 1 %
Hematocrit: 41.7 % (ref 37.5–51.0)
Hemoglobin: 13.5 g/dL (ref 13.0–17.7)
Immature Grans (Abs): 0 10*3/uL (ref 0.0–0.1)
Immature Granulocytes: 0 %
Lymphocytes Absolute: 1.6 10*3/uL (ref 0.7–3.1)
Lymphs: 29 %
MCH: 27.7 pg (ref 26.6–33.0)
MCHC: 32.4 g/dL (ref 31.5–35.7)
MCV: 86 fL (ref 79–97)
Monocytes Absolute: 0.5 10*3/uL (ref 0.1–0.9)
Monocytes: 10 %
Neutrophils Absolute: 3.3 10*3/uL (ref 1.4–7.0)
Neutrophils: 59 %
Platelets: 286 10*3/uL (ref 150–450)
RBC: 4.87 x10E6/uL (ref 4.14–5.80)
RDW: 15 % (ref 11.6–15.4)
WBC: 5.5 10*3/uL (ref 3.4–10.8)

## 2020-05-26 LAB — LIPID PANEL WITH LDL/HDL RATIO
Cholesterol, Total: 203 mg/dL — ABNORMAL HIGH (ref 100–199)
HDL: 39 mg/dL — ABNORMAL LOW (ref >39)
LDL Chol Calc (NIH): 143 mg/dL — ABNORMAL HIGH (ref 0–99)
LDL/HDL Ratio: 3.7 ratio — ABNORMAL HIGH (ref 0.0–3.6)
Triglycerides: 114 mg/dL (ref 0–149)
VLDL Cholesterol Cal: 21 mg/dL (ref 5–40)

## 2020-05-26 LAB — HCV INTERPRETATION

## 2020-05-26 LAB — MICROALBUMIN / CREATININE URINE RATIO
Creatinine, Urine: 216.4 mg/dL
Microalb/Creat Ratio: 10 mg/g creat (ref 0–29)
Microalbumin, Urine: 22.2 ug/mL

## 2020-05-26 LAB — HEMOGLOBIN A1C
Est. average glucose Bld gHb Est-mCnc: 137 mg/dL
Hgb A1c MFr Bld: 6.4 % — ABNORMAL HIGH (ref 4.8–5.6)

## 2020-05-26 LAB — TSH+FREE T4
Free T4: 1.16 ng/dL (ref 0.82–1.77)
TSH: 3.19 u[IU]/mL (ref 0.450–4.500)

## 2020-05-26 LAB — PSA: Prostate Specific Ag, Serum: 2.3 ng/mL (ref 0.0–4.0)

## 2020-05-26 LAB — HCV AB W/RFLX TO VERIFICATION: HCV Ab: <0.1 s/co ratio (ref 0.0–0.9)

## 2020-05-26 MED ORDER — ROSUVASTATIN CALCIUM 10 MG PO TABS
10.0000 mg | ORAL_TABLET | Freq: Every day | ORAL | 1 refills | Status: DC
Start: 2020-05-26 — End: 2021-02-05

## 2020-05-26 NOTE — Progress Notes (Signed)
I increased his rosuvastatin to 10 mg (was taking 5) for high cholesterol. Otherwise, labs are good.

## 2020-06-09 ENCOUNTER — Other Ambulatory Visit: Payer: Self-pay | Admitting: Family Medicine

## 2020-06-16 ENCOUNTER — Telehealth: Payer: Self-pay | Admitting: Family Medicine

## 2020-06-16 NOTE — Telephone Encounter (Signed)
Spoke with patient to  schedule Medicare Annual Wellness Visit (AWV) either virtually or in office.  He stated he would like a call back in June   AWV-I PER PALMETTO 11/26/15  please schedule at anytime with [facility]  health coach  This should be a 40 minute visit.

## 2020-06-21 ENCOUNTER — Other Ambulatory Visit: Payer: Self-pay | Admitting: Family Medicine

## 2020-07-03 ENCOUNTER — Other Ambulatory Visit: Payer: Self-pay | Admitting: Family Medicine

## 2020-07-25 ENCOUNTER — Ambulatory Visit: Payer: Medicare HMO

## 2020-08-31 ENCOUNTER — Ambulatory Visit: Payer: Medicare HMO | Admitting: Family Medicine

## 2020-09-19 ENCOUNTER — Other Ambulatory Visit: Payer: Self-pay

## 2020-09-19 ENCOUNTER — Encounter: Payer: Self-pay | Admitting: Nurse Practitioner

## 2020-09-19 ENCOUNTER — Ambulatory Visit (INDEPENDENT_AMBULATORY_CARE_PROVIDER_SITE_OTHER): Payer: Medicare HMO | Admitting: Nurse Practitioner

## 2020-09-19 VITALS — BP 129/79 | HR 61 | Temp 97.1°F | Ht 69.0 in | Wt 209.0 lb

## 2020-09-19 DIAGNOSIS — E1140 Type 2 diabetes mellitus with diabetic neuropathy, unspecified: Secondary | ICD-10-CM | POA: Diagnosis not present

## 2020-09-19 DIAGNOSIS — E118 Type 2 diabetes mellitus with unspecified complications: Secondary | ICD-10-CM | POA: Diagnosis not present

## 2020-09-19 DIAGNOSIS — G4700 Insomnia, unspecified: Secondary | ICD-10-CM | POA: Insufficient documentation

## 2020-09-19 DIAGNOSIS — L600 Ingrowing nail: Secondary | ICD-10-CM | POA: Diagnosis not present

## 2020-09-19 DIAGNOSIS — I10 Essential (primary) hypertension: Secondary | ICD-10-CM | POA: Diagnosis not present

## 2020-09-19 DIAGNOSIS — E039 Hypothyroidism, unspecified: Secondary | ICD-10-CM

## 2020-09-19 MED ORDER — CEPHALEXIN 500 MG PO CAPS: 500.0000 mg | ORAL_CAPSULE | Freq: Two times a day (BID) | ORAL | 0 refills | Status: DC

## 2020-09-19 MED ORDER — TRAZODONE HCL 50 MG PO TABS: 25.0000 mg | ORAL_TABLET | Freq: Every evening | ORAL | 3 refills | Status: DC | PRN

## 2020-09-19 NOTE — Assessment & Plan Note (Signed)
-  Rx. Trazodone -med check in 1 month 

## 2020-09-19 NOTE — Assessment & Plan Note (Signed)
BP Readings from Last 3 Encounters:  09/19/20 129/79  05/22/20 134/80  02/22/20 130/72   -no changes to HTN meds

## 2020-09-19 NOTE — Assessment & Plan Note (Signed)
-  checking A1c -she stopped his statin d/t non-compliance -he would like to consider starting GLP-1, may consider med changed based on A1c

## 2020-09-19 NOTE — Patient Instructions (Signed)
Please have labs draw today.

## 2020-09-19 NOTE — Progress Notes (Signed)
Established Patient Office Visit  Subjective:  Patient ID: Justin Klein, male    DOB: 02-19-1961  Age: 60 y.o. MRN: [id number]  CC:  Chief Complaint  Patient presents with   Follow-up   Toe Pain    R foot 2nd toe, ongoing x1 month intermittently. Sharp stabbing pain that will hit and then go away.    HPI Justin Klein presents for lab follow-up.  Left great toe ingrown nail.  Left 2nd toe pain. Right 2nd toe pain, and he has shooting pain. He has pain with movement to second toe. He has upcoming podiatry referral.  Sleeps 4 hours per night and has trouble falling or staying asleep.  Past Medical History:  Diagnosis Date   Asthma    Asthma 08/26/2011   Bilateral leg weakness 07/21/2012   Diabetes mellitus, type 2 ([place])    Essential hypertension 10/27/2019   Hypothyroidism    Ingrown nail of great toe of left foot 05/11/2019   Numbness and tingling of both legs 07/21/2012   Pernicious anemia    PSVT (paroxysmal supraventricular tachycardia) ([place]) 08/26/2011   Pulmonary embolus (HCC)    Diagnosed 9/12 at [facility]   Pulmonary embolus ([place]) 08/26/2011   Seborrheic dermatitis    Strain of muscle, fascia and tendon of lower back, initial encounter 01/24/2020   SVT (supraventricular tachycardia) ([place])    Wart on thumb 08/20/2019    Past Surgical History:  Procedure Laterality Date   Epidermal cyst resection     Left patella tendon repair      Family History  Problem Relation Age of Onset   Diabetes type II Mother    Cancer Father    Diabetes type II Sister     Social History   Socioeconomic History   Marital status: Divorced    Spouse name: Not on file   Number of children: 3   Years of education: Not on file   Highest education level: 12th grade  Occupational History   Not on file  Tobacco Use   Smoking status: Former    Types: Cigarettes   Smokeless tobacco: Never   Tobacco comments:    2012 quit  Substance and Sexual Activity   Alcohol use: No   Drug  use: No   Sexual activity: Yes  Other Topics Concern   Not on file  Social History Narrative   Lives with fiance' Justin Klein    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       Enjoys: music-jazz, reading-john grisham      Diet: no pork, salads daily, [place], uses sugar free sweeters, dark chocolate 70% or higher   Caffeine: decaff-sodas, ginger ale, tea-with truiva    Water: 3-4 cups       Wears seat belt   Smoke detectors at home   Does not use phone while driving    Social Determinants of Radio broadcast assistant Strain: Low Risk    Difficulty of Paying Living Expenses: Not hard at all  Food Insecurity: No Food Insecurity   Worried About Charity fundraiser in the Last Year: Never true   Arboriculturist in the Last Year: Never true  Transportation Needs: No Transportation Needs   Lack of Transportation (Medical): No   Lack of Transportation (Non-Medical): No  Physical Activity: Inactive   Days of Exercise per Week: 0 days   Minutes of Exercise per Session: 0 min  Stress: No Stress Concern Present   Feeling of Stress : Not at all  Social Connections: Moderately Isolated   Frequency of Communication with Friends and Family: More than three times a week   Frequency of Social Gatherings with Friends and Family: Never   Attends Religious Services: Never   Marine scientist or Organizations: No   Attends Music therapist: Never   Marital Status: Living with partner  Intimate Partner Violence: Not At Risk   Fear of Current or Ex-Partner: No   Emotionally Abused: No   Physically Abused: No   Sexually Abused: No    Outpatient Medications Prior to Visit  Medication Sig Dispense Refill   albuterol (VENTOLIN HFA) 108 (90 Base) MCG/ACT inhaler INHALE1 OR 2 PUFFS INTO THE LUNGS EVERY SIX HOURS AS NEEDED FOR WHEEZING OR SHORTNESS OF BREATH. 8.5 g 0   Alcohol Swabs (B-D SINGLE USE SWABS REGULAR) PADS USE AS DIRECTED 100 each 0   diltiazem (CARDIZEM) 30 MG tablet Take 1 tablet (30 mg  total) by mouth daily. 90 tablet 2   fluticasone (CUTIVATE) 0.05 % cream APPLY TO AFFECTED AREASCON FACE UP TO TWICE DAILYTAS NEEDED FOR RASH. 60 g 3   fluticasone furoate-vilanterol (BREO ELLIPTA) 100-25 MCG/INH AEPB Inhale 1 puff into the lungs daily. 90 each 2   gentamicin cream (GARAMYCIN) 0.1 % Apply 1 application topically 2 (two) times daily. 15 g 1   JANUVIA 100 MG tablet Take 1 tablet (100 mg total) by mouth daily. 90 tablet 1   levothyroxine (SYNTHROID) 150 MCG tablet Take 1 tablet (150 mcg total) by mouth every morning. 90 tablet 1   sildenafil (VIAGRA) 100 MG tablet Take 1 tablet (100 mg total) by mouth as needed for erectile dysfunction. 20 tablet 2   TRUE METRIX BLOOD GLUCOSE TEST test strip TEST DAILY 100 strip 3   TRUEplus Lancets 33G MISC TEST DAILY 100 each 0   UNABLE TO FIND True Metrix meter, True Metrix test strips, lancets, control solution, and alcohol swabs. 1 Product 0   rosuvastatin (CRESTOR) 10 MG tablet Take 1 tablet (10 mg total) by mouth daily. (Patient not taking: Reported on 09/19/2020) 90 tablet 1   No facility-administered medications prior to visit.    Allergies  Allergen Reactions   Shellfish Allergy Nausea And Vomiting    Seafood-upset stomach    ROS Review of Systems  Constitutional: Negative.   Respiratory: Negative.    Cardiovascular: Negative.   Musculoskeletal:  Positive for arthralgias.  Psychiatric/Behavioral:  Positive for sleep disturbance. Negative for self-injury and suicidal ideas.      Objective:    Physical Exam Constitutional:      Appearance: Normal appearance.  Cardiovascular:     Rate and Rhythm: Normal rate and regular rhythm.     Pulses: Normal pulses.     Heart sounds: Normal heart sounds.  Pulmonary:     Effort: Pulmonary effort is normal.     Breath sounds: Normal breath sounds.  Musculoskeletal:        General: Tenderness present.     Comments: To right 2nd toe, and left great toe; ingrown nail to left great toe   Neurological:     Mental Status: He is alert.  Psychiatric:        Mood and Affect: Mood normal.        Behavior: Behavior normal.        Thought Content: Thought content normal.        Judgment: Judgment normal.    BP 129/79 (BP Location: Right Arm, Patient Position: Sitting, Cuff Size:  Large)   Pulse 61   Temp (!) 97.1 F (36.2 C) (Temporal)   Ht _0  (1.753 m)   Wt 209 lb (94.8 kg)   SpO2 97%   BMI 30.86 kg/m  Wt Readings from Last 3 Encounters:  09/19/20 209 lb (94.8 kg)  05/22/20 224 lb (101.6 kg)  02/22/20 210 lb (95.3 kg)     Health Maintenance Due  Topic Date Due   OPHTHALMOLOGY EXAM  Never done   Zoster Vaccines- Shingrix (1 of 2) Never done    There are no preventive care reminders to display for this patient.  Lab Results  Component Value Date   TSH 3.190 05/24/2020   Lab Results  Component Value Date   WBC 5.5 05/24/2020   HGB 13.5 05/24/2020   HCT 41.7 05/24/2020   MCV 86 05/24/2020   PLT 286 05/24/2020   Lab Results  Component Value Date   NA 140 05/24/2020   K 4.8 05/24/2020   CO2 20 05/24/2020   GLUCOSE 106 (H) 05/24/2020   BUN 15 05/24/2020   CREATININE 1.12 05/24/2020   BILITOT 0.4 05/24/2020   ALKPHOS 84 05/24/2020   AST 29 05/24/2020   ALT 27 05/24/2020   PROT 7.6 05/24/2020   ALBUMIN 4.4 05/24/2020   CALCIUM 10.5 (H) 05/24/2020   EGFR 76 05/24/2020   Lab Results  Component Value Date   CHOL 203 (H) 05/24/2020   Lab Results  Component Value Date   HDL 39 (L) 05/24/2020   Lab Results  Component Value Date   LDLCALC 143 (H) 05/24/2020   Lab Results  Component Value Date   TRIG 114 05/24/2020   Lab Results  Component Value Date   CHOLHDL 4.5 02/22/2020   Lab Results  Component Value Date   HGBA1C 6.4 (H) 05/24/2020      Assessment & Plan:   Problem List Items Addressed This Visit       Cardiovascular and Mediastinum   Essential hypertension    BP Readings from Last 3 Encounters:  09/19/20 129/79   05/22/20 134/80  02/22/20 130/72  -no changes to HTN meds      Relevant Orders   CBC with Differential/Platelet   CMP14+EGFR   Lipid Panel With LDL/HDL Ratio     Endocrine   Type 2 diabetes mellitus with diabetic neuropathy, without long-term current use of insulin (HCC) - Primary    -checking A1c -she stopped his statin d/t non-compliance -he would like to consider starting GLP-1, may consider med changed based on A1c       Relevant Orders   Lipid Panel With LDL/HDL Ratio   Hemoglobin A1c   Hypothyroidism   Relevant Orders   TSH + free T4     Musculoskeletal and Integument   Ingrown left big toenail    -has upcoming podiatry appt next week -Rx. Keflex        Relevant Medications   cephALEXin (KEFLEX) 500 MG capsule     Other   Persistent disorder of initiating or maintaining sleep    -Rx. Trazodone -med check in 1 month       Relevant Medications   traZODone (DESYREL) 50 MG tablet   Other Visit Diagnoses     Controlled type 2 diabetes mellitus with complication, without long-term current use of insulin ([place])       Relevant Orders   CBC with Differential/Platelet   CMP14+EGFR   Lipid Panel With LDL/HDL Ratio   Hemoglobin A1c  Meds ordered this encounter  Medications   cephALEXin (KEFLEX) 500 MG capsule    Sig: Take 1 capsule (500 mg total) by mouth 2 (two) times daily.    Dispense:  20 capsule    Refill:  0   traZODone (DESYREL) 50 MG tablet    Sig: Take 0.5-1 tablets (25-50 mg total) by mouth at bedtime as needed for sleep.    Dispense:  30 tablet    Refill:  3    Follow-up: Return in about 1 month (around 10/20/2020) for Med check (sertraline, januvia).    Noreene Larsson, NP

## 2020-09-19 NOTE — Assessment & Plan Note (Signed)
-  has upcoming podiatry appt next week -Rx. Keflex

## 2020-09-20 LAB — LIPID PANEL WITH LDL/HDL RATIO
Cholesterol, Total: 173 mg/dL (ref 100–199)
HDL: 41 mg/dL (ref >39)
LDL Chol Calc (NIH): 120 mg/dL — ABNORMAL HIGH (ref 0–99)
LDL/HDL Ratio: 2.9 ratio (ref 0.0–3.6)
Triglycerides: 62 mg/dL (ref 0–149)
VLDL Cholesterol Cal: 12 mg/dL (ref 5–40)

## 2020-09-20 LAB — CMP14+EGFR
ALT: 12 IU/L (ref 0–44)
AST: 24 IU/L (ref 0–40)
Albumin/Globulin Ratio: 1.6 (ref 1.2–2.2)
Albumin: 4.4 g/dL (ref 3.8–4.9)
Alkaline Phosphatase: 74 IU/L (ref 44–121)
BUN/Creatinine Ratio: 13 (ref 9–20)
BUN: 15 mg/dL (ref 6–24)
Bilirubin Total: 0.3 mg/dL (ref 0.0–1.2)
CO2: 23 mmol/L (ref 20–29)
Calcium: 10.2 mg/dL (ref 8.7–10.2)
Chloride: 102 mmol/L (ref 96–106)
Creatinine, Ser: 1.14 mg/dL (ref 0.76–1.27)
Globulin, Total: 2.8 g/dL (ref 1.5–4.5)
Glucose: 107 mg/dL — ABNORMAL HIGH (ref 65–99)
Potassium: 4.9 mmol/L (ref 3.5–5.2)
Sodium: 138 mmol/L (ref 134–144)
Total Protein: 7.2 g/dL (ref 6.0–8.5)
eGFR: 74 mL/min/{1.73_m2} (ref >59)

## 2020-09-20 LAB — CBC WITH DIFFERENTIAL/PLATELET
Basophils Absolute: 0.1 10*3/uL (ref 0.0–0.2)
Basos: 2 %
EOS (ABSOLUTE): 0.1 10*3/uL (ref 0.0–0.4)
Eos: 3 %
Hematocrit: 39.3 % (ref 37.5–51.0)
Hemoglobin: 13.2 g/dL (ref 13.0–17.7)
Immature Grans (Abs): 0 10*3/uL (ref 0.0–0.1)
Immature Granulocytes: 0 %
Lymphocytes Absolute: 1.1 10*3/uL (ref 0.7–3.1)
Lymphs: 28 %
MCH: 28.7 pg (ref 26.6–33.0)
MCHC: 33.6 g/dL (ref 31.5–35.7)
MCV: 85 fL (ref 79–97)
Monocytes Absolute: 0.4 10*3/uL (ref 0.1–0.9)
Monocytes: 10 %
Neutrophils Absolute: 2.3 10*3/uL (ref 1.4–7.0)
Neutrophils: 57 %
Platelets: 245 10*3/uL (ref 150–450)
RBC: 4.6 x10E6/uL (ref 4.14–5.80)
RDW: 15 % (ref 11.6–15.4)
WBC: 4 10*3/uL (ref 3.4–10.8)

## 2020-09-20 LAB — HEMOGLOBIN A1C
Est. average glucose Bld gHb Est-mCnc: 131 mg/dL
Hgb A1c MFr Bld: 6.2 % — ABNORMAL HIGH (ref 4.8–5.6)

## 2020-09-20 LAB — TSH+FREE T4
Free T4: 1.17 ng/dL (ref 0.82–1.77)
TSH: 3.89 u[IU]/mL (ref 0.450–4.500)

## 2020-09-20 NOTE — Progress Notes (Signed)
A1c looks great. LDL, or bad cholesterol, is slightly elevated. Other labs look great.

## 2020-09-25 ENCOUNTER — Ambulatory Visit: Payer: Medicare HMO | Admitting: Podiatry

## 2020-09-25 ENCOUNTER — Other Ambulatory Visit: Payer: Self-pay

## 2020-09-25 DIAGNOSIS — L600 Ingrowing nail: Secondary | ICD-10-CM | POA: Diagnosis not present

## 2020-09-25 MED ORDER — GENTAMICIN SULFATE 0.1 % EX CREA: 1.0000 "application " | TOPICAL_CREAM | Freq: Two times a day (BID) | CUTANEOUS | 1 refills | Status: DC

## 2020-09-25 NOTE — Progress Notes (Signed)
   Subjective: Patient presents today for evaluation of pain to the lateral border of the left great toe.  Patient does have a history of total temporary nail avulsion to the same toe.. Patient is concerned for possible ingrown nail.  It is very sensitive to touch.  He states that when the nail was removed completely it did feel better for several months.  Patient presents today for further treatment and evaluation.  Past Medical History:  Diagnosis Date   Asthma    Asthma 08/26/2011   Bilateral leg weakness 07/21/2012   Diabetes mellitus, type 2 (HCC)    Essential hypertension 10/27/2019   Hypothyroidism    Ingrown nail of great toe of left foot 05/11/2019   Numbness and tingling of both legs 07/21/2012   Pernicious anemia    PSVT (paroxysmal supraventricular tachycardia) (HCC) 08/26/2011   Pulmonary embolus (HCC)    Diagnosed 9/12 at [place]   Pulmonary embolus (HCC) 08/26/2011   Seborrheic dermatitis    Strain of muscle, fascia and tendon of lower back, initial encounter 01/24/2020   SVT (supraventricular tachycardia) (HCC)    Wart on thumb 08/20/2019    Objective:  General: Well developed, nourished, in no acute distress, alert and oriented x3   Dermatology: Skin is warm, dry and supple bilateral.  Lateral border left great toe appears to be erythematous with evidence of an ingrowing nail. Pain on palpation noted to the border of the nail fold. The remaining nails appear unremarkable at this time. There are no open sores, lesions.  Pain across the entire nail plate  Vascular: Dorsalis Pedis artery and Posterior Tibial artery pedal pulses palpable. No lower extremity edema noted.   Neruologic: Grossly intact via light touch bilateral.  Musculoskeletal: Muscular strength within normal limits in all groups bilateral. Normal range of motion noted to all pedal and ankle joints.   Assesement: #1 Paronychia with ingrowing nail lateral border left great toe #2  Symptomatic dystrophic nail  plate left hallux  Plan of Care:  1. Patient evaluated.  2. Discussed treatment alternatives and plan of care. Explained nail avulsion procedure and post procedure course to patient. 3. Patient opted for permanent total nail avulsion of the left hallux.  4. Prior to procedure, local anesthesia infiltration utilized using 3 ml of a 50:50 mixture of 2% plain lidocaine and 0.5% plain marcaine in a normal hallux block fashion and a betadine prep performed.  5. Partial permanent nail avulsion with chemical matrixectomy performed using 3x30sec applications of phenol followed by alcohol flush.  6. Light dressing applied.  Post care instructions provided 7.  Prescription for gentamicin 2% cream  8.  Return to clinic 3 weeks.  Felecia Shelling, DPM [facility]  Dr. Felecia Shelling, DPM    [address] [place], [place] [address]                Office [phone]  Fax [phone]

## 2020-09-25 NOTE — Patient Instructions (Signed)

## 2020-09-26 ENCOUNTER — Telehealth: Payer: Self-pay | Admitting: Nurse Practitioner

## 2020-09-26 NOTE — Telephone Encounter (Signed)
Pt informed

## 2020-09-26 NOTE — Telephone Encounter (Signed)
Pt just wanted to let you know that he reviewed this medication before taking it and seen that it's also used as an anti-depressant so he decided not to take it altogether. He said he will still see you at the follow up visit next month.

## 2020-09-26 NOTE — Telephone Encounter (Signed)
Pt states that the he is not taking the Trazadone, as its a anti-depressent.

## 2020-09-26 NOTE — Telephone Encounter (Signed)
It is an antidepressant in it's higher dosages (like over 300 mg), but is used for insomnia in the 50, 100, and 150 mg doses.

## 2020-10-05 ENCOUNTER — Encounter: Payer: Self-pay | Admitting: Podiatry

## 2020-10-12 ENCOUNTER — Other Ambulatory Visit: Payer: Self-pay | Admitting: Nurse Practitioner

## 2020-10-12 DIAGNOSIS — L600 Ingrowing nail: Secondary | ICD-10-CM

## 2020-10-16 ENCOUNTER — Other Ambulatory Visit: Payer: Self-pay

## 2020-10-16 ENCOUNTER — Ambulatory Visit (INDEPENDENT_AMBULATORY_CARE_PROVIDER_SITE_OTHER): Payer: Medicare HMO | Admitting: Podiatry

## 2020-10-16 DIAGNOSIS — L600 Ingrowing nail: Secondary | ICD-10-CM | POA: Diagnosis not present

## 2020-10-16 MED ORDER — CEPHALEXIN 500 MG PO CAPS: 500.0000 mg | ORAL_CAPSULE | Freq: Two times a day (BID) | ORAL | 0 refills | Status: DC

## 2020-10-16 NOTE — Progress Notes (Signed)
   Subjective: 60 y.o. male presenting today status post total permanent nail avulsion procedure of the left great toe that was performed on 09/25/2020.  Patient states that he is feeling well.  He has been soaking his foot and taking antibiotics as prescribed.  No new complaints at this time.  Past Medical History:  Diagnosis Date   Asthma    Asthma 08/26/2011   Bilateral leg weakness 07/21/2012   Diabetes mellitus, type 2 (HCC)    Essential hypertension 10/27/2019   Hypothyroidism    Ingrown nail of great toe of left foot 05/11/2019   Numbness and tingling of both legs 07/21/2012   Pernicious anemia    PSVT (paroxysmal supraventricular tachycardia) (HCC) 08/26/2011   Pulmonary embolus (HCC)    Diagnosed 9/12 at [place]   Pulmonary embolus (HCC) 08/26/2011   Seborrheic dermatitis    Strain of muscle, fascia and tendon of lower back, initial encounter 01/24/2020   SVT (supraventricular tachycardia) (HCC)    Wart on thumb 08/20/2019     Objective: Skin is warm, dry and supple. Nail bed and respective nail fold appears to be healing appropriately. Open wound to the associated nail fold with a granular wound base and moderate amount of fibrotic tissue. Minimal drainage noted. Mild erythema around the periungual region likely due to phenol chemical matricectomy.  Assessment: #1 postop permanent total nail avulsion left great toe  Plan of care: #1 patient was evaluated  #2 light debridement of open wound was performed to the periungual border of the respective toe using a currette and tissue nipper. Antibiotic ointment and Band-Aid was applied. #3  Refill prescription for Keflex 500 mg 3 times daily x7 days for prophylaxis  #4 patient is to return to clinic on a PRN  basis.   Felecia Shelling, DPM [facility]  Dr. Felecia Shelling, DPM    [address] [place], [place] [address]                Office [phone]  Fax [phone]

## 2020-10-23 ENCOUNTER — Ambulatory Visit: Payer: Medicare HMO | Admitting: Nurse Practitioner

## 2020-11-21 ENCOUNTER — Ambulatory Visit (INDEPENDENT_AMBULATORY_CARE_PROVIDER_SITE_OTHER): Payer: Medicare HMO

## 2020-11-21 ENCOUNTER — Other Ambulatory Visit: Payer: Self-pay

## 2020-11-21 DIAGNOSIS — Z0000 Encounter for general adult medical examination without abnormal findings: Secondary | ICD-10-CM

## 2020-11-21 NOTE — Progress Notes (Signed)
Subjective:   Justin Klein is a 60 y.o. male who presents for an Initial Medicare Annual Wellness Visit.I connected with  Justin Klein on 11/21/20 by a audio enabled telemedicine application and verified that I am speaking with the correct person using two identifiers.  Patient Location: Home  Provider Location: Home Office  I discussed the limitations of evaluation and management by telemedicine. The patient expressed understanding and agreed to proceed.   Review of Systems    Defer to PCP       Objective:    There were no vitals filed for this visit. There is no height or weight on file to calculate BMI.  Advanced Directives 11/21/2020  Does Patient Have a Medical Advance Directive? No    Current Medications (verified) Outpatient Encounter Medications as of 11/21/2020  Medication Sig   albuterol (VENTOLIN HFA) 108 (90 Base) MCG/ACT inhaler INHALE1 OR 2 PUFFS INTO THE LUNGS EVERY SIX HOURS AS NEEDED FOR WHEEZING OR SHORTNESS OF BREATH.   Alcohol Swabs (B-D SINGLE USE SWABS REGULAR) PADS USE AS DIRECTED   cephALEXin (KEFLEX) 500 MG capsule Take 1 capsule (500 mg total) by mouth 2 (two) times daily.   diltiazem (CARDIZEM) 30 MG tablet Take 1 tablet (30 mg total) by mouth daily.   fluticasone (CUTIVATE) 0.05 % cream APPLY TO AFFECTED AREASCON FACE UP TO TWICE DAILYTAS NEEDED FOR RASH.   fluticasone furoate-vilanterol (BREO ELLIPTA) 100-25 MCG/INH AEPB Inhale 1 puff into the lungs daily.   gentamicin cream (GARAMYCIN) 0.1 % Apply 1 application topically 2 (two) times daily.   JANUVIA 100 MG tablet Take 1 tablet (100 mg total) by mouth daily.   levothyroxine (SYNTHROID) 150 MCG tablet Take 1 tablet (150 mcg total) by mouth every morning.   sildenafil (VIAGRA) 100 MG tablet Take 1 tablet (100 mg total) by mouth as needed for erectile dysfunction.   TRUE METRIX BLOOD GLUCOSE TEST test strip TEST DAILY   TRUEplus Lancets 33G MISC TEST DAILY   UNABLE TO FIND True Metrix meter,  True Metrix test strips, lancets, control solution, and alcohol swabs.   rosuvastatin (CRESTOR) 10 MG tablet Take 1 tablet (10 mg total) by mouth daily. (Patient not taking: No sig reported)   traZODone (DESYREL) 50 MG tablet Take 0.5-1 tablets (25-50 mg total) by mouth at bedtime as needed for sleep. (Patient not taking: Reported on 11/21/2020)   [DISCONTINUED] gentamicin ointment (GARAMYCIN) 0.1 % Apply topically 2 (two) times daily.   No facility-administered encounter medications on file as of 11/21/2020.    Allergies (verified) Shellfish allergy   History: Past Medical History:  Diagnosis Date   Asthma    Asthma 08/26/2011   Bilateral leg weakness 07/21/2012   Diabetes mellitus, type 2 (HCC)    Essential hypertension 10/27/2019   Hypothyroidism    Ingrown nail of great toe of left foot 05/11/2019   Numbness and tingling of both legs 07/21/2012   Pernicious anemia    PSVT (paroxysmal supraventricular tachycardia) (HCC) 08/26/2011   Pulmonary embolus (HCC)    Diagnosed 9/12 at [facility]   Pulmonary embolus (HCC) 08/26/2011   Seborrheic dermatitis    Strain of muscle, fascia and tendon of lower back, initial encounter 01/24/2020   SVT (supraventricular tachycardia) (HCC)    Wart on thumb 08/20/2019   Past Surgical History:  Procedure Laterality Date   Epidermal cyst resection     Left patella tendon repair     Family History  Problem Relation Age of Onset  Diabetes type II Mother    Cancer Father    Diabetes type II Sister    Social History   Socioeconomic History   Marital status: Divorced    Spouse name: Not on file   Number of children: 3   Years of education: Not on file   Highest education level: 12th grade  Occupational History   Not on file  Tobacco Use   Smoking status: Former    Types: Cigarettes   Smokeless tobacco: Never   Tobacco comments:    2012 quit  Substance and Sexual Activity   Alcohol use: No   Drug use: No   Sexual activity: Yes  Other Topics  Concern   Not on file  Social History Narrative   Lives with fiance' Justin Klein    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       Enjoys: music-jazz, reading-john grisham      Diet: no pork, salads daily, [place], uses sugar free sweeters, dark chocolate 70% or higher   Caffeine: decaff-sodas, ginger ale, tea-with truiva    Water: 3-4 cups       Wears seat belt   Smoke detectors at home   Does not use phone while driving    Social Determinants of Corporate investment banker Strain: Low Risk    Difficulty of Paying Living Expenses: Not very hard  Food Insecurity: No Food Insecurity   Worried About Programme researcher, broadcasting/film/video in the Last Year: Never true   Ran Out of Food in the Last Year: Never true  Transportation Needs: No Transportation Needs   Lack of Transportation (Medical): No   Lack of Transportation (Non-Medical): No  Physical Activity: Inactive   Days of Exercise per Week: 0 days   Minutes of Exercise per Session: 0 min  Stress: No Stress Concern Present   Feeling of Stress : Not at all  Social Connections: Moderately Isolated   Frequency of Communication with Friends and Family: More than three times a week   Frequency of Social Gatherings with Friends and Family: Once a week   Attends Religious Services: More than 4 times per year   Active Member of [employer] or Organizations: No   Attends Banker Meetings: Never   Marital Status: Divorced    Tobacco Counseling Counseling given: Not Answered Tobacco comments: 2012 quit   Clinical Intake:  Pre-visit preparation completed: Yes  Pain : No/denies pain        How often do you need to have someone help you when you read instructions, pamphlets, or other written materials from your doctor or pharmacy?: 1 - Never What is the last grade level you completed in school?: 12TH  Diabetic?YES Nutrition Risk Assessment:  Has the patient had any N/V/D within the last 2 months?  No  Does the patient have any non-healing wounds?  No  Has  the patient had any unintentional weight loss or weight gain?  No   Diabetes:  Is the patient diabetic?  Yes  If diabetic, was a CBG obtained today?  Yes  Did the patient bring in their glucometer from home?   N/A How often do you monitor your CBG's? Daily.   Financial Strains and Diabetes Management:  Are you having any financial strains with the device, your supplies or your medication? No .  Does the patient want to be seen by Chronic Care Management for management of their diabetes?  No  Would the patient like to be referred to a Nutritionist or for Diabetic  Management?  No   Diabetic Exams:  Diabetic Eye Exam: Overdue for diabetic eye exam. Pt has been advised about the importance in completing this exam. Patient advised to call and schedule an eye exam. Diabetic Foot Exam: Completed 05/22/2020    Interpreter Needed?: No  Information entered by :: Justin Klein,CMA   Activities of Daily Living In your present state of health, do you have any difficulty performing the following activities: 11/21/2020  Hearing? N  Vision? N  Difficulty concentrating or making decisions? N  Walking or climbing stairs? N  Dressing or bathing? N  Doing errands, shopping? N  Preparing Food and eating ? N  Using the Toilet? N  In the past six months, have you accidently leaked urine? N  Do you have problems with loss of bowel control? N  Managing your Medications? N  Managing your Finances? N  Housekeeping or managing your Housekeeping? N  Some recent data might be hidden    Patient Care Team: Heather Roberts, NP as PCP - General (Nurse Practitioner)  Indicate any recent Medical Services you may have received from other than [facility] providers in the past year (date may be approximate).     Assessment:   This is a routine wellness examination for [place].  Hearing/Vision screen No results found.  Dietary issues and exercise activities discussed: Current Exercise Habits: The patient does not  participate in regular exercise at present   Goals Addressed   None    Depression Screen PHQ 2/9 Scores 11/21/2020 09/19/2020 05/22/2020 02/22/2020 02/22/2020 01/24/2020 10/27/2019  PHQ - 2 Score 0 0 0 0 0 0 0    Fall Risk Fall Risk  11/21/2020 09/19/2020 05/22/2020 02/22/2020 01/24/2020  Falls in the past year? 1 1 1 1  0  Number falls in past yr: 1 1 1 1  0  Injury with Fall? 0 0 0 0 0  Risk for fall due to : Impaired balance/gait Impaired balance/gait Impaired balance/gait Impaired balance/gait No Fall Risks  Follow up Falls evaluation completed Falls evaluation completed Falls evaluation completed Falls evaluation completed Falls evaluation completed    FALL RISK PREVENTION PERTAINING TO THE HOME:  Any stairs in or around the home? Yes  If so, are there any without handrails? No  Home free of loose throw rugs in walkways, pet beds, electrical cords, etc? Yes  Adequate lighting in your home to reduce risk of falls? Yes   ASSISTIVE DEVICES UTILIZED TO PREVENT FALLS:  Life alert? No  Use of a cane, walker or w/c? Yes  Grab bars in the bathroom? Yes  Shower chair or bench in shower? Yes  Elevated toilet seat or a handicapped toilet? No   TIMED UP AND GO:  Was the test performed?  N/A .  Length of time to ambulate 10 feet: N/A sec.     Cognitive Function:     6CIT Screen 11/21/2020  What Year? 0 points  What month? 0 points  What time? 0 points  Count back from 20 0 points  Months in reverse 0 points  Repeat phrase 0 points  Total Score 0    Immunizations Immunization History  Administered Date(s) Administered   Janssen (Klein&Klein) SARS-COV-2 Vaccination 06/01/2019   Moderna Sars-Covid-2 Vaccination 01/07/2020    TDAP status: Due, Education has been provided regarding the importance of this vaccine. Advised may receive this vaccine at local pharmacy or Health Dept. Aware to provide a copy of the vaccination record if obtained from local pharmacy or Health Dept.  Verbalized  acceptance and understanding.  Flu Vaccine status: Due, Education has been provided regarding the importance of this vaccine. Advised may receive this vaccine at local pharmacy or Health Dept. Aware to provide a copy of the vaccination record if obtained from local pharmacy or Health Dept. Verbalized acceptance and understanding.    Covid-19 vaccine status: Information provided on how to obtain vaccines.   Qualifies for Shingles Vaccine? Yes   Zostavax completed No   Shingrix Completed?: No.    Education has been provided regarding the importance of this vaccine. Patient has been advised to call insurance company to determine out of pocket expense if they have not yet received this vaccine. Advised may also receive vaccine at local pharmacy or Health Dept. Verbalized acceptance and understanding.  Screening Tests Health Maintenance  Topic Date Due   OPHTHALMOLOGY EXAM  Never done   Zoster Vaccines- Shingrix (1 of 2) Never done   COVID-19 Vaccine (3 - Booster for Janssen series) 05/06/2020   INFLUENZA VACCINE  Never done   TETANUS/TDAP  02/21/2021 (Originally 10/20/1979)   HIV Screening  02/21/2021 (Originally 10/20/1975)   HEMOGLOBIN A1C  03/22/2021   FOOT EXAM  05/22/2021   URINE MICROALBUMIN  05/24/2021   COLONOSCOPY (Pts 45-26yrs Insurance coverage will need to be confirmed)  06/06/2021   Hepatitis C Screening  Completed   HPV VACCINES  Aged Out    Health Maintenance  Health Maintenance Due  Topic Date Due   OPHTHALMOLOGY EXAM  Never done   Zoster Vaccines- Shingrix (1 of 2) Never done   COVID-19 Vaccine (3 - Booster for Janssen series) 05/06/2020   INFLUENZA VACCINE  Never done    Colorectal cancer screening: Type of screening: Colonoscopy. Completed 06/07/2011. Repeat every 10 years  Lung Cancer Screening: (Low Dose CT Chest recommended if Age 90-80 years, 30 pack-year currently smoking OR have quit w/in 15years.) does qualify.   Lung Cancer Screening Referral:  no  Additional Screening:  Hepatitis C Screening: does qualify; Completed 05/24/2020  Vision Screening: Recommended annual ophthalmology exams for early detection of glaucoma and other disorders of the eye. Is the patient up to date with their annual eye exam?  No  Who is the provider or what is the name of the office in which the patient attends annual eye exams? Dr. Senaida Ores in [place] If pt is not established with a provider, would they like to be referred to a provider to establish care? No .   Dental Screening: Recommended annual dental exams for proper oral hygiene  Community Resource Referral / Chronic Care Management: CRR required this visit?  No   CCM required this visit?  No      Plan:     I have personally reviewed and noted the following in the patient's chart:   Medical and social history Use of alcohol, tobacco or illicit drugs  Current medications and supplements including opioid prescriptions. Patient is not currently taking opioid prescriptions. Functional ability and status Nutritional status Physical activity Advanced directives List of other physicians Hospitalizations, surgeries, and ER visits in previous 12 months Vitals Screenings to include cognitive, depression, and falls Referrals and appointments  In addition, I have reviewed and discussed with patient certain preventive protocols, quality metrics, and best practice recommendations. A written personalized care plan for preventive services as well as general preventive health recommendations were provided to patient.     Justin Klein, [facility]   11/21/2020   Nurse Notes: Non Face to Face 30 minute visit  Justin Klein , Thank you for taking time to come for your Medicare Wellness Visit. I appreciate your ongoing commitment to your health goals. Please review the following plan we discussed and let me know if I can assist you in the future.   These are the goals we discussed:  Goals    None     This is a list of the screening recommended for you and due dates:  Health Maintenance  Topic Date Due   Eye exam for diabetics  Never done   Zoster (Shingles) Vaccine (1 of 2) Never done   COVID-19 Vaccine (3 - Booster for [employer] series) 05/06/2020   Flu Shot  Never done   Tetanus Vaccine  02/21/2021*   HIV Screening  02/21/2021*   Hemoglobin A1C  03/22/2021   Complete foot exam   05/22/2021   Urine Protein Check  05/24/2021   Colon Cancer Screening  06/06/2021   Hepatitis C Screening: USPSTF Recommendation to screen - Ages 18-79 yo.  Completed   HPV Vaccine  Aged Out  *Topic was postponed. The date shown is not the original due date.

## 2020-11-21 NOTE — Patient Instructions (Signed)
Health Maintenance, Male Adopting a healthy lifestyle and getting preventive care are important in promoting health and wellness. Ask your health care provider about: The right schedule for you to have regular tests and exams. Things you can do on your own to prevent diseases and keep yourself healthy. What should I know about diet, weight, and exercise? Eat a healthy diet  Eat a diet that includes plenty of vegetables, fruits, low-fat dairy products, and lean protein. Do not eat a lot of foods that are high in solid fats, added sugars, or sodium. Maintain a healthy weight Body mass index (BMI) is a measurement that can be used to identify possible weight problems. It estimates body fat based on height and weight. Your health care provider can help determine your BMI and help you achieve or maintain a healthy weight. Get regular exercise Get regular exercise. This is one of the most important things you can do for your health. Most adults should: Exercise for at least 150 minutes each week. The exercise should increase your heart rate and make you sweat (moderate-intensity exercise). Do strengthening exercises at least twice a week. This is in addition to the moderate-intensity exercise. Spend less time sitting. Even light physical activity can be beneficial. Watch cholesterol and blood lipids Have your blood tested for lipids and cholesterol at 60 years of age, then have this test every 5 years. You may need to have your cholesterol levels checked more often if: Your lipid or cholesterol levels are high. You are older than 60 years of age. You are at high risk for heart disease. What should I know about cancer screening? Many types of cancers can be detected early and may often be prevented. Depending on your health history and family history, you may need to have cancer screening at various ages. This may include screening for: Colorectal cancer. Prostate cancer. Skin cancer. Lung  cancer. What should I know about heart disease, diabetes, and high blood pressure? Blood pressure and heart disease High blood pressure causes heart disease and increases the risk of stroke. This is more likely to develop in people who have high blood pressure readings, are of African descent, or are overweight. Talk with your health care provider about your target blood pressure readings. Have your blood pressure checked: Every 3-5 years if you are 18-39 years of age. Every year if you are 40 years old or older. If you are between the ages of 65 and 75 and are a current or former smoker, ask your health care provider if you should have a one-time screening for abdominal aortic aneurysm (AAA). Diabetes Have regular diabetes screenings. This checks your fasting blood sugar level. Have the screening done: Once every three years after age 45 if you are at a normal weight and have a low risk for diabetes. More often and at a younger age if you are overweight or have a high risk for diabetes. What should I know about preventing infection? Hepatitis B If you have a higher risk for hepatitis B, you should be screened for this virus. Talk with your health care provider to find out if you are at risk for hepatitis B infection. Hepatitis C Blood testing is recommended for: Everyone born from 1945 through 1965. Anyone with known risk factors for hepatitis C. Sexually transmitted infections (STIs) You should be screened each year for STIs, including gonorrhea and chlamydia, if: You are sexually active and are younger than 60 years of age. You are older than 60 years   of age and your health care provider tells you that you are at risk for this type of infection. Your sexual activity has changed since you were last screened, and you are at increased risk for chlamydia or gonorrhea. Ask your health care provider if you are at risk. Ask your health care provider about whether you are at high risk for HIV.  Your health care provider may recommend a prescription medicine to help prevent HIV infection. If you choose to take medicine to prevent HIV, you should first get tested for HIV. You should then be tested every 3 months for as long as you are taking the medicine. Follow these instructions at home: Lifestyle Do not use any products that contain nicotine or tobacco, such as cigarettes, e-cigarettes, and chewing tobacco. If you need help quitting, ask your health care provider. Do not use street drugs. Do not share needles. Ask your health care provider for help if you need support or information about quitting drugs. Alcohol use Do not drink alcohol if your health care provider tells you not to drink. If you drink alcohol: Limit how much you have to 0-2 drinks a day. Be aware of how much alcohol is in your drink. In the [place] one drink equals one 12 oz bottle of beer (355 mL), one 5 oz glass of wine (148 mL), or one 1 oz glass of hard liquor (44 mL). General instructions Schedule regular health, dental, and eye exams. Stay current with your vaccines. Tell your health care provider if: You often feel depressed. You have ever been abused or do not feel safe at home. Summary Adopting a healthy lifestyle and getting preventive care are important in promoting health and wellness. Follow your health care provider's instructions about healthy diet, exercising, and getting tested or screened for diseases. Follow your health care provider's instructions on monitoring your cholesterol and blood pressure. This information is not intended to replace advice given to you by your health care provider. Make sure you discuss any questions you have with your health care provider. Document Revised: 04/21/2020 Document Reviewed: 02/04/2018 Elsevier Patient Education  2022 Elsevier Inc.  

## 2020-11-22 ENCOUNTER — Telehealth: Payer: Self-pay | Admitting: *Deleted

## 2020-11-22 ENCOUNTER — Encounter: Payer: Self-pay | Admitting: Nurse Practitioner

## 2020-11-22 ENCOUNTER — Ambulatory Visit (INDEPENDENT_AMBULATORY_CARE_PROVIDER_SITE_OTHER): Payer: Medicare HMO | Admitting: Nurse Practitioner

## 2020-11-22 ENCOUNTER — Ambulatory Visit: Payer: Medicare HMO

## 2020-11-22 VITALS — BP 138/81 | HR 60 | Temp 98.2°F | Ht 68.0 in | Wt 207.1 lb

## 2020-11-22 DIAGNOSIS — I10 Essential (primary) hypertension: Secondary | ICD-10-CM | POA: Diagnosis not present

## 2020-11-22 DIAGNOSIS — E1140 Type 2 diabetes mellitus with diabetic neuropathy, unspecified: Secondary | ICD-10-CM | POA: Diagnosis not present

## 2020-11-22 DIAGNOSIS — G4700 Insomnia, unspecified: Secondary | ICD-10-CM | POA: Diagnosis not present

## 2020-11-22 MED ORDER — GLIPIZIDE ER 5 MG PO TB24: 5.0000 mg | ORAL_TABLET | Freq: Every day | ORAL | 1 refills | Status: DC

## 2020-11-22 NOTE — Chronic Care Management (AMB) (Signed)
  Chronic Care Management   Note  11/22/2020 Name: PHIL MICHELS MRN: [id number] DOB: 1960/10/21  BOCEPHUS CALI is a 60 y.o. year old male who is a primary care patient of Noreene Larsson, NP. I reached out to Modena Slater by phone today in response to a referral sent by Mr. Kailan Laws Roll's PCP.  Mr. Smigiel was given information about Chronic Care Management services today including:  CCM service includes personalized support from designated clinical staff supervised by his physician, including individualized plan of care and coordination with other care providers 24/7 contact phone numbers for assistance for urgent and routine care needs. Service will only be billed when office clinical staff spend 20 minutes or more in a month to coordinate care. Only one practitioner may furnish and bill the service in a calendar month. The patient may stop CCM services at any time (effective at the end of the month) by phone call to the office staff. The patient is responsible for co-pay (up to 20% after annual deductible is met) if co-pay is required by the individual health plan.   Patient agreed to services and verbal consent obtained.   Follow up plan: Telephone appointment with care management team member scheduled for:12/12/20  [facility]  Direct Dial: [phone]

## 2020-11-22 NOTE — Assessment & Plan Note (Signed)
-  he didn't try trazodone d/t it being a psych med -encouraged him to try it since it isn't habit forming and is a low dose -he is in agreement

## 2020-11-22 NOTE — Assessment & Plan Note (Signed)
BP Readings from Last 3 Encounters:  11/22/20 138/81  09/19/20 129/79  05/22/20 134/80   -well controlled

## 2020-11-22 NOTE — Assessment & Plan Note (Addendum)
Lab Results  Component Value Date   HGBA1C 6.2 (H) 09/19/2020   -doing well with [place], but he states it is too expensive; he is paying over $130/mo now that he is in the donut hole; is taking meds every other day d/t costs -referral to phramacy -goal is to get him on trulicity -he is out of januvia (100 mg daily) now -Rx. Glipizide -hopefully will stop the glipizide when he gets assistance -he will need to check blood sugars

## 2020-11-22 NOTE — Patient Instructions (Signed)
Stop [place] due to costs.  I sent in a prescription for glipizide, and it can lower you blood sugar regardless of your blood glucose level. While changing your medicine, check you blood sugar in the AM and in the PM to make sure you aren't having hypoglycemic episodes.  Please have fasting labs drawn 2-3 days prior to your appointment so we can discuss the results during your office visit.

## 2020-11-22 NOTE — Progress Notes (Signed)
Acute Office Visit  Subjective:    Patient ID: Justin Klein, male    DOB: 1960-05-12, 60 y.o.   MRN: [id number]  Chief Complaint  Patient presents with   Diabetes   Insomnia    HPI Patient is in today for med check.  At his last OV he was started on keflex for ingrown toenail. Since then, he has seen podiatry.  He was started on trazodone at his last appointment. He didn't start taking the trazodone because it was an antidepressant.  Past Medical History:  Diagnosis Date   Asthma    Asthma 08/26/2011   Bilateral leg weakness 07/21/2012   Diabetes mellitus, type 2 ([place])    Essential hypertension 10/27/2019   Hypothyroidism    Ingrown nail of great toe of left foot 05/11/2019   Numbness and tingling of both legs 07/21/2012   Pernicious anemia    PSVT (paroxysmal supraventricular tachycardia) ([place]) 08/26/2011   Pulmonary embolus (HCC)    Diagnosed 9/12 at [facility]   Pulmonary embolus ([place]) 08/26/2011   Seborrheic dermatitis    Strain of muscle, fascia and tendon of lower back, initial encounter 01/24/2020   SVT (supraventricular tachycardia) ([place])    Wart on thumb 08/20/2019    Past Surgical History:  Procedure Laterality Date   Epidermal cyst resection     Left patella tendon repair      Family History  Problem Relation Age of Onset   Diabetes type II Mother    Cancer Father    Diabetes type II Sister     Social History   Socioeconomic History   Marital status: Divorced    Spouse name: Not on file   Number of children: 3   Years of education: Not on file   Highest education level: 12th grade  Occupational History   Not on file  Tobacco Use   Smoking status: Former    Types: Cigarettes   Smokeless tobacco: Never   Tobacco comments:    2012 quit  Substance and Sexual Activity   Alcohol use: No   Drug use: No   Sexual activity: Yes  Other Topics Concern   Not on file  Social History Narrative   Lives with fiance' Marcie Bal    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        Enjoys: music-jazz, reading-john grisham      Diet: no pork, salads daily, [place], uses sugar free sweeters, dark chocolate 70% or higher   Caffeine: decaff-sodas, ginger ale, tea-with truiva    Water: 3-4 cups       Wears seat belt   Smoke detectors at home   Does not use phone while driving    Social Determinants of Radio broadcast assistant Strain: Low Risk    Difficulty of Paying Living Expenses: Not very hard  Food Insecurity: No Food Insecurity   Worried About Charity fundraiser in the Last Year: Never true   Ran Out of Food in the Last Year: Never true  Transportation Needs: No Transportation Needs   Lack of Transportation (Medical): No   Lack of Transportation (Non-Medical): No  Physical Activity: Inactive   Days of Exercise per Week: 0 days   Minutes of Exercise per Session: 0 min  Stress: No Stress Concern Present   Feeling of Stress : Not at all  Social Connections: Moderately Isolated   Frequency of Communication with Friends and Family: More than three times a week   Frequency of Social Gatherings with Friends and Family:  Once a week   Attends Religious Services: More than 4 times per year   Active Member of Clubs or Organizations: No   Attends Archivist Meetings: Never   Marital Status: Divorced  Human resources officer Violence: Not At Risk   Fear of Current or Ex-Partner: No   Emotionally Abused: No   Physically Abused: No   Sexually Abused: No    Outpatient Medications Prior to Visit  Medication Sig Dispense Refill   albuterol (VENTOLIN HFA) 108 (90 Base) MCG/ACT inhaler INHALE1 OR 2 PUFFS INTO THE LUNGS EVERY SIX HOURS AS NEEDED FOR WHEEZING OR SHORTNESS OF BREATH. 8.5 g 0   Alcohol Swabs (B-D SINGLE USE SWABS REGULAR) PADS USE AS DIRECTED 100 each 0   cephALEXin (KEFLEX) 500 MG capsule Take 1 capsule (500 mg total) by mouth 2 (two) times daily. 20 capsule 0   diltiazem (CARDIZEM) 30 MG tablet Take 1 tablet (30 mg total) by mouth daily. 90 tablet 2    fluticasone (CUTIVATE) 0.05 % cream APPLY TO AFFECTED AREASCON FACE UP TO TWICE DAILYTAS NEEDED FOR RASH. 60 g 3   fluticasone furoate-vilanterol (BREO ELLIPTA) 100-25 MCG/INH AEPB Inhale 1 puff into the lungs daily. 90 each 2   gentamicin cream (GARAMYCIN) 0.1 % Apply 1 application topically 2 (two) times daily. 15 g 1   levothyroxine (SYNTHROID) 150 MCG tablet Take 1 tablet (150 mcg total) by mouth every morning. 90 tablet 1   rosuvastatin (CRESTOR) 10 MG tablet Take 1 tablet (10 mg total) by mouth daily. 90 tablet 1   sildenafil (VIAGRA) 100 MG tablet Take 1 tablet (100 mg total) by mouth as needed for erectile dysfunction. 20 tablet 2   traZODone (DESYREL) 50 MG tablet Take 0.5-1 tablets (25-50 mg total) by mouth at bedtime as needed for sleep. 30 tablet 3   TRUE METRIX BLOOD GLUCOSE TEST test strip TEST DAILY 100 strip 3   TRUEplus Lancets 33G MISC TEST DAILY 100 each 0   UNABLE TO FIND True Metrix meter, True Metrix test strips, lancets, control solution, and alcohol swabs. 1 Product 0   JANUVIA 100 MG tablet Take 1 tablet (100 mg total) by mouth daily. 90 tablet 1   No facility-administered medications prior to visit.    Allergies  Allergen Reactions   Shellfish Allergy Nausea And Vomiting    Seafood-upset stomach    Review of Systems  Constitutional: Negative.   Respiratory: Negative.    Cardiovascular: Negative.   Musculoskeletal: Negative.   Psychiatric/Behavioral: Negative.        Objective:    Physical Exam Constitutional:      Appearance: Normal appearance.  Cardiovascular:     Rate and Rhythm: Normal rate and regular rhythm.     Pulses: Normal pulses.     Heart sounds: Normal heart sounds.  Pulmonary:     Effort: Pulmonary effort is normal.     Breath sounds: Normal breath sounds.  Musculoskeletal:        General: Normal range of motion.  Neurological:     Mental Status: He is alert.  Psychiatric:        Mood and Affect: Mood normal.        Behavior:  Behavior normal.        Thought Content: Thought content normal.        Judgment: Judgment normal.    BP 138/81 (BP Location: Right Arm, Patient Position: Sitting, Cuff Size: Normal)   Pulse 60   Temp 98.2 F (36.8 C) (  Oral)   Ht '5\' 8"'  (1.727 m)   Wt 207 lb 1.3 oz (93.9 kg)   SpO2 97%   BMI 31.49 kg/m  Wt Readings from Last 3 Encounters:  11/22/20 207 lb 1.3 oz (93.9 kg)  09/19/20 209 lb (94.8 kg)  05/22/20 224 lb (101.6 kg)    Health Maintenance Due  Topic Date Due   OPHTHALMOLOGY EXAM  Never done   Zoster Vaccines- Shingrix (1 of 2) Never done   COVID-19 Vaccine (3 - Booster for Janssen series) 05/06/2020    There are no preventive care reminders to display for this patient.   Lab Results  Component Value Date   TSH 3.890 09/19/2020   Lab Results  Component Value Date   WBC 4.0 09/19/2020   HGB 13.2 09/19/2020   HCT 39.3 09/19/2020   MCV 85 09/19/2020   PLT 245 09/19/2020   Lab Results  Component Value Date   NA 138 09/19/2020   K 4.9 09/19/2020   CO2 23 09/19/2020   GLUCOSE 107 (H) 09/19/2020   BUN 15 09/19/2020   CREATININE 1.14 09/19/2020   BILITOT 0.3 09/19/2020   ALKPHOS 74 09/19/2020   AST 24 09/19/2020   ALT 12 09/19/2020   PROT 7.2 09/19/2020   ALBUMIN 4.4 09/19/2020   CALCIUM 10.2 09/19/2020   EGFR 74 09/19/2020   Lab Results  Component Value Date   CHOL 173 09/19/2020   Lab Results  Component Value Date   HDL 41 09/19/2020   Lab Results  Component Value Date   LDLCALC 120 (H) 09/19/2020   Lab Results  Component Value Date   TRIG 62 09/19/2020   Lab Results  Component Value Date   CHOLHDL 4.5 02/22/2020   Lab Results  Component Value Date   HGBA1C 6.2 (H) 09/19/2020       Assessment & Plan:   Problem List Items Addressed This Visit       Cardiovascular and Mediastinum   Essential hypertension    BP Readings from Last 3 Encounters:  11/22/20 138/81  09/19/20 129/79  05/22/20 134/80  -well controlled         Endocrine   Type 2 diabetes mellitus with diabetic neuropathy, without long-term current use of insulin ([place]) - Primary    Lab Results  Component Value Date   HGBA1C 6.2 (H) 09/19/2020  -doing well with [place], but he states it is too expensive; he is paying over $130/mo now that he is in the donut hole; is taking meds every other day d/t costs -referral to phramacy -goal is to get him on trulicity -he is out of januvia (100 mg daily) now -Rx. Glipizide -hopefully will stop the glipizide when he gets assistance -he will need to check blood sugars      Relevant Medications   glipiZIDE (GLUCOTROL XL) 5 MG 24 hr tablet   Other Relevant Orders   AMB Referral to [facility]   CBC with Differential/Platelet   CMP14+EGFR   Lipid Panel With LDL/HDL Ratio   Hemoglobin A1c     Other   Insomnia    -he didn't try trazodone d/t it being a psych med -encouraged him to try it since it isn't habit forming and is a low dose -he is in agreement        Meds ordered this encounter  Medications   glipiZIDE (GLUCOTROL XL) 5 MG 24 hr tablet    Sig: Take 1 tablet (5 mg total) by mouth daily with breakfast.  Dispense:  90 tablet    Refill:  [place], NP

## 2020-11-28 ENCOUNTER — Other Ambulatory Visit: Payer: Self-pay

## 2020-11-28 DIAGNOSIS — E039 Hypothyroidism, unspecified: Secondary | ICD-10-CM

## 2020-11-28 MED ORDER — LEVOTHYROXINE SODIUM 150 MCG PO TABS: 150.0000 ug | ORAL_TABLET | Freq: Every morning | ORAL | 1 refills | Status: DC

## 2020-12-12 ENCOUNTER — Ambulatory Visit (INDEPENDENT_AMBULATORY_CARE_PROVIDER_SITE_OTHER): Payer: Medicare HMO | Admitting: Pharmacist

## 2020-12-12 DIAGNOSIS — E785 Hyperlipidemia, unspecified: Secondary | ICD-10-CM

## 2020-12-12 DIAGNOSIS — E1140 Type 2 diabetes mellitus with diabetic neuropathy, unspecified: Secondary | ICD-10-CM

## 2020-12-12 DIAGNOSIS — I10 Essential (primary) hypertension: Secondary | ICD-10-CM

## 2020-12-12 MED ORDER — TRULICITY 0.75 MG/0.5ML ~~LOC~~ SOAJ: 0.7500 mg | SUBCUTANEOUS | 3 refills | Status: DC

## 2020-12-12 NOTE — Chronic Care Management (AMB) (Signed)
Chronic Care Management Pharmacy Note  12/12/2020 Name:  Justin Klein MRN:  [id number] DOB:  08/21/1960  Summary:  Type 2 Diabetes Continue glipizide 5 mg by mouth daily for now but will plan to discontinue and start Trulicity 3.73 mg subcutaneously once weekly. Patient assistance application completed today and given to front desk staff for patient to come by and complete financial verification inputs and sign application. Once completed, will fax to [facility]. Given patient current blood glucose, lowest dose will likely control blood glucose but can titrate dose to obtain more weight loss if desired by patient.   Hyperlipidemia Patient reports he has stopped taking rosuvastatin due to adverse effects Encourage dietary reduction of high fat containing foods such as butter, nuts, bacon, egg yolks, etc. Recommend regular aerobic exercise Discussed need for and importance of continued work on weight loss Reviewed risks of hyperlipidemia, principles of treatment and consequences of untreated hyperlipidemia Re-check lipid panel in 4-12 weeks Consider trial of alternative statin such as pravastatin which is generally well tolerated. Could also consider ezetimibe 10 mg by mouth daily if unable to tolerate statin after trial of at least 2 different statins or if LDL remains above goal on maximally tolerated statin.  Subjective: Justin Klein is an 60 y.o. year old male who is a primary patient of Noreene Larsson, NP.  The CCM team was consulted for assistance with disease management and care coordination needs.    Engaged with patient by telephone for initial visit in response to provider referral for pharmacy case management and/or care coordination services.   Consent to Services:  The patient was given the following information about Chronic Care Management services today, agreed to services, and gave verbal consent: 1. CCM service includes personalized support from designated  clinical staff supervised by the primary care provider, including individualized plan of care and coordination with other care providers 2. 24/7 contact phone numbers for assistance for urgent and routine care needs. 3. Service will only be billed when office clinical staff spend 20 minutes or more in a month to coordinate care. 4. Only one practitioner may furnish and bill the service in a calendar month. 5.The patient may stop CCM services at any time (effective at the end of the month) by phone call to the office staff. 6. The patient will be responsible for cost sharing (co-pay) of up to 20% of the service fee (after annual deductible is met). Patient agreed to services and consent obtained.  Patient Care Team: Noreene Larsson, NP as PCP - General (Nurse Practitioner) Beryle Lathe, [facility] (Pharmacist)  Objective:  Lab Results  Component Value Date   CREATININE 1.14 09/19/2020   CREATININE 1.12 05/24/2020   CREATININE 1.15 02/22/2020    Lab Results  Component Value Date   HGBA1C 6.2 (H) 09/19/2020   Last diabetic Eye exam: No results found for: HMDIABEYEEXA  Last diabetic Foot exam: No results found for: HMDIABFOOTEX      Component Value Date/Time   CHOL 173 09/19/2020 0835   TRIG 62 09/19/2020 0835   HDL 41 09/19/2020 0835   CHOLHDL 4.5 02/22/2020 0904   LDLCALC 120 (H) 09/19/2020 0835    Hepatic Function Latest Ref Rng & Units 09/19/2020 05/24/2020 02/22/2020  Total Protein 6.0 - 8.5 g/dL 7.2 7.6 7.7  Albumin 3.8 - 4.9 g/dL 4.4 4.4 4.5  AST 0 - 40 IU/L '24 29 24  ' ALT 0 - 44 IU/L '12 27 20  ' Alk Phosphatase 44 -  121 IU/L 74 84 88  Total Bilirubin 0.0 - 1.2 mg/dL 0.3 0.4 0.4    Lab Results  Component Value Date/Time   TSH 3.890 09/19/2020 08:35 AM   TSH 3.190 05/24/2020 01:04 PM   FREET4 1.17 09/19/2020 08:35 AM   FREET4 1.16 05/24/2020 01:04 PM    CBC Latest Ref Rng & Units 09/19/2020 05/24/2020 02/22/2020  WBC 3.4 - 10.8 x10E3/uL 4.0 5.5 5.1  Hemoglobin 13.0 -  17.7 g/dL 13.2 13.5 14.2  Hematocrit 37.5 - 51.0 % 39.3 41.7 43.2  Platelets 150 - 450 x10E3/uL 245 286 247    No results found for: VD25OH  Clinical ASCVD: No  The 10-year ASCVD risk score (Arnett DK, et al., 2019) is: 27.6%   Values used to calculate the score:     Age: 18 years     Sex: Male     Is Non-Hispanic African American: Yes     Diabetic: Yes     Tobacco smoker: No     Systolic Blood Pressure: 594 mmHg     Is BP treated: Yes     HDL Cholesterol: 41 mg/dL     Total Cholesterol: 173 mg/dL    Social History   Tobacco Use  Smoking Status Former   Types: Cigarettes  Smokeless Tobacco Never  Tobacco Comments   2012 quit   BP Readings from Last 3 Encounters:  11/22/20 138/81  09/19/20 129/79  05/22/20 134/80   Pulse Readings from Last 3 Encounters:  11/22/20 60  09/19/20 61  05/22/20 63   Wt Readings from Last 3 Encounters:  11/22/20 207 lb 1.3 oz (93.9 kg)  09/19/20 209 lb (94.8 kg)  05/22/20 224 lb (101.6 kg)    Assessment: Review of patient past medical history, allergies, medications, health status, including review of consultants reports, laboratory and other test data, was performed as part of comprehensive evaluation and provision of chronic care management services.   SDOH:  (Social Determinants of Health) assessments and interventions performed:    CCM Care Plan  Allergies  Allergen Reactions   Metformin And Related Other (See Comments)    Low B12, gait disturbance, myopathy, neuropathy, pernicious anemia    Crestor [Rosuvastatin] Other (See Comments)    "Made me feel sluggish and drained"   Shellfish Allergy Nausea And Vomiting    Seafood-upset stomach    Medications Reviewed Today     Reviewed by Beryle Lathe, [facility] (Pharmacist) on 12/12/20 at [place] List Status: <None>   Medication Order Taking? Sig Documenting Provider Last Dose Status Informant  albuterol (VENTOLIN HFA) 108 (90 Base) MCG/ACT inhaler [id number] Yes INHALE1  OR 2 PUFFS INTO THE LUNGS EVERY SIX HOURS AS NEEDED FOR WHEEZING OR SHORTNESS OF BREATH. Perlie Mayo, NP Taking Active            Med Note Beryle Lathe   Tue Dec 12, 2020  3:43 PM) Has not needed to use in a while  Alcohol Swabs (B-D SINGLE USE SWABS REGULAR) PADS [id number]  USE AS DIRECTED Noreene Larsson, NP  Active   Cholecalciferol (VITAMIN D-3) 125 MCG (5000 UT) TABS [id number] Yes Take 5,000 Units by mouth daily. [provider] Taking Active   Cyanocobalamin (B-12) 1000 MCG CAPS [id number] Yes Take 1,000 mcg by mouth daily. [provider] Taking Active   diltiazem (CARDIZEM) 30 MG tablet [id number] Yes Take 1 tablet (30 mg total) by mouth daily. Perlie Mayo, NP Taking Active  Med Note Beryle Lathe   Tue Dec 12, 2020  3:45 PM) As needed for SVT ~2x/wk  fluticasone (CUTIVATE) 0.05 % cream [id number] Yes APPLY TO AFFECTED AREASCON FACE UP TO TWICE DAILYTAS NEEDED FOR RASH. Perlie Mayo, NP Taking Active   fluticasone furoate-vilanterol (BREO ELLIPTA) 100-25 MCG/INH AEPB [id number] Yes Inhale 1 puff into the lungs daily. Perlie Mayo, NP Taking Active            Med Note Currie Paris Dec 12, 2020  3:44 PM) Only using as needed for asthma. Does not need to use often  gentamicin cream (GARAMYCIN) 0.1 % [id number] Yes Apply 1 application topically 2 (two) times daily. Edrick Kins, DPM Taking Active            Med Note Beryle Lathe   Tue Dec 12, 2020  3:48 PM) Uses on toe  glipiZIDE (GLUCOTROL XL) 5 MG 24 hr tablet [id number] Yes Take 1 tablet (5 mg total) by mouth daily with breakfast. Noreene Larsson, NP Taking Active   levothyroxine (SYNTHROID) 150 MCG tablet [id number] Yes Take 1 tablet (150 mcg total) by mouth every morning. Noreene Larsson, NP Taking Active   Omega-3 Fatty Acids (FISH OIL) 1000 MG CPDR [id number] Yes Take 1 capsule by mouth daily. [provider] Taking Active   rosuvastatin (CRESTOR)  10 MG tablet [id number] No Take 1 tablet (10 mg total) by mouth daily.  Patient not taking: Reported on 12/12/2020   Noreene Larsson, NP Not Taking Active   sildenafil (VIAGRA) 100 MG tablet [id number] Yes Take 1 tablet (100 mg total) by mouth as needed for erectile dysfunction. Perlie Mayo, NP Taking Active   TRUE METRIX BLOOD GLUCOSE TEST test strip [id number]  TEST DAILY Perlie Mayo, NP  Active   TRUEplus Lancets 33G MISC [id number]  TEST DAILY Noreene Larsson, NP  Active   UNABLE TO FIND [id number]  True Metrix meter, True Metrix test strips, lancets, control solution, and alcohol swabs. Perlie Mayo, NP  Active             Patient Active Problem List   Diagnosis Date Noted   Insomnia 09/19/2020   Falls 05/22/2020   Annual visit for general adult medical examination with abnormal findings 02/23/2020   Elevated lipoprotein(a) 02/23/2020   Essential hypertension 10/27/2019   History of shingles 10/27/2019   Erectile dysfunction 08/20/2019   Type 2 diabetes mellitus with diabetic neuropathy, without long-term current use of insulin ([place]) 05/11/2019   Obesity (BMI 30.0-34.9) 05/11/2019   Hypothyroidism 05/11/2019   Ingrown left big toenail 05/11/2019   Asthma 08/26/2011    Immunization History  Administered Date(s) Administered   Janssen (J&J) SARS-COV-2 Vaccination 06/01/2019   Moderna Sars-Covid-2 Vaccination 01/07/2020    Conditions to be addressed/monitored: HTN, HLD, and DMII  Care Plan : Medication Management  Updates made by Beryle Lathe, [place] since 12/12/2020 12:00 AM     Problem: T2DM, HLD, HTN   Priority: High  Onset Date: 12/12/2020     Long-Range Goal: Disease Progression Prevention   Start Date: 12/12/2020  Expected End Date: 03/12/2021  This Visit's Progress: On track  Priority: High  Note:   Current Barriers:  Unable to independently afford treatment regimen Unable to achieve control of hyperlipidemia Suboptimal therapeutic regimen  for diabetes and hyperlipidemia  Pharmacist Clinical Goal(s):  Patient will verbalize ability to afford treatment regimen achieve control of hyperlipidemia as evidenced by improved  LDL adhere to plan to optimize therapeutic regimen for diabetes and hyperlipidemia as evidenced by report of adherence to recommended medication management changes through collaboration with PharmD and provider.   Interventions: 1:1 collaboration with Noreene Larsson, NP regarding development and update of comprehensive plan of care as evidenced by provider attestation and co-signature Inter-disciplinary care team collaboration (see longitudinal plan of care) Comprehensive medication review performed; medication list updated in electronic medical record  Type 2 Diabetes - New goal.: Controlled; Most recent A1c at goal of <7% per ADA guidelines Current medications: glipizide XL 5 mg by mouth once daily Intolerances:  metformin Taking medications as directed: yes Side effects thought to be attributed to current medication regimen: no Hypoglycemia prevention: not indicated at this time Current meal patterns:  patient reports reducing carbohydrate and fat intake Current exercise: yard work and has cardio equipment at his home On a statin: no, not tolerating rosuvastatin so he stopped taking. On aspirin 81 mg daily: no Last microalbumin: 10; on an ACEi/ARB: no Last eye exam: overdue Last foot exam: completed within last year Pneumonia vaccine: unknown Shingles: unknown Influenza vaccine:  needs this fall Current glucose readings:  not discussed today Provided extensive education on medication therapy options Encouraged regular aerobic exercise with a goal of 30 minutes five times per week (150 minutes per week) Patient identified as a good candidate for a GLP-1 receptor agonist given reduction in cardiovascular disease, slowed chronic kidney disease progression, low risk of hypoglycemia, increased satiety , and  weight loss. Patient denies a personal or family history of medullary thyroid carcinoma (MTC) or Multiple Endocrine Neoplasia syndrome type 2 (MEN 2). Patient also denies any history of pancreatitis or biliary disease. Continue glipizide 5 mg by mouth daily for now but will plan to discontinue and start Trulicity 7.00 mg subcutaneously once weekly. Patient assistance application completed today and given to front desk staff for patient to come by and complete financial verification inputs and sign application. Once completed, will fax to [facility]. Given patient current blood glucose, lowest dose will likely control blood glucose but can titrate dose to obtain more weight loss if desired by patient.   Hypertension - New goal.: Blood pressure under good control. Blood pressure is at goal of <130/80 mmHg per 2017 AHA/ACC guidelines. Current medications: diltiazem 30 mg by mouth as needed for SVT Current home blood pressure: not discussed today but will discuss at future visits Encourage dietary sodium restriction/DASH diet Recommend regular aerobic exercise Recommend home blood pressure monitoring to discuss at next visit Discussed need for and importance of continued work on weight loss Continue current management as above Discussed vagal maneuvers to abort SVT as well  Hyperlipidemia - New goal.: Uncontrolled. LDL above goal of <70 due to very high risk given diabetes + at least 1 additional major risk factor (hypertension) per 2020 AACE/ACE guidelines. Triglycerides at goal of <150 per 2020 AACE/ACE guidelines. Current medications:  none Intolerances:  rosuvastatin (sluggish and drained energy) Taking medications as directed: no, patient reports he has stopped taking rosuvastatin due to adverse effects Encourage dietary reduction of high fat containing foods such as butter, nuts, bacon, egg yolks, etc. Recommend regular aerobic exercise Discussed need for and importance of continued work on  weight loss Reviewed risks of hyperlipidemia, principles of treatment and consequences of untreated hyperlipidemia Re-check lipid panel in 4-12 weeks Consider trial of alternative statin such as pravastatin which is generally well tolerated. Could also consider ezetimibe 10 mg by mouth daily if unable  to tolerate statin after trial of at least 2 different statins or if LDL remains above goal on maximally tolerated statin.  Patient Goals/Self-Care Activities Patient will:  Take medications as prescribed Collaborate with provider on medication access solutions Target a minimum of 150 minutes of moderate intensity exercise weekly Engage in dietary modifications by less frequent dining out, decreased fat intake, and fewer sweetened foods & beverages  Follow Up Plan: Telephone follow up appointment with care management team member scheduled for: 01/16/21      Medication Assistance:  [employer] application for Trulicity pending  Patient's preferred pharmacy is:  [facility], [address] [place] [address] Phone: [phone] Fax: [phone]  [facility] - [place], [place] [address] Phone: [phone] Fax: [phone]  Follow Up:  Patient agrees to Care Plan and Follow-up.  Plan: Telephone follow up appointment with care management team member scheduled for:  01/16/21  Kennon Holter, PharmD Clinical Pharmacist [facility] [phone]

## 2020-12-12 NOTE — Patient Instructions (Signed)
Justin Klein,  It was great to talk to you today!  Please call me with any questions or concerns.   Visit Information   PATIENT GOALS:   Goals Addressed             This Visit's Progress    Medication Management       Patient Goals/Self-Care Activities Patient will:  Take medications as prescribed Collaborate with provider on medication access solutions Target a minimum of 150 minutes of moderate intensity exercise weekly Engage in dietary modifications by less frequent dining out, decreased fat intake, and fewer sweetened foods & beverages        Consent to CCM Services: Justin Klein was given information about Chronic Care Management services including:  CCM service includes personalized support from designated clinical staff supervised by his physician, including individualized plan of care and coordination with other care providers 24/7 contact phone numbers for assistance for urgent and routine care needs. Service will only be billed when office clinical staff spend 20 minutes or more in a month to coordinate care. Only one practitioner may furnish and bill the service in a calendar month. The patient may stop CCM services at any time (effective at the end of the month) by phone call to the office staff. The patient will be responsible for cost sharing (co-pay) of up to 20% of the service fee (after annual deductible is met).  Patient agreed to services and verbal consent obtained.   Patient verbalizes understanding of instructions provided today and agrees to view in [place].   Telephone follow up appointment with care management team member scheduled for:01/16/21  Justin Klein, PharmD Clinical Pharmacist [facility] [phone]  CLINICAL CARE PLAN: Patient Care Plan: Medication Management     Problem Identified: T2DM, HLD, HTN   Priority: High  Onset Date: 12/12/2020     Long-Range Goal: Disease Progression Prevention   Start  Date: 12/12/2020  Expected End Date: 03/12/2021  This Visit's Progress: On track  Priority: High  Note:   Current Barriers:  Unable to independently afford treatment regimen Unable to achieve control of hyperlipidemia Suboptimal therapeutic regimen for diabetes and hyperlipidemia  Pharmacist Clinical Goal(s):  Patient will verbalize ability to afford treatment regimen achieve control of hyperlipidemia as evidenced by improved LDL adhere to plan to optimize therapeutic regimen for diabetes and hyperlipidemia as evidenced by report of adherence to recommended medication management changes through collaboration with PharmD and provider.   Interventions: 1:1 collaboration with Justin Larsson, NP regarding development and update of comprehensive plan of care as evidenced by provider attestation and co-signature Inter-disciplinary care team collaboration (see longitudinal plan of care) Comprehensive medication review performed; medication list updated in electronic medical record  Type 2 Diabetes - New goal.: Controlled; Most recent A1c at goal of <7% per ADA guidelines Current medications: glipizide XL 5 mg by mouth once daily Intolerances:  metformin Taking medications as directed: yes Side effects thought to be attributed to current medication regimen: no Hypoglycemia prevention: not indicated at this time Current meal patterns:  patient reports reducing carbohydrate and fat intake Current exercise: yard work and has cardio equipment at his home On a statin: no, not tolerating rosuvastatin so he stopped taking. On aspirin 81 mg daily: no Last microalbumin: 10; on an ACEi/ARB: no Last eye exam: overdue Last foot exam: completed within last year Pneumonia vaccine: unknown Shingles: unknown Influenza vaccine:  needs this fall Current glucose readings:  not discussed today Provided extensive education on medication  therapy options Encouraged regular aerobic exercise with a goal of 30  minutes five times per week (150 minutes per week) Patient identified as a good candidate for a GLP-1 receptor agonist given reduction in cardiovascular disease, slowed chronic kidney disease progression, low risk of hypoglycemia, increased satiety , and weight loss. Patient denies a personal or family history of medullary thyroid carcinoma (MTC) or Multiple Endocrine Neoplasia syndrome type 2 (MEN 2). Patient also denies any history of pancreatitis or biliary disease. Continue glipizide 5 mg by mouth daily for now but will plan to discontinue and start Trulicity 7.50 mg subcutaneously once weekly. Patient assistance application completed today and given to front desk staff for patient to come by and complete financial verification inputs and sign application. Once completed, will fax to [facility]. Given patient current blood glucose, lowest dose will likely control blood glucose but can titrate dose to obtain more weight loss if desired by patient.   Hypertension - New goal.: Blood pressure under good control. Blood pressure is at goal of <130/80 mmHg per 2017 AHA/ACC guidelines. Current medications: diltiazem 30 mg by mouth as needed for SVT Current home blood pressure: not discussed today but will discuss at future visits Encourage dietary sodium restriction/DASH diet Recommend regular aerobic exercise Recommend home blood pressure monitoring to discuss at next visit Discussed need for and importance of continued work on weight loss Continue current management as above Discussed vagal maneuvers to abort SVT as well  Hyperlipidemia - New goal.: Uncontrolled. LDL above goal of <70 due to very high risk given diabetes + at least 1 additional major risk factor (hypertension) per 2020 AACE/ACE guidelines. Triglycerides at goal of <150 per 2020 AACE/ACE guidelines. Current medications:  none Intolerances:  rosuvastatin (sluggish and drained energy) Taking medications as directed: no, patient reports  he has stopped taking rosuvastatin due to adverse effects Encourage dietary reduction of high fat containing foods such as butter, nuts, bacon, egg yolks, etc. Recommend regular aerobic exercise Discussed need for and importance of continued work on weight loss Reviewed risks of hyperlipidemia, principles of treatment and consequences of untreated hyperlipidemia Re-check lipid panel in 4-12 weeks Consider trial of alternative statin such as pravastatin which is generally well tolerated. Could also consider ezetimibe 10 mg by mouth daily if unable to tolerate statin after trial of at least 2 different statins or if LDL remains above goal on maximally tolerated statin.  Patient Goals/Self-Care Activities Patient will:  Take medications as prescribed Collaborate with provider on medication access solutions Target a minimum of 150 minutes of moderate intensity exercise weekly Engage in dietary modifications by less frequent dining out, decreased fat intake, and fewer sweetened foods & beverages  Follow Up Plan: Telephone follow up appointment with care management team member scheduled for: 01/16/21

## 2020-12-14 ENCOUNTER — Ambulatory Visit: Payer: Medicare HMO | Admitting: Pharmacist

## 2020-12-14 DIAGNOSIS — I10 Essential (primary) hypertension: Secondary | ICD-10-CM

## 2020-12-14 DIAGNOSIS — E1140 Type 2 diabetes mellitus with diabetic neuropathy, unspecified: Secondary | ICD-10-CM

## 2020-12-14 DIAGNOSIS — E785 Hyperlipidemia, unspecified: Secondary | ICD-10-CM

## 2020-12-14 NOTE — Chronic Care Management (AMB) (Signed)
Chronic Care Management Pharmacy Note  12/14/2020 Name:  Justin Klein MRN:  [id number] DOB:  [birth year]  Summary:  Type 2 Diabetes Last eye exam: overdue, plans to get completed next week Received notification today from [facility] that patient has been approved for Trulicity through 16/94/50. Patient was called and instructed to initiate therapy when he receives and to discontinue glipizide 5 mg by mouth daily upon initiation of Trulicity. Will start Trulicity at 3.88 mg subcutaneously once weekly. Can titrate dose to obtain more weight loss if desired by patient.   Hyperlipidemia Prior intolerances with rosuvastatin (sluggish and drained energy). Patient has stopped taking.  Discussed importance of statin use in diabetes for primary prevention. Discussed trial of alternative statin such as pravastatin but patient prefers to retry rosuvastatin which he still has supply of at home. Will follow-up regarding tolerability in 4 weeks. If LDL remains above goal on maximally tolerated statin, may consider addition of ezetimibe 10 mg by mouth daily  Other Patient requested medication assistance assessment for Breo inhaler. This was completed today. Unfortunately, patient's household income is above the requirements for asthma inhalers from [place], [place], and [place]. Patient instructed to inform me if household income decreases and another assessment can be made at that time to see if he qualifies.   Subjective: Justin Klein is an 60 y.o. year old male who is a primary patient of Noreene Larsson, NP.  The CCM team was consulted for assistance with disease management and care coordination needs.    Engaged with patient by telephone for follow up visit in response to provider referral for pharmacy case management and/or care coordination services.   Consent to Services:  The patient was given information about Chronic Care Management services, agreed to services, and gave verbal consent prior to  initiation of services.  Please see initial visit note for detailed documentation.   Patient Care Team: Noreene Larsson, NP as PCP - General (Nurse Practitioner) Beryle Lathe, [facility] (Pharmacist)  Objective:  Lab Results  Component Value Date   CREATININE 1.14 09/19/2020   CREATININE 1.12 05/24/2020   CREATININE 1.15 02/22/2020    Lab Results  Component Value Date   HGBA1C 6.2 (H) 09/19/2020   Last diabetic Eye exam: No results found for: HMDIABEYEEXA  Last diabetic Foot exam: No results found for: HMDIABFOOTEX      Component Value Date/Time   CHOL 173 09/19/2020 0835   TRIG 62 09/19/2020 0835   HDL 41 09/19/2020 0835   CHOLHDL 4.5 02/22/2020 0904   LDLCALC 120 (H) 09/19/2020 0835    Hepatic Function Latest Ref Rng & Units 09/19/2020 05/24/2020 02/22/2020  Total Protein 6.0 - 8.5 g/dL 7.2 7.6 7.7  Albumin 3.8 - 4.9 g/dL 4.4 4.4 4.5  AST 0 - 40 IU/L '24 29 24  ' ALT 0 - 44 IU/L '12 27 20  ' Alk Phosphatase 44 - 121 IU/L 74 84 88  Total Bilirubin 0.0 - 1.2 mg/dL 0.3 0.4 0.4    Lab Results  Component Value Date/Time   TSH 3.890 09/19/2020 08:35 AM   TSH 3.190 05/24/2020 01:04 PM   FREET4 1.17 09/19/2020 08:35 AM   FREET4 1.16 05/24/2020 01:04 PM    CBC Latest Ref Rng & Units 09/19/2020 05/24/2020 02/22/2020  WBC 3.4 - 10.8 x10E3/uL 4.0 5.5 5.1  Hemoglobin 13.0 - 17.7 g/dL 13.2 13.5 14.2  Hematocrit 37.5 - 51.0 % 39.3 41.7 43.2  Platelets 150 - 450 x10E3/uL 245 286 247  No results found for: VD25OH  Clinical ASCVD: No  The 10-year ASCVD risk score (Arnett DK, et al., 2019) is: 27.6%   Values used to calculate the score:     Age: 75 years     Sex: Male     Is Non-Hispanic African American: Yes     Diabetic: Yes     Tobacco smoker: No     Systolic Blood Pressure: 503 mmHg     Is BP treated: Yes     HDL Cholesterol: 41 mg/dL     Total Cholesterol: 173 mg/dL    Social History   Tobacco Use  Smoking Status Former   Types: Cigarettes  Smokeless Tobacco  Never  Tobacco Comments   2012 quit   BP Readings from Last 3 Encounters:  11/22/20 138/81  09/19/20 129/79  05/22/20 134/80   Pulse Readings from Last 3 Encounters:  11/22/20 60  09/19/20 61  05/22/20 63   Wt Readings from Last 3 Encounters:  11/22/20 207 lb 1.3 oz (93.9 kg)  09/19/20 209 lb (94.8 kg)  05/22/20 224 lb (101.6 kg)    Assessment: Review of patient past medical history, allergies, medications, health status, including review of consultants reports, laboratory and other test data, was performed as part of comprehensive evaluation and provision of chronic care management services.   SDOH:  (Social Determinants of Health) assessments and interventions performed:    CCM Care Plan  Allergies  Allergen Reactions   Metformin And Related Other (See Comments)    Low B12, gait disturbance, myopathy, neuropathy, pernicious anemia    Crestor [Rosuvastatin] Other (See Comments)    "Made me feel sluggish and drained" Planning to retry on 12/14/20   Shellfish Allergy Nausea And Vomiting    Seafood-upset stomach    Medications Reviewed Today     Reviewed by Beryle Lathe, [facility] (Pharmacist) on 12/14/20 at [employer] List Status: <None>   Medication Order Taking? Sig Documenting Provider Last Dose Status Informant  albuterol (VENTOLIN HFA) 108 (90 Base) MCG/ACT inhaler [id number] Yes INHALE1 OR 2 PUFFS INTO THE LUNGS EVERY SIX HOURS AS NEEDED FOR WHEEZING OR SHORTNESS OF BREATH. Perlie Mayo, NP Taking Active            Med Note Beryle Lathe   Tue Dec 12, 2020  3:43 PM) Has not needed to use in a while  Alcohol Swabs (B-D SINGLE USE SWABS REGULAR) PADS [id number]  USE AS DIRECTED Noreene Larsson, NP  Active   Cholecalciferol (VITAMIN D-3) 125 MCG (5000 UT) TABS [id number] Yes Take 5,000 Units by mouth daily. [provider] Taking Active   Cyanocobalamin (B-12) 1000 MCG CAPS [id number] Yes Take 1,000 mcg by mouth daily. [provider]  Taking Active   diltiazem (CARDIZEM) 30 MG tablet [id number] Yes Take 1 tablet (30 mg total) by mouth daily. Perlie Mayo, NP Taking Active            Med Note ([place], [place] Dec 12, 2020  3:45 PM) As needed for SVT ~2x/wk  Dulaglutide (TRULICITY) 1.00 FH/2.1FX SOPN [id number] No Inject 0.75 mg into the skin once a week for 48 doses.  Patient not taking: Reported on 12/14/2020   Lindell Spar, MD Not Taking Active            Med Note Jim Like Dec 14, 2020  3:07 PM) Approved for patient assistance through [facility] through 02/24/21  fluticasone (CUTIVATE) 0.05 %  cream [id number] Yes APPLY TO AFFECTED AREASCON FACE UP TO TWICE DAILYTAS NEEDED FOR RASH. Perlie Mayo, NP Taking Active   fluticasone furoate-vilanterol (BREO ELLIPTA) 100-25 MCG/INH AEPB [id number] Yes Inhale 1 puff into the lungs daily. Perlie Mayo, NP Taking Active            Med Note Currie Paris Dec 12, 2020  3:44 PM) Only using as needed for asthma. Does not need to use often  gentamicin cream (GARAMYCIN) 0.1 % [id number] Yes Apply 1 application topically 2 (two) times daily. Edrick Kins, DPM Taking Active            Med Note Beryle Lathe   Tue Dec 12, 2020  3:48 PM) Uses on toe  glipiZIDE (GLUCOTROL XL) 5 MG 24 hr tablet [id number] Yes Take 1 tablet (5 mg total) by mouth daily with breakfast. Noreene Larsson, NP Taking Active   levothyroxine (SYNTHROID) 150 MCG tablet [id number] Yes Take 1 tablet (150 mcg total) by mouth every morning. Noreene Larsson, NP Taking Active   Omega-3 Fatty Acids (FISH OIL) 1000 MG CPDR [id number] Yes Take 1 capsule by mouth daily. [provider] Taking Active   rosuvastatin (CRESTOR) 10 MG tablet [id number] No Take 1 tablet (10 mg total) by mouth daily.  Patient not taking: No sig reported   Noreene Larsson, NP Not Taking Active   sildenafil (VIAGRA) 100 MG tablet [id number] Yes Take 1 tablet (100 mg total) by mouth as  needed for erectile dysfunction. Perlie Mayo, NP Taking Active   TRUE METRIX BLOOD GLUCOSE TEST test strip [id number]  TEST DAILY Perlie Mayo, NP  Active   TRUEplus Lancets 33G MISC [id number]  TEST DAILY Noreene Larsson, NP  Active   UNABLE TO FIND [id number]  True Metrix meter, True Metrix test strips, lancets, control solution, and alcohol swabs. Perlie Mayo, NP  Active             Patient Active Problem List   Diagnosis Date Noted   Insomnia 09/19/2020   Falls 05/22/2020   Annual visit for general adult medical examination with abnormal findings 02/23/2020   Elevated lipoprotein(a) 02/23/2020   Essential hypertension 10/27/2019   History of shingles 10/27/2019   Erectile dysfunction 08/20/2019   Type 2 diabetes mellitus with diabetic neuropathy, without long-term current use of insulin ([place]) 05/11/2019   Obesity (BMI 30.0-34.9) 05/11/2019   Hypothyroidism 05/11/2019   Ingrown left big toenail 05/11/2019   Asthma 08/26/2011    Immunization History  Administered Date(s) Administered   Janssen (J&J) SARS-COV-2 Vaccination 06/01/2019   Moderna Sars-Covid-2 Vaccination 01/07/2020    Conditions to be addressed/monitored: HTN, HLD, and DMII  Care Plan : Medication Management  Updates made by Beryle Lathe, [place] since 12/14/2020 12:00 AM     Problem: T2DM, HLD, HTN   Priority: High  Onset Date: 12/12/2020     Long-Range Goal: Disease Progression Prevention   Start Date: 12/12/2020  Expected End Date: 03/12/2021  Recent Progress: On track  Priority: High  Note:   Current Barriers:  Unable to independently afford treatment regimen Unable to achieve control of hyperlipidemia Suboptimal therapeutic regimen for hyperlipidemia  Pharmacist Clinical Goal(s):  Patient will verbalize ability to afford treatment regimen achieve control of hyperlipidemia as evidenced by improved LDL adhere to plan to optimize therapeutic regimen for diabetes and  hyperlipidemia as evidenced by report of adherence to recommended medication management changes through collaboration with PharmD  and provider.   Interventions: 1:1 collaboration with Noreene Larsson, NP regarding development and update of comprehensive plan of care as evidenced by provider attestation and co-signature Inter-disciplinary care team collaboration (see longitudinal plan of care) Comprehensive medication review performed; medication list updated in electronic medical record  Type 2 Diabetes - Goal on track: YES.: Controlled; Most recent A1c at goal of <7% per ADA guidelines Current medications: glipizide XL 5 mg by mouth once daily Intolerances:  metformin Taking medications as directed: yes Side effects thought to be attributed to current medication regimen: no Hypoglycemia prevention: not indicated at this time Current meal patterns:  patient reports reducing carbohydrate and fat intake Current exercise: yard work and has cardio equipment at his home On a statin: no, not tolerating rosuvastatin so he stopped taking. On aspirin 81 mg daily: no Last microalbumin: 10; on an ACEi/ARB: no Last eye exam: overdue, plans to get completed next week Last foot exam: completed within last year Pneumonia vaccine: unknown Shingles: unknown Influenza vaccine:  needs this fall Current glucose readings:  not discussed today Provided extensive education on medication therapy options Encouraged regular aerobic exercise with a goal of 30 minutes five times per week (150 minutes per week) Patient identified as a good candidate for a GLP-1 receptor agonist given reduction in cardiovascular disease, slowed chronic kidney disease progression, low risk of hypoglycemia, increased satiety , and weight loss. Patient denies a personal or family history of medullary thyroid carcinoma (MTC) or Multiple Endocrine Neoplasia syndrome type 2 (MEN 2). Patient also denies any history of pancreatitis or biliary  disease. Received notification today from [facility] that patient has been approved for Trulicity through 22/63/33. Patient was called and instructed to initiate therapy when he receives and to discontinue glipizide 5 mg by mouth daily. Will start Trulicity at 5.45 mg subcutaneously once weekly. Can titrate dose to obtain more weight loss if desired by patient.   Hypertension - Condition stable. Not addressed this visit.: Blood pressure under good control. Blood pressure is at goal of <130/80 mmHg per 2017 AHA/ACC guidelines. Current medications: diltiazem 30 mg by mouth as needed for SVT Current home blood pressure: not discussed today but will discuss at future visits Encourage dietary sodium restriction/DASH diet Recommend regular aerobic exercise Recommend home blood pressure monitoring to discuss at next visit Discussed need for and importance of continued work on weight loss Continue current management as above Discussed vagal maneuvers to abort SVT as well  Hyperlipidemia - Goal on track: YES.: Uncontrolled. LDL above goal of <70 due to very high risk given diabetes + at least 1 additional major risk factor (hypertension) per 2020 AACE/ACE guidelines. Triglycerides at goal of <150 per 2020 AACE/ACE guidelines. Current medications:  none Intolerances:  rosuvastatin (sluggish and drained energy) Taking medications as directed: no, patient reports he has stopped taking rosuvastatin due to adverse effects Encourage dietary reduction of high fat containing foods such as butter, nuts, bacon, egg yolks, etc. Recommend regular aerobic exercise Discussed need for and importance of continued work on weight loss Reviewed risks of hyperlipidemia, principles of treatment and consequences of untreated hyperlipidemia Re-check lipid panel in 4-12 weeks Discussed importance of statin use in diabetes for primary prevention. Discussed trial of alternative statin such as pravastatin but patient prefers to  retry rosuvastatin which he still has supply of at home. Will follow-up regarding tolerability in 4 weeks. If LDL remains above goal on maximally tolerated statin, may consider addition of ezetimibe 10 mg by mouth daily  Patient Goals/Self-Care Activities Patient will:  Take medications as prescribed Collaborate with provider on medication access solutions Target a minimum of 150 minutes of moderate intensity exercise weekly Engage in dietary modifications by less frequent dining out, decreased fat intake, and fewer sweetened foods & beverages  Follow Up Plan: Telephone follow up appointment with care management team member scheduled for: 01/16/21      Medication Assistance:  [place] for Trulicity through 88/41/66  Patient's preferred pharmacy is:  [facility], [address] [place] [address] Phone: [phone] Fax: [phone]  [facility] - [place], [address] [address] Phone: [phone] Fax: [phone]  Follow Up:  Patient agrees to Care Plan and Follow-up.  Plan: Telephone follow up appointment with care management team member scheduled for:  01/16/21  Kennon Holter, PharmD Clinical Pharmacist [facility] [phone]

## 2020-12-14 NOTE — Patient Instructions (Signed)
Justin Klein,  It was great to talk to you today!  Please call me with any questions or concerns.   Visit Information  PATIENT GOALS:  Goals Addressed             This Visit's Progress    Medication Management       Patient Goals/Self-Care Activities Patient will:  Take medications as prescribed Collaborate with provider on medication access solutions Target a minimum of 150 minutes of moderate intensity exercise weekly Engage in dietary modifications by less frequent dining out, decreased fat intake, and fewer sweetened foods & beverages          Patient verbalizes understanding of instructions provided today and agrees to view in MyChart.   Telephone follow up appointment with care management team member scheduled for:01/16/21  Domenic Moras, PharmD Clinical Pharmacist [facility] [phone]  Trulicity (dulaglutide)   What is this medicine used for: Used to lower your blood sugar and treat your diabetes  How is this medicine supplied? One pen is for one use only Each box contains 4 pens  How to take the medicine:  Preparing the pen before each use: Remove the pen from the refrigerator Make sure the pen is locked Hold the pen straight up and pull the gray base cap straight off. Throw the base cap away in your trash can. Using the pen and injecting your dose: Clean the injection site with an alcohol swab or soap and water. Do not touch the injection site after cleaning. Place the clear base firmly against the skin on your stomach, thigh, or upper arm Unlock the pen by turning the [employer] to the unlock symbol. Press down and hold on the green injection button. You will hear a loud click. Continue holding the pen against your skin until you hear a second click (about 5 to 10 seconds) Remove the pen from your skin  Immediately throw away the pen by placing in a puncture-resistant/sharps container The injection is given once per week.  Use a different injection site each week.  Most common side effects: Nausea Diarrhea Vomiting Decreased appetite  Storage: Store unused pens in the refrigerator. Do not freeze. Your pen can be stored at room temperature for up to a total of 14 days.

## 2020-12-21 ENCOUNTER — Ambulatory Visit: Payer: Medicare HMO

## 2020-12-21 ENCOUNTER — Other Ambulatory Visit: Payer: Self-pay

## 2020-12-25 DIAGNOSIS — E1140 Type 2 diabetes mellitus with diabetic neuropathy, unspecified: Secondary | ICD-10-CM | POA: Diagnosis not present

## 2020-12-25 DIAGNOSIS — I10 Essential (primary) hypertension: Secondary | ICD-10-CM | POA: Diagnosis not present

## 2020-12-25 DIAGNOSIS — E785 Hyperlipidemia, unspecified: Secondary | ICD-10-CM

## 2020-12-26 ENCOUNTER — Ambulatory Visit (INDEPENDENT_AMBULATORY_CARE_PROVIDER_SITE_OTHER): Payer: Medicare HMO | Admitting: Pharmacist

## 2020-12-26 DIAGNOSIS — E785 Hyperlipidemia, unspecified: Secondary | ICD-10-CM

## 2020-12-26 DIAGNOSIS — E1140 Type 2 diabetes mellitus with diabetic neuropathy, unspecified: Secondary | ICD-10-CM

## 2020-12-26 DIAGNOSIS — I10 Essential (primary) hypertension: Secondary | ICD-10-CM

## 2020-12-26 NOTE — Patient Instructions (Signed)
RAYSON RANDO,  It was great to talk to you today!  Please call me with any questions or concerns.   Visit Information  Patient verbalizes understanding of instructions provided today and agrees to view in MyChart.   Telephone follow up appointment with care management team member scheduled for:01/16/21  Domenic Moras, PharmD Clinical Pharmacist [facility] [phone]

## 2020-12-26 NOTE — Chronic Care Management (AMB) (Signed)
Chronic Care Management Pharmacy Note  12/26/2020 Name:  Justin Klein MRN:  [id number] DOB:  May 19, 1960  Summary:  Type 2 Diabetes Patient reports that he will receive his first shipment of Trulicity on 29/7/98. Patient receives from [facility] and has been approved for Trulicity through 92/11/94.  Patient aware to initiate Trulicity when he receives and to discontinue glipizide 5 mg by mouth daily. Will start Trulicity at 1.74 mg subcutaneously once weekly. Can titrate dose to obtain more weight loss if desired by patient.  Hyperlipidemia Discussed importance of statin use in diabetes for primary prevention. Discussed trial of alternative statin such as pravastatin. Patient plans to come for lipid panel in 3 weeks and wants to delay start of statin until after labs. He is aware that statin is indicated regardless of LDL level. He plans to start statin late November 2022. Will follow-up in a few weeks. If LDL remains above goal on maximally tolerated statin, may consider addition of ezetimibe 10 mg by mouth daily  Subjective: Justin Klein is an 60 y.o. year old male who is a primary patient of Noreene Larsson, NP.  The CCM team was consulted for assistance with disease management and care coordination needs.    Engaged with patient by telephone for follow up visit in response to provider referral for pharmacy case management and/or care coordination services.   Consent to Services:  The patient was given information about Chronic Care Management services, agreed to services, and gave verbal consent prior to initiation of services.  Please see initial visit note for detailed documentation.   Patient Care Team: Noreene Larsson, NP as PCP - General (Nurse Practitioner) Beryle Lathe, [facility] (Pharmacist)  Objective:  Lab Results  Component Value Date   CREATININE 1.14 09/19/2020   CREATININE 1.12 05/24/2020   CREATININE 1.15 02/22/2020    Lab Results  Component Value  Date   HGBA1C 6.2 (H) 09/19/2020   Last diabetic Eye exam: No results found for: HMDIABEYEEXA  Last diabetic Foot exam: No results found for: HMDIABFOOTEX      Component Value Date/Time   CHOL 173 09/19/2020 0835   TRIG 62 09/19/2020 0835   HDL 41 09/19/2020 0835   CHOLHDL 4.5 02/22/2020 0904   LDLCALC 120 (H) 09/19/2020 0835    Hepatic Function Latest Ref Rng & Units 09/19/2020 05/24/2020 02/22/2020  Total Protein 6.0 - 8.5 g/dL 7.2 7.6 7.7  Albumin 3.8 - 4.9 g/dL 4.4 4.4 4.5  AST 0 - 40 IU/L _0 ALT 0 - 44 IU/L _1 Alk Phosphatase 44 - 121 IU/L 74 84 88  Total Bilirubin 0.0 - 1.2 mg/dL 0.3 0.4 0.4    Lab Results  Component Value Date/Time   TSH 3.890 09/19/2020 08:35 AM   TSH 3.190 05/24/2020 01:04 PM   FREET4 1.17 09/19/2020 08:35 AM   FREET4 1.16 05/24/2020 01:04 PM    CBC Latest Ref Rng & Units 09/19/2020 05/24/2020 02/22/2020  WBC 3.4 - 10.8 x10E3/uL 4.0 5.5 5.1  Hemoglobin 13.0 - 17.7 g/dL 13.2 13.5 14.2  Hematocrit 37.5 - 51.0 % 39.3 41.7 43.2  Platelets 150 - 450 x10E3/uL 245 286 247    No results found for: VD25OH  Clinical ASCVD: No  The 10-year ASCVD risk score (Arnett DK, et al., 2019) is: 27.6%   Values used to calculate the score:     Age: 73 years     Sex: Male     Is Non-Hispanic  African American: Yes     Diabetic: Yes     Tobacco smoker: No     Systolic Blood Pressure: 814 mmHg     Is BP treated: Yes     HDL Cholesterol: 41 mg/dL     Total Cholesterol: 173 mg/dL    Social History   Tobacco Use  Smoking Status Former   Types: Cigarettes  Smokeless Tobacco Never  Tobacco Comments   2012 quit   BP Readings from Last 3 Encounters:  11/22/20 138/81  09/19/20 129/79  05/22/20 134/80   Pulse Readings from Last 3 Encounters:  11/22/20 60  09/19/20 61  05/22/20 63   Wt Readings from Last 3 Encounters:  11/22/20 207 lb 1.3 oz (93.9 kg)  09/19/20 209 lb (94.8 kg)  05/22/20 224 lb (101.6 kg)    Assessment: Review of patient  past medical history, allergies, medications, health status, including review of consultants reports, laboratory and other test data, was performed as part of comprehensive evaluation and provision of chronic care management services.   SDOH:  (Social Determinants of Health) assessments and interventions performed:    CCM Care Plan  Allergies  Allergen Reactions   Metformin And Related Other (See Comments)    Low B12, gait disturbance, myopathy, neuropathy, pernicious anemia    Crestor [Rosuvastatin] Other (See Comments)    "Made me feel sluggish and drained" Planning to retry on 12/14/20   Shellfish Allergy Nausea And Vomiting    Seafood-upset stomach    Medications Reviewed Today     Reviewed by Beryle Lathe, [facility] (Pharmacist) on 12/14/20 at [place] List Status: <None>   Medication Order Taking? Sig Documenting Provider Last Dose Status Informant  albuterol (VENTOLIN HFA) 108 (90 Base) MCG/ACT inhaler [id number] Yes INHALE1 OR 2 PUFFS INTO THE LUNGS EVERY SIX HOURS AS NEEDED FOR WHEEZING OR SHORTNESS OF BREATH. Perlie Mayo, NP Taking Active            Med Note Beryle Lathe   Tue Dec 12, 2020  3:43 PM) Has not needed to use in a while  Alcohol Swabs (B-D SINGLE USE SWABS REGULAR) PADS [id number]  USE AS DIRECTED Noreene Larsson, NP  Active   Cholecalciferol (VITAMIN D-3) 125 MCG (5000 UT) TABS [id number] Yes Take 5,000 Units by mouth daily. [provider] Taking Active   Cyanocobalamin (B-12) 1000 MCG CAPS [id number] Yes Take 1,000 mcg by mouth daily. [provider] Taking Active   diltiazem (CARDIZEM) 30 MG tablet [id number] Yes Take 1 tablet (30 mg total) by mouth daily. Perlie Mayo, NP Taking Active            Med Note ([place], [place] Dec 12, 2020  3:45 PM) As needed for SVT ~2x/wk  Dulaglutide (TRULICITY) 3.66 QH/4.7ML SOPN [id number] No Inject 0.75 mg into the skin once a week for 48 doses.  Patient not taking: Reported  on 12/14/2020   Lindell Spar, MD Not Taking Active            Med Note Jim Like Dec 14, 2020  3:07 PM) Approved for patient assistance through [employer] through 02/24/21  fluticasone (CUTIVATE) 0.05 % cream [id number] Yes APPLY TO AFFECTED AREASCON FACE UP TO TWICE DAILYTAS NEEDED FOR RASH. Perlie Mayo, NP Taking Active   fluticasone furoate-vilanterol (BREO ELLIPTA) 100-25 MCG/INH AEPB [id number] Yes Inhale 1 puff into the lungs daily. Perlie Mayo, NP Taking Active  Med Note Beryle Lathe   Tue Dec 12, 2020  3:44 PM) Only using as needed for asthma. Does not need to use often  gentamicin cream (GARAMYCIN) 0.1 % [id number] Yes Apply 1 application topically 2 (two) times daily. Edrick Kins, DPM Taking Active            Med Note Beryle Lathe   Tue Dec 12, 2020  3:48 PM) Uses on toe  glipiZIDE (GLUCOTROL XL) 5 MG 24 hr tablet [id number] Yes Take 1 tablet (5 mg total) by mouth daily with breakfast. Noreene Larsson, NP Taking Active   levothyroxine (SYNTHROID) 150 MCG tablet [id number] Yes Take 1 tablet (150 mcg total) by mouth every morning. Noreene Larsson, NP Taking Active   Omega-3 Fatty Acids (FISH OIL) 1000 MG CPDR [id number] Yes Take 1 capsule by mouth daily. [provider] Taking Active   rosuvastatin (CRESTOR) 10 MG tablet [id number] No Take 1 tablet (10 mg total) by mouth daily.  Patient not taking: No sig reported   Noreene Larsson, NP Not Taking Active   sildenafil (VIAGRA) 100 MG tablet [id number] Yes Take 1 tablet (100 mg total) by mouth as needed for erectile dysfunction. Perlie Mayo, NP Taking Active   TRUE METRIX BLOOD GLUCOSE TEST test strip [id number]  TEST DAILY Perlie Mayo, NP  Active   TRUEplus Lancets 33G MISC [id number]  TEST DAILY Noreene Larsson, NP  Active   UNABLE TO FIND [id number]  True Metrix meter, True Metrix test strips, lancets, control solution, and alcohol swabs. Perlie Mayo, NP  Active              Patient Active Problem List   Diagnosis Date Noted   Insomnia 09/19/2020   Falls 05/22/2020   Annual visit for general adult medical examination with abnormal findings 02/23/2020   Elevated lipoprotein(a) 02/23/2020   Essential hypertension 10/27/2019   History of shingles 10/27/2019   Erectile dysfunction 08/20/2019   Type 2 diabetes mellitus with diabetic neuropathy, without long-term current use of insulin ([place]) 05/11/2019   Obesity (BMI 30.0-34.9) 05/11/2019   Hypothyroidism 05/11/2019   Ingrown left big toenail 05/11/2019   Asthma 08/26/2011    Immunization History  Administered Date(s) Administered   Janssen (J&J) SARS-COV-2 Vaccination 06/01/2019   Moderna Sars-Covid-2 Vaccination 01/07/2020    Conditions to be addressed/monitored: HTN, HLD, and DMII  Care Plan : Medication Management  Updates made by Beryle Lathe, [place] since 12/26/2020 12:00 AM     Problem: T2DM, HLD, HTN   Priority: High  Onset Date: 12/12/2020     Long-Range Goal: Disease Progression Prevention   Start Date: 12/12/2020  Expected End Date: 03/12/2021  Recent Progress: On track  Priority: High  Note:   Current Barriers:  Unable to independently afford treatment regimen Unable to achieve control of hyperlipidemia Suboptimal therapeutic regimen for hyperlipidemia  Pharmacist Clinical Goal(s):  Patient will verbalize ability to afford treatment regimen achieve control of hyperlipidemia as evidenced by improved LDL adhere to plan to optimize therapeutic regimen for diabetes and hyperlipidemia as evidenced by report of adherence to recommended medication management changes through collaboration with PharmD and provider.   Interventions: 1:1 collaboration with Noreene Larsson, NP regarding development and update of comprehensive plan of care as evidenced by provider attestation and co-signature Inter-disciplinary care team collaboration (see longitudinal plan of  care) Comprehensive medication review performed; medication list updated in electronic medical record  Type 2 Diabetes - Goal on track:  YES.: Controlled; Most recent A1c at goal of <7% per ADA guidelines Current medications: glipizide XL 5 mg by mouth once daily Patient reports that he will receive his first shipment of Trulicity on 73/7/10. Patient receives from [facility] and has been approved for Trulicity through 62/69/48.  Intolerances:  metformin Taking medications as directed: yes Side effects thought to be attributed to current medication regimen: no Hypoglycemia prevention: not indicated at this time Current meal patterns:  patient reports reducing carbohydrate and fat intake Current exercise: yard work and has cardio equipment at his home On a statin: no, not tolerating rosuvastatin so he stopped taking. On aspirin 81 mg daily: no Last microalbumin: 10; on an ACEi/ARB: no Last eye exam: unknown Last foot exam: completed within last year Pneumonia vaccine: unknown Shingles: unknown Influenza vaccine:  needs this fall Current glucose readings:  not discussed today Provided extensive education on medication therapy options Encouraged regular aerobic exercise with a goal of 30 minutes five times per week (150 minutes per week) Patient identified as a good candidate for a GLP-1 receptor agonist given reduction in cardiovascular disease, slowed chronic kidney disease progression, low risk of hypoglycemia, increased satiety , and weight loss. Patient denies a personal or family history of medullary thyroid carcinoma (MTC) or Multiple Endocrine Neoplasia syndrome type 2 (MEN 2). Patient also denies any history of pancreatitis or biliary disease. Patient aware to initiate Trulicity when he receives and to discontinue glipizide 5 mg by mouth daily. Will start Trulicity at 5.46 mg subcutaneously once weekly. Can titrate dose to obtain more weight loss if desired by patient.  Hypertension -  Condition stable. Not addressed this visit.: Blood pressure under good control. Blood pressure is at goal of <130/80 mmHg per 2017 AHA/ACC guidelines. Current medications: diltiazem 30 mg by mouth as needed for SVT Current home blood pressure: not discussed today but will discuss at future visits Encourage dietary sodium restriction/DASH diet Recommend regular aerobic exercise Recommend home blood pressure monitoring to discuss at next visit Discussed need for and importance of continued work on weight loss Continue current management as above Discussed vagal maneuvers to abort SVT as well  Hyperlipidemia - Goal on track: YES.: Uncontrolled. LDL above goal of <70 due to very high risk given diabetes + at least 1 additional major risk factor (hypertension) per 2020 AACE/ACE guidelines. Triglycerides at goal of <150 per 2020 AACE/ACE guidelines. Current medications:  none Intolerances:  rosuvastatin (sluggish and drained energy) Taking medications as directed: no, patient reports he has stopped taking rosuvastatin due to adverse effects Encourage dietary reduction of high fat containing foods such as butter, nuts, bacon, egg yolks, etc. Recommend regular aerobic exercise Discussed need for and importance of continued work on weight loss Reviewed risks of hyperlipidemia, principles of treatment and consequences of untreated hyperlipidemia Re-check lipid panel in 4-12 weeks Discussed importance of statin use in diabetes for primary prevention. Discussed trial of alternative statin such as pravastatin. Patient plans to come for lipid panel in 3 weeks and wants to delay start of statin until after labs. He is aware that statin is indicated regardless of LDL level. He plans to start statin late November 2022. Will follow-up in a few weeks. If LDL remains above goal on maximally tolerated statin, may consider addition of ezetimibe 10 mg by mouth daily  Patient Goals/Self-Care Activities Patient will:   Take medications as prescribed Collaborate with provider on medication access solutions Target a minimum of 150 minutes of moderate intensity exercise weekly Engage in  dietary modifications by less frequent dining out, decreased fat intake, and fewer sweetened foods & beverages  Follow Up Plan: Telephone follow up appointment with care management team member scheduled for: 01/16/21      Medication Assistance:  [place] for Trulicity through 94/70/96  Patient's preferred pharmacy is:  [facility], [place] [address] Phone: [phone] Fax: [phone]  [facility] [address], [place] [address] Phone: [phone] Fax: [phone]  Follow Up:  Patient agrees to Care Plan and Follow-up.  Plan: Telephone follow up appointment with care management team member scheduled for:  01/16/21  Kennon Holter, PharmD Clinical Pharmacist [facility] [phone]

## 2021-01-04 ENCOUNTER — Other Ambulatory Visit: Payer: Self-pay | Admitting: *Deleted

## 2021-01-04 MED ORDER — FLUTICASONE PROPIONATE 0.05 % EX CREA: TOPICAL_CREAM | CUTANEOUS | 3 refills | Status: DC

## 2021-01-16 ENCOUNTER — Ambulatory Visit: Payer: Medicare HMO | Admitting: Pharmacist

## 2021-01-16 DIAGNOSIS — E1140 Type 2 diabetes mellitus with diabetic neuropathy, unspecified: Secondary | ICD-10-CM

## 2021-01-16 DIAGNOSIS — E785 Hyperlipidemia, unspecified: Secondary | ICD-10-CM

## 2021-01-16 DIAGNOSIS — J4520 Mild intermittent asthma, uncomplicated: Secondary | ICD-10-CM

## 2021-01-16 DIAGNOSIS — I10 Essential (primary) hypertension: Secondary | ICD-10-CM

## 2021-01-16 MED ORDER — ALBUTEROL SULFATE HFA 108 (90 BASE) MCG/ACT IN AERS: INHALATION_SPRAY | RESPIRATORY_TRACT | 0 refills | Status: DC

## 2021-01-16 NOTE — Patient Instructions (Signed)
Justin Klein,  It was great to talk to you today!  Please call me with any questions or concerns.   Visit Information  Following are the goals we discussed today:  Patient Goals/Self-Care Activities Patient will:  Take medications as prescribed Collaborate with provider on medication access solutions Target a minimum of 150 minutes of moderate intensity exercise weekly Engage in dietary modifications by less frequent dining out, decreased fat intake, and fewer sweetened foods & beverages  Plan: Telephone follow up appointment with care management team member scheduled for:  01/23/21  Domenic Moras, PharmD, BCACP Clinical Pharmacist [facility] [phone]   Please call the care guide team at [phone] if you need to cancel or reschedule your appointment.   Patient verbalizes understanding of instructions provided today and agrees to view in MyChart.

## 2021-01-16 NOTE — Chronic Care Management (AMB) (Signed)
Chronic Care Management Pharmacy Note  01/16/2021 Name:  Justin Klein MRN:  [id number] DOB:  29-Sep-1960  Summary:  Other: Patient requests refill of albuterol today. Discussed with PCP and refill sent to pharmacy. Patient has asthma. Rarely needs albuterol but with recent cold weather is having some mild shortness of breath/wheezing  Type 2 Diabetes  Continue Trulicity 6.73 mg subcutaneously once weekly. Can titrate dose to obtain more weight loss if desired by patient. Receives from [facility] - approved for Trulicity through 41/93/79 Recently discontinued glipizide No hypoglycemia Blood glucose stable in the low 100s fasting On a statin: no, but plans to restart taking rosuvastatin after discussion today  Hyperlipidemia Discussed importance of statin use in diabetes for primary prevention.  Patient had lipid panel drawn today. Will call next week to discuss results.  Patient plans to restart rosuvastatin 10 mg by mouth daily. He is aware that statin is indicated regardless of LDL level.  If LDL remains above goal on maximally tolerated statin, may consider addition of ezetimibe 10 mg by mouth daily.  Subjective: AYDIN HINK is an 60 y.o. year old male who is a primary patient of Noreene Larsson, NP.  The CCM team was consulted for assistance with disease management and care coordination needs.    Engaged with patient by telephone for follow up visit in response to provider referral for pharmacy case management and/or care coordination services.   Consent to Services:  The patient was given information about Chronic Care Management services, agreed to services, and gave verbal consent prior to initiation of services.  Please see initial visit note for detailed documentation.   Patient Care Team: Noreene Larsson, NP as PCP - General (Nurse Practitioner) Beryle Lathe, [facility] (Pharmacist)  Objective:  Lab Results  Component Value Date   CREATININE 1.14  09/19/2020   CREATININE 1.12 05/24/2020   CREATININE 1.15 02/22/2020    Lab Results  Component Value Date   HGBA1C 6.2 (H) 09/19/2020   Last diabetic Eye exam: No results found for: HMDIABEYEEXA  Last diabetic Foot exam: No results found for: HMDIABFOOTEX      Component Value Date/Time   CHOL 173 09/19/2020 0835   TRIG 62 09/19/2020 0835   HDL 41 09/19/2020 0835   CHOLHDL 4.5 02/22/2020 0904   LDLCALC 120 (H) 09/19/2020 0835    Hepatic Function Latest Ref Rng & Units 09/19/2020 05/24/2020 02/22/2020  Total Protein 6.0 - 8.5 g/dL 7.2 7.6 7.7  Albumin 3.8 - 4.9 g/dL 4.4 4.4 4.5  AST 0 - 40 IU/L '24 29 24  ' ALT 0 - 44 IU/L '12 27 20  ' Alk Phosphatase 44 - 121 IU/L 74 84 88  Total Bilirubin 0.0 - 1.2 mg/dL 0.3 0.4 0.4    Lab Results  Component Value Date/Time   TSH 3.890 09/19/2020 08:35 AM   TSH 3.190 05/24/2020 01:04 PM   FREET4 1.17 09/19/2020 08:35 AM   FREET4 1.16 05/24/2020 01:04 PM    CBC Latest Ref Rng & Units 09/19/2020 05/24/2020 02/22/2020  WBC 3.4 - 10.8 x10E3/uL 4.0 5.5 5.1  Hemoglobin 13.0 - 17.7 g/dL 13.2 13.5 14.2  Hematocrit 37.5 - 51.0 % 39.3 41.7 43.2  Platelets 150 - 450 x10E3/uL 245 286 247    No results found for: VD25OH  Clinical ASCVD: No  The 10-year ASCVD risk score (Arnett DK, et al., 2019) is: 27.6%   Values used to calculate the score:     Age: 26 years  Sex: Male     Is Non-Hispanic African American: Yes     Diabetic: Yes     Tobacco smoker: No     Systolic Blood Pressure: 163 mmHg     Is BP treated: Yes     HDL Cholesterol: 41 mg/dL     Total Cholesterol: 173 mg/dL    Social History   Tobacco Use  Smoking Status Former   Types: Cigarettes  Smokeless Tobacco Never  Tobacco Comments   2012 quit   BP Readings from Last 3 Encounters:  11/22/20 138/81  09/19/20 129/79  05/22/20 134/80   Pulse Readings from Last 3 Encounters:  11/22/20 60  09/19/20 61  05/22/20 63   Wt Readings from Last 3 Encounters:  11/22/20 207 lb 1.3  oz (93.9 kg)  09/19/20 209 lb (94.8 kg)  05/22/20 224 lb (101.6 kg)    Assessment: Review of patient past medical history, allergies, medications, health status, including review of consultants reports, laboratory and other test data, was performed as part of comprehensive evaluation and provision of chronic care management services.   SDOH:  (Social Determinants of Health) assessments and interventions performed:    CCM Care Plan  Allergies  Allergen Reactions   Metformin And Related Other (See Comments)    Low B12, gait disturbance, myopathy, neuropathy, pernicious anemia    Crestor [Rosuvastatin] Other (See Comments)    "Made me feel sluggish and drained" Planning to retry on 12/14/20   Shellfish Allergy Nausea And Vomiting    Seafood-upset stomach    Medications Reviewed Today     Reviewed by Beryle Lathe, [facility] (Pharmacist) on 01/16/21 at 1524  Med List Status: <None>   Medication Order Taking? Sig Documenting Provider Last Dose Status Informant  albuterol (VENTOLIN HFA) 108 (90 Base) MCG/ACT inhaler [id number] Yes INHALE1 OR 2 PUFFS INTO THE LUNGS EVERY SIX HOURS AS NEEDED FOR WHEEZING OR SHORTNESS OF BREATH. Perlie Mayo, NP Taking Active            Med Note Beryle Lathe   Tue Dec 12, 2020  3:43 PM) Has not needed to use in a while  Alcohol Swabs (B-D SINGLE USE SWABS REGULAR) PADS [id number] No USE AS DIRECTED Noreene Larsson, NP Unknown Active   Cholecalciferol (VITAMIN D-3) 125 MCG (5000 UT) TABS [id number] Yes Take 5,000 Units by mouth daily. [provider] Taking Active   Cyanocobalamin (B-12) 1000 MCG CAPS [id number] Yes Take 1,000 mcg by mouth daily. [provider] Taking Active   diltiazem (CARDIZEM) 30 MG tablet [id number] Yes Take 1 tablet (30 mg total) by mouth daily. Perlie Mayo, NP Taking Active            Med Note Beryle Lathe   Tue Dec 12, 2020  3:45 PM) As needed for SVT ~2x/wk  Dulaglutide (TRULICITY) 1.79  XT/0.5WP SOPN [id number] Yes Inject 0.75 mg into the skin once a week for 48 doses. Lindell Spar, MD Taking Active            Med Note Beryle Lathe   Tue Jan 16, 2021  3:24 PM)    fluticasone (CUTIVATE) 0.05 % cream [id number] Yes APPLY TO AFFECTED AREASCON FACE UP TO TWICE DAILYTAS NEEDED FOR RASH. Noreene Larsson, NP Taking Active   fluticasone furoate-vilanterol (BREO ELLIPTA) 100-25 MCG/INH AEPB [id number] Yes Inhale 1 puff into the lungs daily. Perlie Mayo, NP Taking Active  Med Note Beryle Lathe   Tue Dec 12, 2020  3:44 PM) Only using as needed for asthma. Does not need to use often  gentamicin cream (GARAMYCIN) 0.1 % [id number] Yes Apply 1 application topically 2 (two) times daily. Edrick Kins, DPM Taking Active            Med Note ([place] Dec 12, 2020  3:48 PM) Uses on toe  levothyroxine (SYNTHROID) 150 MCG tablet [id number] Yes Take 1 tablet (150 mcg total) by mouth every morning. Noreene Larsson, NP Taking Active   Omega-3 Fatty Acids (FISH OIL) 1000 MG CPDR [id number] Yes Take 1 capsule by mouth daily. [provider] Taking Active   rosuvastatin (CRESTOR) 10 MG tablet [id number] Yes Take 1 tablet (10 mg total) by mouth daily. Noreene Larsson, NP Taking Active            Med Note [place] Dec 26, 2020  8:56 AM) Plans to start late November 2022 after repeat lipid panel  sildenafil (VIAGRA) 100 MG tablet [id number] Yes Take 1 tablet (100 mg total) by mouth as needed for erectile dysfunction. Perlie Mayo, NP Taking Active   TRUE METRIX BLOOD GLUCOSE TEST test strip [id number]  TEST DAILY Perlie Mayo, NP  Active   TRUEplus Lancets 33G MISC [id number]  TEST DAILY Noreene Larsson, NP  Active   UNABLE TO FIND [id number]  True Metrix meter, True Metrix test strips, lancets, control solution, and alcohol swabs. Perlie Mayo, NP  Active             Patient Active Problem List   Diagnosis Date Noted    Insomnia 09/19/2020   Falls 05/22/2020   Annual visit for general adult medical examination with abnormal findings 02/23/2020   Elevated lipoprotein(a) 02/23/2020   Essential hypertension 10/27/2019   History of shingles 10/27/2019   Erectile dysfunction 08/20/2019   Type 2 diabetes mellitus with diabetic neuropathy, without long-term current use of insulin ([place]) 05/11/2019   Obesity (BMI 30.0-34.9) 05/11/2019   Hypothyroidism 05/11/2019   Ingrown left big toenail 05/11/2019   Asthma 08/26/2011    Immunization History  Administered Date(s) Administered   Janssen (J&J) SARS-COV-2 Vaccination 06/01/2019   Moderna Sars-Covid-2 Vaccination 01/07/2020    Conditions to be addressed/monitored: HTN, HLD, and DMII  Care Plan : Medication Management  Updates made by Beryle Lathe, [place] since 01/16/2021 12:00 AM     Problem: T2DM, HLD, HTN   Priority: High  Onset Date: 12/12/2020     Long-Range Goal: Disease Progression Prevention   Start Date: 12/12/2020  Expected End Date: 03/12/2021  Recent Progress: On track  Priority: High  Note:   Current Barriers:  Unable to independently afford treatment regimen Unable to achieve control of hyperlipidemia Suboptimal therapeutic regimen for hyperlipidemia  Pharmacist Clinical Goal(s):  Patient will verbalize ability to afford treatment regimen achieve control of hyperlipidemia as evidenced by improved LDL adhere to plan to optimize therapeutic regimen for diabetes and hyperlipidemia as evidenced by report of adherence to recommended medication management changes through collaboration with PharmD and provider.   Interventions: 1:1 collaboration with Noreene Larsson, NP regarding development and update of comprehensive plan of care as evidenced by provider attestation and co-signature Inter-disciplinary care team collaboration (see longitudinal plan of care) Comprehensive medication review performed; medication list updated in  electronic medical record  Type 2 Diabetes - Goal on track: YES.: Controlled; Most recent A1c  at goal of <7% per ADA guidelines Current medications: dulaglutide (Trulicity) 4.27 mg subcutaneously weekly (receives from [facility] - approved for Trulicity through 08/18/74) Recently discontinued glipizide Intolerances:  metformin Taking medications as directed: yes Side effects thought to be attributed to current medication regimen: no Hypoglycemia prevention: not indicated at this time Current meal patterns:  patient reports reducing carbohydrate and fat intake Current exercise: yard work and has cardio equipment at his home On a statin: no, but plans to restart taking rosuvastatin after discussion today On aspirin 81 mg daily: no Last microalbumin: 10; on an ACEi/ARB: no Last eye exam: unknown Last foot exam: completed within last year Pneumonia vaccine: unknown Shingles: unknown Influenza vaccine:  needs this fall Current glucose readings:  fasting: 104-109 over last few days Provided extensive education on medication therapy options Encouraged regular aerobic exercise with a goal of 30 minutes five times per week (150 minutes per week) Patient identified as a good candidate for a GLP-1 receptor agonist given reduction in cardiovascular disease, slowed chronic kidney disease progression, low risk of hypoglycemia, increased satiety , and weight loss. Patient denies a personal or family history of medullary thyroid carcinoma (MTC) or Multiple Endocrine Neoplasia syndrome type 2 (MEN 2). Patient also denies any history of pancreatitis or biliary disease. Continue Trulicity 2.83 mg subcutaneously once weekly. Can titrate dose to obtain more weight loss if desired by patient.  Hypertension - Condition stable. Not addressed this visit.: Blood pressure under good control. Blood pressure is at goal of <130/80 mmHg per 2017 AHA/ACC guidelines. Current medications: diltiazem 30 mg by mouth as  needed for SVT Current home blood pressure: not discussed today but will discuss at future visits Encourage dietary sodium restriction/DASH diet Recommend regular aerobic exercise Recommend home blood pressure monitoring to discuss at next visit Discussed need for and importance of continued work on weight loss Continue current management as above Discussed vagal maneuvers to abort SVT as well  Hyperlipidemia - Goal on track: YES. Uncontrolled. LDL above goal of <70 due to very high risk given 10-year risk >20% per 2020 AACE/ACE guidelines. Triglycerides at goal of <150 per 2020 AACE/ACE guidelines. Current medications:  none Intolerances:  rosuvastatin (sluggish and drained energy) Taking medications as directed: no, patient reports he has stopped taking rosuvastatin due to adverse effects Encourage dietary reduction of high fat containing foods such as butter, nuts, bacon, egg yolks, etc. Recommend regular aerobic exercise Discussed need for and importance of continued work on weight loss Reviewed risks of hyperlipidemia, principles of treatment and consequences of untreated hyperlipidemia Re-check lipid panel in 4-12 weeks Discussed importance of statin use in diabetes for primary prevention. Patient had lipid panel drawn today. He plans to restart rosuvastatin 10 mg by mouth daily. He is aware that statin is indicated regardless of LDL level. If LDL remains above goal on maximally tolerated statin, may consider addition of ezetimibe 10 mg by mouth daily  Patient Goals/Self-Care Activities Patient will:  Take medications as prescribed Collaborate with provider on medication access solutions Target a minimum of 150 minutes of moderate intensity exercise weekly Engage in dietary modifications by less frequent dining out, decreased fat intake, and fewer sweetened foods & beverages  Follow Up Plan: Telephone follow up appointment with care management team member scheduled for: 01/23/21       Medication Assistance:  See care plan for more details  Patient's preferred pharmacy is:  [facility] [place] [place]  [place] Phone: [phone] Fax: [phone]  [facility] - [address] [place] [address] Phone: [phone] Fax: [phone]  Follow Up:  Patient agrees to Care Plan and Follow-up.  Plan: Telephone follow up appointment with care management team member scheduled for:  01/23/21  Kennon Holter, PharmD, [facility] Clinical Pharmacist [facility] [phone]

## 2021-01-17 LAB — CMP14+EGFR
ALT: 9 IU/L (ref 0–44)
AST: 17 IU/L (ref 0–40)
Albumin/Globulin Ratio: 1.6 (ref 1.2–2.2)
Albumin: 4.5 g/dL (ref 3.8–4.9)
Alkaline Phosphatase: 85 IU/L (ref 44–121)
BUN/Creatinine Ratio: 15 (ref 10–24)
BUN: 16 mg/dL (ref 8–27)
Bilirubin Total: 0.3 mg/dL (ref 0.0–1.2)
CO2: 23 mmol/L (ref 20–29)
Calcium: 10.1 mg/dL (ref 8.6–10.2)
Chloride: 104 mmol/L (ref 96–106)
Creatinine, Ser: 1.07 mg/dL (ref 0.76–1.27)
Globulin, Total: 2.8 g/dL (ref 1.5–4.5)
Glucose: 115 mg/dL — ABNORMAL HIGH (ref 70–99)
Potassium: 4.4 mmol/L (ref 3.5–5.2)
Sodium: 140 mmol/L (ref 134–144)
Total Protein: 7.3 g/dL (ref 6.0–8.5)
eGFR: 79 mL/min/{1.73_m2} (ref >59)

## 2021-01-17 LAB — CBC WITH DIFFERENTIAL/PLATELET
Basophils Absolute: 0 10*3/uL (ref 0.0–0.2)
Basos: 1 %
EOS (ABSOLUTE): 0.1 10*3/uL (ref 0.0–0.4)
Eos: 1 %
Hematocrit: 40.5 % (ref 37.5–51.0)
Hemoglobin: 13.2 g/dL (ref 13.0–17.7)
Immature Grans (Abs): 0 10*3/uL (ref 0.0–0.1)
Immature Granulocytes: 0 %
Lymphocytes Absolute: 1.3 10*3/uL (ref 0.7–3.1)
Lymphs: 32 %
MCH: 27.6 pg (ref 26.6–33.0)
MCHC: 32.6 g/dL (ref 31.5–35.7)
MCV: 85 fL (ref 79–97)
Monocytes Absolute: 0.4 10*3/uL (ref 0.1–0.9)
Monocytes: 10 %
Neutrophils Absolute: 2.2 10*3/uL (ref 1.4–7.0)
Neutrophils: 56 %
Platelets: 206 10*3/uL (ref 150–450)
RBC: 4.78 x10E6/uL (ref 4.14–5.80)
RDW: 14.6 % (ref 11.6–15.4)
WBC: 3.9 10*3/uL (ref 3.4–10.8)

## 2021-01-17 LAB — HEMOGLOBIN A1C
Est. average glucose Bld gHb Est-mCnc: 134 mg/dL
Hgb A1c MFr Bld: 6.3 % — ABNORMAL HIGH (ref 4.8–5.6)

## 2021-01-17 LAB — LIPID PANEL WITH LDL/HDL RATIO
Cholesterol, Total: 150 mg/dL (ref 100–199)
HDL: 36 mg/dL — ABNORMAL LOW (ref >39)
LDL Chol Calc (NIH): 92 mg/dL (ref 0–99)
LDL/HDL Ratio: 2.6 ratio (ref 0.0–3.6)
Triglycerides: 120 mg/dL (ref 0–149)
VLDL Cholesterol Cal: 22 mg/dL (ref 5–40)

## 2021-01-23 ENCOUNTER — Ambulatory Visit: Payer: Medicare HMO | Admitting: Pharmacist

## 2021-01-23 DIAGNOSIS — E1140 Type 2 diabetes mellitus with diabetic neuropathy, unspecified: Secondary | ICD-10-CM

## 2021-01-23 DIAGNOSIS — I10 Essential (primary) hypertension: Secondary | ICD-10-CM

## 2021-01-23 DIAGNOSIS — E785 Hyperlipidemia, unspecified: Secondary | ICD-10-CM

## 2021-01-23 NOTE — Patient Instructions (Signed)
TIA GELB,  It was great to talk to you today!  Please call me with any questions or concerns.   Visit Information  Following are the goals we discussed today:  Patient Goals/Self-Care Activities Patient will:  Take medications as prescribed Collaborate with provider on medication access solutions Target a minimum of 150 minutes of moderate intensity exercise weekly Engage in dietary modifications by less frequent dining out, decreased fat intake, and fewer sweetened foods & beverages  Plan: Telephone follow up appointment with care management team member scheduled for:  04/25/21  Domenic Moras, PharmD, BCACP Clinical Pharmacist [facility] [phone]   Please call the care guide team at [phone] if you need to cancel or reschedule your appointment.   Patient verbalizes understanding of instructions provided today and agrees to view in MyChart.

## 2021-01-23 NOTE — Chronic Care Management (AMB) (Addendum)
Chronic Care Management Pharmacy Note  01/23/2021 Name:  Justin Klein MRN:  [id number] DOB:  23-Jan-1961  Summary:  Type 2 Diabetes Patient continues to decline statin despite counseling on the need for primary prevention Continue Trulicity 0.86 mg subcutaneously once weekly. Can titrate dose to obtain more weight loss if desired by patient. Patient called his pharmacy for refill of OneTouch test strips but was notified that prior authorization was required as the preferred brands by his insurance is either Accu-chek or True-Metrix. Prior authorization submitted via CoverMyMeds. Key: [id number]. PA Case ID: [id number]. If prior authorization denied then patient will have to switch brands. He thinks he has received True-Metrix already.   Hyperlipidemia LDL above goal of <70 due to very high risk given 10-year risk >20% per 2020 AACE/ACE guidelines.  Discussed importance of statin use in diabetes for primary prevention. Patient had lipid panel drawn recently and was impressed with how much he was able to lower his LDL without the statin. He does not plan to take a statin at this time despite counseling provided to him today. He is aware of the risks of cardiovascular disease.  He is aware that statin is indicated regardless of LDL level. If LDL remains above goal at next visit, will recommend that he consider rosuvastatin 5-10 mg by mouth daily.  Subjective: Justin Klein is an 60 y.o. year old male who is a primary patient of Noreene Larsson, NP.  The CCM team was consulted for assistance with disease management and care coordination needs.    Engaged with patient by telephone for follow up visit in response to provider referral for pharmacy case management and/or care coordination services.   Consent to Services:  The patient was given information about Chronic Care Management services, agreed to services, and gave verbal consent prior to initiation of services.  Please see initial visit  note for detailed documentation.   Patient Care Team: Noreene Larsson, NP as PCP - General (Nurse Practitioner) Beryle Lathe, [facility] (Pharmacist)  Objective:  Lab Results  Component Value Date   CREATININE 1.07 01/16/2021   CREATININE 1.14 09/19/2020   CREATININE 1.12 05/24/2020    Lab Results  Component Value Date   HGBA1C 6.3 (H) 01/16/2021   Last diabetic Eye exam: No results found for: HMDIABEYEEXA  Last diabetic Foot exam: No results found for: HMDIABFOOTEX      Component Value Date/Time   CHOL 150 01/16/2021 0826   TRIG 120 01/16/2021 0826   HDL 36 (L) 01/16/2021 0826   CHOLHDL 4.5 02/22/2020 0904   LDLCALC 92 01/16/2021 0826    Hepatic Function Latest Ref Rng & Units 01/16/2021 09/19/2020 05/24/2020  Total Protein 6.0 - 8.5 g/dL 7.3 7.2 7.6  Albumin 3.8 - 4.9 g/dL 4.5 4.4 4.4  AST 0 - 40 IU/L '17 24 29  ' ALT 0 - 44 IU/L '9 12 27  ' Alk Phosphatase 44 - 121 IU/L 85 74 84  Total Bilirubin 0.0 - 1.2 mg/dL 0.3 0.3 0.4    Lab Results  Component Value Date/Time   TSH 3.890 09/19/2020 08:35 AM   TSH 3.190 05/24/2020 01:04 PM   FREET4 1.17 09/19/2020 08:35 AM   FREET4 1.16 05/24/2020 01:04 PM    CBC Latest Ref Rng & Units 01/16/2021 09/19/2020 05/24/2020  WBC 3.4 - 10.8 x10E3/uL 3.9 4.0 5.5  Hemoglobin 13.0 - 17.7 g/dL 13.2 13.2 13.5  Hematocrit 37.5 - 51.0 % 40.5 39.3 41.7  Platelets 150 - 450 x10E3/uL 206  245 286    No results found for: VD25OH  Clinical ASCVD: No  The 10-year ASCVD risk score (Arnett DK, et al., 2019) is: 27.5%   Values used to calculate the score:     Age: 60 years     Sex: Male     Is Non-Hispanic African American: Yes     Diabetic: Yes     Tobacco smoker: No     Systolic Blood Pressure: 268 mmHg     Is BP treated: Yes     HDL Cholesterol: 36 mg/dL     Total Cholesterol: 150 mg/dL   Social History   Tobacco Use  Smoking Status Former   Types: Cigarettes  Smokeless Tobacco Never  Tobacco Comments   2012 quit   BP Readings  from Last 3 Encounters:  11/22/20 138/81  09/19/20 129/79  05/22/20 134/80   Pulse Readings from Last 3 Encounters:  11/22/20 60  09/19/20 61  05/22/20 63   Wt Readings from Last 3 Encounters:  11/22/20 207 lb 1.3 oz (93.9 kg)  09/19/20 209 lb (94.8 kg)  05/22/20 224 lb (101.6 kg)    Assessment: Review of patient past medical history, allergies, medications, health status, including review of consultants reports, laboratory and other test data, was performed as part of comprehensive evaluation and provision of chronic care management services.   SDOH:  (Social Determinants of Health) assessments and interventions performed:    CCM Care Plan  Allergies  Allergen Reactions   Metformin And Related Other (See Comments)    Low B12, gait disturbance, myopathy, neuropathy, pernicious anemia    Crestor [Rosuvastatin] Other (See Comments)    "Made me feel sluggish and drained" Planning to retry on 12/14/20   Shellfish Allergy Nausea And Vomiting    Seafood-upset stomach    Medications Reviewed Today     Reviewed by Beryle Lathe, [facility] (Pharmacist) on 01/23/21 at 0903  Med List Status: <None>   Medication Order Taking? Sig Documenting Provider Last Dose Status Informant  albuterol (VENTOLIN HFA) 108 (90 Base) MCG/ACT inhaler [id number] Yes INHALE 1 OR 2 PUFFS INTO THE LUNGS EVERY SIX HOURS AS NEEDED FOR WHEEZING OR SHORTNESS OF BREATH. Lindell Spar, MD Taking Active   Alcohol Swabs (B-D SINGLE USE SWABS REGULAR) PADS [id number]  USE AS DIRECTED Noreene Larsson, NP  Active   Cholecalciferol (VITAMIN D-3) 125 MCG (5000 UT) TABS [id number] Yes Take 5,000 Units by mouth daily. [provider] Taking Active   Cyanocobalamin (B-12) 1000 MCG CAPS [id number] Yes Take 1,000 mcg by mouth daily. [provider] Taking Active   diltiazem (CARDIZEM) 30 MG tablet [id number] Yes Take 1 tablet (30 mg total) by mouth daily. Perlie Mayo, NP Taking Active            Med  Note Beryle Lathe   Tue Dec 12, 2020  3:45 PM) As needed for SVT ~2x/wk  Dulaglutide (TRULICITY) 5.02 DX/4.1OI SOPN [id number] Yes Inject 0.75 mg into the skin once a week for 48 doses. Lindell Spar, MD Taking Active            Med Note Beryle Lathe   Tue Jan 16, 2021  3:24 PM)    fluticasone (CUTIVATE) 0.05 % cream [id number] Yes APPLY TO AFFECTED AREASCON FACE UP TO TWICE DAILYTAS NEEDED FOR RASH. Noreene Larsson, NP Taking Active   fluticasone furoate-vilanterol (BREO ELLIPTA) 100-25 MCG/INH AEPB [id number] Yes Inhale 1 puff into the lungs daily. Perlie Mayo,  NP Taking Active            Med Note Beryle Lathe   Tue Dec 12, 2020  3:44 PM) Only using as needed for asthma. Does not need to use often  gentamicin cream (GARAMYCIN) 0.1 % [id number] Yes Apply 1 application topically 2 (two) times daily. Edrick Kins, DPM Taking Active            Med Note ([place] Dec 12, 2020  3:48 PM) Uses on toe  levothyroxine (SYNTHROID) 150 MCG tablet [id number] Yes Take 1 tablet (150 mcg total) by mouth every morning. Noreene Larsson, NP Taking Active   Omega-3 Fatty Acids (FISH OIL) 1000 MG CPDR [id number] Yes Take 1 capsule by mouth daily. [provider] Taking Active   rosuvastatin (CRESTOR) 10 MG tablet [id number] No Take 1 tablet (10 mg total) by mouth daily.  Patient not taking: Reported on 01/23/2021   Noreene Larsson, NP Not Taking Active            Med Note Currie Paris Dec 26, 2020  8:56 AM) Plans to start late November 2022 after repeat lipid panel  sildenafil (VIAGRA) 100 MG tablet [id number] Yes Take 1 tablet (100 mg total) by mouth as needed for erectile dysfunction. Perlie Mayo, NP Taking Active   TRUE METRIX BLOOD GLUCOSE TEST test strip [id number]  TEST DAILY Perlie Mayo, NP  Active   TRUEplus Lancets 33G MISC [id number]  TEST DAILY Noreene Larsson, NP  Active   UNABLE TO FIND [id number]  True Metrix meter, True  Metrix test strips, lancets, control solution, and alcohol swabs. Perlie Mayo, NP  Active             Patient Active Problem List   Diagnosis Date Noted   Insomnia 09/19/2020   Falls 05/22/2020   Annual visit for general adult medical examination with abnormal findings 02/23/2020   Elevated lipoprotein(a) 02/23/2020   Essential hypertension 10/27/2019   History of shingles 10/27/2019   Erectile dysfunction 08/20/2019   Type 2 diabetes mellitus with diabetic neuropathy, without long-term current use of insulin ([place]) 05/11/2019   Obesity (BMI 30.0-34.9) 05/11/2019   Hypothyroidism 05/11/2019   Ingrown left big toenail 05/11/2019   Asthma 08/26/2011    Immunization History  Administered Date(s) Administered   Janssen (J&J) SARS-COV-2 Vaccination 06/01/2019   Moderna Sars-Covid-2 Vaccination 01/07/2020    Conditions to be addressed/monitored: HTN, HLD, and DMII  Care Plan : Medication Management  Updates made by Beryle Lathe, [place] since 01/23/2021 12:00 AM     Problem: T2DM, HLD, HTN   Priority: High  Onset Date: 12/12/2020     Long-Range Goal: Disease Progression Prevention   Start Date: 12/12/2020  Expected End Date: 03/12/2021  Recent Progress: On track  Priority: High  Note:   Current Barriers:  Unable to independently afford treatment regimen Unable to achieve control of hyperlipidemia Suboptimal therapeutic regimen for hyperlipidemia  Pharmacist Clinical Goal(s):  Patient will verbalize ability to afford treatment regimen achieve control of hyperlipidemia as evidenced by improved LDL adhere to plan to optimize therapeutic regimen for diabetes and hyperlipidemia as evidenced by report of adherence to recommended medication management changes through collaboration with PharmD and provider.   Interventions: 1:1 collaboration with Noreene Larsson, NP regarding development and update of comprehensive plan of care as evidenced by provider attestation  and co-signature Inter-disciplinary care team collaboration (see longitudinal plan of care)  Comprehensive medication review performed; medication list updated in electronic medical record  Type 2 Diabetes - Goal on track: YES.: Controlled; Most recent A1c at goal of <7% per ADA guidelines Current medications: dulaglutide (Trulicity) 4.40 mg subcutaneously weekly (receives from [facility] - approved for Trulicity through 12/22/23) Intolerances:  metformin Taking medications as directed: yes Side effects thought to be attributed to current medication regimen: no Hypoglycemia prevention: not indicated at this time Current meal patterns:  patient reports reducing carbohydrate and fat intake Current exercise: yard work and has cardio equipment at his home On a statin: no, patient continues to decline despite counseling on the need for primary prevention On aspirin 81 mg daily: no Last microalbumin: 10; on an ACEi/ARB: no Last eye exam: unknown Last foot exam: completed within last year Pneumonia vaccine: unknown Shingles: unknown Influenza vaccine:  needs this fall Current glucose readings:  fasting: <130 which is at goal per ADA guidelines Provided extensive education on medication therapy options Encouraged regular aerobic exercise with a goal of 30 minutes five times per week (150 minutes per week) Continue Trulicity 3.66 mg subcutaneously once weekly. Can titrate dose to obtain more weight loss if desired by patient.  Hypertension - Condition stable. Not addressed this visit.: Blood pressure under good control. Blood pressure is at goal of <130/80 mmHg per 2017 AHA/ACC guidelines. Current medications: diltiazem 30 mg by mouth as needed for SVT Current home blood pressure: not discussed today Encourage dietary sodium restriction/DASH diet Recommend regular aerobic exercise Recommend home blood pressure monitoring to discuss at next visit Discussed need for and importance of continued  work on weight loss Continue current management as above Discussed vagal maneuvers to abort SVT as well  Hyperlipidemia - Goal on track: YES. Uncontrolled. LDL above goal of <70 due to very high risk given 10-year risk >20% per 2020 AACE/ACE guidelines. Triglycerides at goal of <150 per 2020 AACE/ACE guidelines. Current medications:  none Intolerances:  rosuvastatin (sluggish and drained energy) Taking medications as directed: no, patient reports he has stopped taking rosuvastatin due to adverse effects Encourage dietary reduction of high fat containing foods such as butter, nuts, bacon, egg yolks, etc. Recommend regular aerobic exercise Discussed need for and importance of continued work on weight loss Reviewed risks of hyperlipidemia, principles of treatment and consequences of untreated hyperlipidemia Re-check lipid panel in 4-12 weeks Discussed importance of statin use in diabetes for primary prevention. Patient had lipid panel drawn recently and was impressed with how much he was able to lower his LDL without the statin. He does not plan to take a statin at this time despite counseling provided to him today. He is aware of the risks of cardiovascular disease.  He is aware that statin is indicated regardless of LDL level. If LDL remains above goal at next visit, will recommend that he consider rosuvastatin 5-10 mg by mouth daily.  Patient Goals/Self-Care Activities Patient will:  Take medications as prescribed Collaborate with provider on medication access solutions Target a minimum of 150 minutes of moderate intensity exercise weekly Engage in dietary modifications by less frequent dining out, decreased fat intake, and fewer sweetened foods & beverages  Follow Up Plan: Telephone follow up appointment with care management team member scheduled for: 04/25/21      Medication Assistance:  see care plan for more details  Patient's preferred pharmacy is:  [facility], [address] [place] [address] Phone: [phone] Fax: [phone]  [facility]  Mail Delivery - [address], [address] [place] [address] Phone: [phone] Fax: [phone]   Follow Up:  Patient agrees to Care Plan and Follow-up.  Plan: Telephone follow up appointment with care management team member scheduled for:  04/25/21  Kennon Holter, PharmD, [facility] Clinical Pharmacist [facility] [phone]

## 2021-01-24 DIAGNOSIS — I10 Essential (primary) hypertension: Secondary | ICD-10-CM | POA: Diagnosis not present

## 2021-01-24 DIAGNOSIS — J4520 Mild intermittent asthma, uncomplicated: Secondary | ICD-10-CM

## 2021-01-24 DIAGNOSIS — E785 Hyperlipidemia, unspecified: Secondary | ICD-10-CM

## 2021-01-24 DIAGNOSIS — Z87891 Personal history of nicotine dependence: Secondary | ICD-10-CM | POA: Diagnosis not present

## 2021-01-24 DIAGNOSIS — E1140 Type 2 diabetes mellitus with diabetic neuropathy, unspecified: Secondary | ICD-10-CM

## 2021-01-24 DIAGNOSIS — Z7985 Long-term (current) use of injectable non-insulin antidiabetic drugs: Secondary | ICD-10-CM

## 2021-01-26 ENCOUNTER — Ambulatory Visit: Payer: Medicare HMO | Admitting: Nurse Practitioner

## 2021-01-30 ENCOUNTER — Encounter: Payer: Self-pay | Admitting: Pharmacist

## 2021-01-31 NOTE — Progress Notes (Signed)
Labs look great, and A1c is still < 6.5%.

## 2021-02-05 ENCOUNTER — Other Ambulatory Visit: Payer: Self-pay

## 2021-02-05 ENCOUNTER — Ambulatory Visit (INDEPENDENT_AMBULATORY_CARE_PROVIDER_SITE_OTHER): Payer: Medicare HMO | Admitting: Nurse Practitioner

## 2021-02-05 ENCOUNTER — Encounter: Payer: Self-pay | Admitting: Nurse Practitioner

## 2021-02-05 VITALS — BP 129/74 | HR 60 | Ht 69.0 in | Wt 211.0 lb

## 2021-02-05 DIAGNOSIS — E7841 Elevated Lipoprotein(a): Secondary | ICD-10-CM | POA: Diagnosis not present

## 2021-02-05 DIAGNOSIS — E039 Hypothyroidism, unspecified: Secondary | ICD-10-CM | POA: Diagnosis not present

## 2021-02-05 DIAGNOSIS — E7800 Pure hypercholesterolemia, unspecified: Secondary | ICD-10-CM

## 2021-02-05 DIAGNOSIS — B839 Helminthiasis, unspecified: Secondary | ICD-10-CM | POA: Diagnosis not present

## 2021-02-05 DIAGNOSIS — R3911 Hesitancy of micturition: Secondary | ICD-10-CM

## 2021-02-05 DIAGNOSIS — N529 Male erectile dysfunction, unspecified: Secondary | ICD-10-CM

## 2021-02-05 DIAGNOSIS — E1140 Type 2 diabetes mellitus with diabetic neuropathy, unspecified: Secondary | ICD-10-CM

## 2021-02-05 DIAGNOSIS — I10 Essential (primary) hypertension: Secondary | ICD-10-CM

## 2021-02-05 MED ORDER — SILDENAFIL CITRATE 100 MG PO TABS: 100.0000 mg | ORAL_TABLET | ORAL | 2 refills | Status: DC | PRN

## 2021-02-05 MED ORDER — ROSUVASTATIN CALCIUM 10 MG PO TABS: 10.0000 mg | ORAL_TABLET | Freq: Every day | ORAL | 1 refills | Status: DC

## 2021-02-05 MED ORDER — TAMSULOSIN HCL 0.4 MG PO CAPS: 0.4000 mg | ORAL_CAPSULE | Freq: Every day | ORAL | 3 refills | Status: DC

## 2021-02-05 NOTE — Assessment & Plan Note (Signed)
Lab Results  Component Value Date   CHOL 150 01/16/2021   HDL 36 (L) 01/16/2021   LDLCALC 92 01/16/2021   TRIG 120 01/16/2021   CHOLHDL 4.5 02/22/2020   -goal LDL < 70 -Rx. rosuvastatin

## 2021-02-05 NOTE — Assessment & Plan Note (Signed)
-  Rx. flomax -he also describes some frquency -if no improvement in 1 month after taking flomax, will consider myrbetriq vs urology referral -PSA was completed 3/22 and it was WNL; denies blood in urine today

## 2021-02-05 NOTE — Assessment & Plan Note (Signed)
BP Readings from Last 3 Encounters:  02/05/21 129/74  11/22/20 138/81  09/19/20 129/79   -well controlled; no med changes today

## 2021-02-05 NOTE — Progress Notes (Signed)
Acute Office Visit  Subjective:    Patient ID: Justin Klein, male    DOB: [birth year], 60 y.o.   MRN: [id number]  Chief Complaint  Patient presents with   Follow-up    Follow up from labs    HPI Patient is in today for follow-up for HLD and DM. He has been followed by pharmacy for assistance and mgmt of DM meds, and he has been recommended to take statins by myself and by pharmacy, but he has declined so far.  He states that he has a weak urine stream in the AM sometimes.  Past Medical History:  Diagnosis Date   Asthma    Asthma 08/26/2011   Bilateral leg weakness 07/21/2012   Diabetes mellitus, type 2 ([place])    Essential hypertension 10/27/2019   Hypothyroidism    Ingrown nail of great toe of left foot 05/11/2019   Numbness and tingling of both legs 07/21/2012   Pernicious anemia    PSVT (paroxysmal supraventricular tachycardia) ([place]) 08/26/2011   Pulmonary embolus ([facility])    Diagnosed 9/12 at [facility]   Pulmonary embolus ([place]) 08/26/2011   Seborrheic dermatitis    Strain of muscle, fascia and tendon of lower back, initial encounter 01/24/2020   SVT (supraventricular tachycardia) ([place])    Wart on thumb 08/20/2019    Past Surgical History:  Procedure Laterality Date   Epidermal cyst resection     Left patella tendon repair      Family History  Problem Relation Age of Onset   Diabetes type II Mother    Cancer Father    Diabetes type II Sister     Social History   Socioeconomic History   Marital status: Divorced    Spouse name: Not on file   Number of children: 3   Years of education: Not on file   Highest education level: 12th grade  Occupational History   Not on file  Tobacco Use   Smoking status: Former    Types: Cigarettes   Smokeless tobacco: Never   Tobacco comments:    2012 quit  Substance and Sexual Activity   Alcohol use: No   Drug use: No   Sexual activity: Yes  Other Topics Concern   Not on file  Social History Narrative   Lives with fiance'  Marcie Bal    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       Enjoys: music-jazz, reading-john grisham      Diet: no pork, salads daily, [place], uses sugar free sweeters, dark chocolate 70% or higher   Caffeine: decaff-sodas, ginger ale, tea-with truiva    Water: 3-4 cups       Wears seat belt   Smoke detectors at home   Does not use phone while driving    Social Determinants of Radio broadcast assistant Strain: Low Risk    Difficulty of Paying Living Expenses: Not very hard  Food Insecurity: No Food Insecurity   Worried About Charity fundraiser in the Last Year: Never true   Ran Out of Food in the Last Year: Never true  Transportation Needs: No Transportation Needs   Lack of Transportation (Medical): No   Lack of Transportation (Non-Medical): No  Physical Activity: Inactive   Days of Exercise per Week: 0 days   Minutes of Exercise per Session: 0 min  Stress: No Stress Concern Present   Feeling of Stress : Not at all  Social Connections: Moderately Isolated   Frequency of Communication with Friends and Family: More than three  times a week   Frequency of Social Gatherings with Friends and Family: Once a week   Attends Religious Services: More than 4 times per year   Active Member of [employer] or Organizations: No   Attends Music therapist: Never   Marital Status: Divorced  Human resources officer Violence: Not At Risk   Fear of Current or Ex-Partner: No   Emotionally Abused: No   Physically Abused: No   Sexually Abused: No    Outpatient Medications Prior to Visit  Medication Sig Dispense Refill   albuterol (VENTOLIN HFA) 108 (90 Base) MCG/ACT inhaler INHALE 1 OR 2 PUFFS INTO THE LUNGS EVERY SIX HOURS AS NEEDED FOR WHEEZING OR SHORTNESS OF BREATH. 8.5 g 0   Alcohol Swabs (B-D SINGLE USE SWABS REGULAR) PADS USE AS DIRECTED 100 each 0   Cholecalciferol (VITAMIN D-3) 125 MCG (5000 UT) TABS Take 5,000 Units by mouth daily.     Cyanocobalamin (B-12) 1000 MCG CAPS Take 1,000 mcg by mouth daily.      diltiazem (CARDIZEM) 30 MG tablet Take 1 tablet (30 mg total) by mouth daily. 90 tablet 2   Dulaglutide (TRULICITY) 7.49 SW/9.6PR SOPN Inject 0.75 mg into the skin once a week for 48 doses. 6 mL 3   fluticasone (CUTIVATE) 0.05 % cream APPLY TO AFFECTED AREASCON FACE UP TO TWICE DAILYTAS NEEDED FOR RASH. 60 g 3   fluticasone furoate-vilanterol (BREO ELLIPTA) 100-25 MCG/INH AEPB Inhale 1 puff into the lungs daily. 90 each 2   gentamicin cream (GARAMYCIN) 0.1 % Apply 1 application topically 2 (two) times daily. 15 g 1   levothyroxine (SYNTHROID) 150 MCG tablet Take 1 tablet (150 mcg total) by mouth every morning. 90 tablet 1   Omega-3 Fatty Acids (FISH OIL) 1000 MG CPDR Take 1 capsule by mouth daily.     TRUE METRIX BLOOD GLUCOSE TEST test strip TEST DAILY 100 strip 3   TRUEplus Lancets 33G MISC TEST DAILY 100 each 0   UNABLE TO FIND True Metrix meter, True Metrix test strips, lancets, control solution, and alcohol swabs. 1 Product 0   rosuvastatin (CRESTOR) 10 MG tablet Take 1 tablet (10 mg total) by mouth daily. 90 tablet 1   sildenafil (VIAGRA) 100 MG tablet Take 1 tablet (100 mg total) by mouth as needed for erectile dysfunction. 20 tablet 2   No facility-administered medications prior to visit.    Allergies  Allergen Reactions   Metformin And Related Other (See Comments)    Low B12, gait disturbance, myopathy, neuropathy, pernicious anemia    Crestor [Rosuvastatin] Other (See Comments)    "Made me feel sluggish and drained" Planning to retry on 12/14/20   Shellfish Allergy Nausea And Vomiting    Seafood-upset stomach    Review of Systems  Constitutional: Negative.   Respiratory: Negative.    Cardiovascular: Negative.   Genitourinary:  Positive for difficulty urinating and urgency.  Musculoskeletal: Negative.   Psychiatric/Behavioral: Negative.        Objective:    Physical Exam Constitutional:      Appearance: Normal appearance.  Cardiovascular:     Rate and Rhythm:  Normal rate and regular rhythm.     Pulses: Normal pulses.     Heart sounds: Normal heart sounds.  Pulmonary:     Effort: Pulmonary effort is normal.     Breath sounds: Normal breath sounds.  Musculoskeletal:        General: Normal range of motion.  Neurological:     Mental Status: He is  alert.  Psychiatric:        Mood and Affect: Mood normal.        Behavior: Behavior normal.        Thought Content: Thought content normal.        Judgment: Judgment normal.    BP 129/74   Pulse 60   Ht 5' 9" (1.753 m)   Wt 211 lb 0.6 oz (95.7 kg)   SpO2 95%   BMI 31.17 kg/m  Wt Readings from Last 3 Encounters:  02/05/21 211 lb 0.6 oz (95.7 kg)  11/22/20 207 lb 1.3 oz (93.9 kg)  09/19/20 209 lb (94.8 kg)    Health Maintenance Due  Topic Date Due   Pneumococcal Vaccine 55-8 Years old (1 - PCV) Never done   OPHTHALMOLOGY EXAM  Never done   Zoster Vaccines- Shingrix (1 of 2) Never done   COVID-19 Vaccine (3 - Booster for Janssen series) 03/03/2020    There are no preventive care reminders to display for this patient.   Lab Results  Component Value Date   TSH 3.890 09/19/2020   Lab Results  Component Value Date   WBC 3.9 01/16/2021   HGB 13.2 01/16/2021   HCT 40.5 01/16/2021   MCV 85 01/16/2021   PLT 206 01/16/2021   Lab Results  Component Value Date   NA 140 01/16/2021   K 4.4 01/16/2021   CO2 23 01/16/2021   GLUCOSE 115 (H) 01/16/2021   BUN 16 01/16/2021   CREATININE 1.07 01/16/2021   BILITOT 0.3 01/16/2021   ALKPHOS 85 01/16/2021   AST 17 01/16/2021   ALT 9 01/16/2021   PROT 7.3 01/16/2021   ALBUMIN 4.5 01/16/2021   CALCIUM 10.1 01/16/2021   EGFR 79 01/16/2021   Lab Results  Component Value Date   CHOL 150 01/16/2021   Lab Results  Component Value Date   HDL 36 (L) 01/16/2021   Lab Results  Component Value Date   LDLCALC 92 01/16/2021   Lab Results  Component Value Date   TRIG 120 01/16/2021   Lab Results  Component Value Date   CHOLHDL 4.5  02/22/2020   Lab Results  Component Value Date   HGBA1C 6.3 (H) 01/16/2021       Assessment & Plan:   Problem List Items Addressed This Visit       Cardiovascular and Mediastinum   Essential hypertension    BP Readings from Last 3 Encounters:  02/05/21 129/74  11/22/20 138/81  09/19/20 129/79  -well controlled; no med changes today      Relevant Medications   rosuvastatin (CRESTOR) 10 MG tablet   sildenafil (VIAGRA) 100 MG tablet   Other Relevant Orders   CBC with Differential/Platelet   CMP14+EGFR   Lipid Panel With LDL/HDL Ratio     Endocrine   Type 2 diabetes mellitus with diabetic neuropathy, without long-term current use of insulin ([place]) - Primary    Lab Results  Component Value Date   HGBA1C 6.3 (H) 01/16/2021  -doing well with trulicity      Relevant Medications   rosuvastatin (CRESTOR) 10 MG tablet   Other Relevant Orders   CBC with Differential/Platelet   CMP14+EGFR   Lipid Panel With LDL/HDL Ratio   Hemoglobin A1c   Hypothyroidism    Lab Results  Component Value Date   TSH 3.890 09/19/2020  -recheck with next labs; has been stable      Relevant Orders   TSH + free T4     Other  Erectile dysfunction    -refilled sildenafil      Relevant Medications   sildenafil (VIAGRA) 100 MG tablet   Hyperlipidemia    Lab Results  Component Value Date   CHOL 150 01/16/2021   HDL 36 (L) 01/16/2021   LDLCALC 92 01/16/2021   TRIG 120 01/16/2021   CHOLHDL 4.5 02/22/2020  -goal LDL < 70 -Rx. rosuvastatin      Relevant Medications   rosuvastatin (CRESTOR) 10 MG tablet   sildenafil (VIAGRA) 100 MG tablet   Worm    -pt states he thinks his dog has worms and he would like to be checked for parasites -check Ova and parasites -instructed him he should take dog to be treated for diagnosis, and if there is a specific parasite, we may run diagnostics or treat him for that as well      Relevant Orders   Ova and parasite examination   Urinary hesitancy     -Rx. flomax -he also describes some frquency -if no improvement in 1 month after taking flomax, will consider myrbetriq vs urology referral -PSA was completed 3/22 and it was WNL; denies blood in urine today      Relevant Medications   tamsulosin (FLOMAX) 0.4 MG CAPS capsule     Meds ordered this encounter  Medications   rosuvastatin (CRESTOR) 10 MG tablet    Sig: Take 1 tablet (10 mg total) by mouth daily.    Dispense:  90 tablet    Refill:  1   DISCONTD: sildenafil (VIAGRA) 100 MG tablet    Sig: Take 1 tablet (100 mg total) by mouth as needed for erectile dysfunction.    Dispense:  20 tablet    Refill:  2    PT TO GET 20 TABS PER MONTH   sildenafil (VIAGRA) 100 MG tablet    Sig: Take 1 tablet (100 mg total) by mouth as needed for erectile dysfunction.    Dispense:  20 tablet    Refill:  2    PT TO GET 20 TABS PER MONTH   tamsulosin (FLOMAX) 0.4 MG CAPS capsule    Sig: Take 1 capsule (0.4 mg total) by mouth daily.    Dispense:  30 capsule    Refill:  [place], NP

## 2021-02-05 NOTE — Patient Instructions (Signed)
I will be moving to [facility] located at [address], [place], [place] [address] effective Feb 25, 2021. °If you would like to establish care with [facility] please call [phone]. °

## 2021-02-05 NOTE — Assessment & Plan Note (Signed)
-  refilled sildenafil

## 2021-02-05 NOTE — Assessment & Plan Note (Signed)
-  pt states he thinks his dog has worms and he would like to be checked for parasites -check Ova and parasites -instructed him he should take dog to be treated for diagnosis, and if there is a specific parasite, we may run diagnostics or treat him for that as well

## 2021-02-05 NOTE — Assessment & Plan Note (Signed)
Lab Results  Component Value Date   TSH 3.890 09/19/2020   -recheck with next labs; has been stable

## 2021-02-05 NOTE — Assessment & Plan Note (Signed)
Lab Results  Component Value Date   HGBA1C 6.3 (H) 01/16/2021   -doing well with trulicity

## 2021-03-13 ENCOUNTER — Other Ambulatory Visit: Payer: Self-pay | Admitting: *Deleted

## 2021-03-13 MED ORDER — TRUE METRIX BLOOD GLUCOSE TEST VI STRP: ORAL_STRIP | 3 refills | Status: DC

## 2021-04-25 ENCOUNTER — Ambulatory Visit (INDEPENDENT_AMBULATORY_CARE_PROVIDER_SITE_OTHER): Payer: Medicare HMO | Admitting: Pharmacist

## 2021-04-25 DIAGNOSIS — I10 Essential (primary) hypertension: Secondary | ICD-10-CM

## 2021-04-25 DIAGNOSIS — E785 Hyperlipidemia, unspecified: Secondary | ICD-10-CM

## 2021-04-25 DIAGNOSIS — E1140 Type 2 diabetes mellitus with diabetic neuropathy, unspecified: Secondary | ICD-10-CM

## 2021-04-25 NOTE — Patient Instructions (Signed)
[name], ? ?It was great to talk to you today! ? ?Please call me with any questions or concerns. ? ?Visit Information ? ?Following are the goals we discussed today:  ? Goals Addressed   ? ?  ?  ?  ?  ? This Visit's Progress  ?  Medication Management     ?  Patient Goals/Self-Care Activities ?Patient will:  ?Take medications as prescribed ?Collaborate with provider on medication access solutions ?Target a minimum of 150 minutes of moderate intensity exercise weekly ?Engage in dietary modifications by less frequent dining out, decreased fat intake, and fewer sweetened foods & beverages ? ? ? ? ?  ? ?  ?  ? ?Follow-up plan: Next PCP appointment scheduled for: 05/16/21 ? ?Patient verbalizes understanding of instructions and care plan provided today and agrees to view in MyChart. Active MyChart status confirmed with patient.   ? ?Please call the care guide team at [phone] if you need to cancel or reschedule your appointment.  ? ?Domenic Moras, PharmD, BCACP, CPP ?Clinical Pharmacist Practitioner ?[facility] ?[phone]  ?

## 2021-04-25 NOTE — Chronic Care Management (AMB) (Signed)
Chronic Care Management Pharmacy Note  04/25/2021 Name:  Justin Klein MRN:  [id number] DOB:  1961/01/08  Summary: General: Discussed overdue health maintenance including the following: Patient asks about colonoscopy. He wants to avoid colonoscopy if possible. He did Cologard previously and may be a good candidate for Cologard again. Will have primary care provider discuss further at upcoming visit Overdue diabetic eye exam: patient reports he has an appointment with Dr. Marvel Plan later this month Vaccines: patient is overdue for Shingrix (2 doses), Tdap, and Covid booster (Bivalent) which we discussed and he is in agreement to get scheduled at out office or his pharmacy soon.  Type 2 Diabetes Controlled; Most recent A1c at goal of <7% per ADA guidelines Current medications: dulaglutide (Trulicity) 3.81 mg subcutaneously weekly (receives from [place] - approved for Trulicity through 82/99/37) Current glucose readings:  fasting: <130 which is at goal per ADA guidelines After discussion regarding patient goals including weight loss, will increase Trulicity to 1.5 mg subcutaneously once weekly x4 weeks then increase to Trulicity 3 mg subcutaneously once weekly thereafter. Since patient is enrolled in [place] patient assistance program, prescriptions for each dose were faxed today after discussing with primary care provider. Expect arrival of new doses to patient's home in a few weeks. In the meantime, patient reports he has seven more doses of Trulicity 1.69 mg at home.  Recheck A1c at next primary care provider visit in 3 weeks  Hyperlipidemia Uncontrolled. LDL above goal of <70 due to very high risk given 10-year risk >20% per 2020 AACE/ACE guidelines. Triglycerides at goal of <150 per 2020 AACE/ACE guidelines. Current medications: none, patient declines statin at this time Intolerances:  rosuvastatin (sluggish and drained energy) Reviewed risks of hyperlipidemia, principles of  treatment and consequences of untreated hyperlipidemia. Patient verbalized understanding.  Re-check lipid panel in 3 weeks at primary care provider visit Previously discussed importance of statin use in diabetes for primary prevention. Given patient's reluctance to initiation of statin therapy, discussed that a coronary artery calcium score may be appropriate. Patient in agreement. Will have primary care provider discuss further.   Subjective: [name] is an 61 y.o. year old male who is a primary patient of Lindell Spar, MD.  The CCM team was consulted for assistance with disease management and care coordination needs.    Engaged with patient by telephone for follow up visit in response to provider referral for pharmacy case management and/or care coordination services.   Consent to Services:  The patient was given information about Chronic Care Management services, agreed to services, and gave verbal consent prior to initiation of services.  Please see initial visit note for detailed documentation.   Patient Care Team: Lindell Spar, MD as PCP - General (Internal Medicine) Beryle Lathe, [facility] (Pharmacist)  Objective:  Lab Results  Component Value Date   CREATININE 1.07 01/16/2021   CREATININE 1.14 09/19/2020   CREATININE 1.12 05/24/2020    Lab Results  Component Value Date   HGBA1C 6.3 (H) 01/16/2021   Last diabetic Eye exam: No results found for: HMDIABEYEEXA  Last diabetic Foot exam: No results found for: HMDIABFOOTEX      Component Value Date/Time   CHOL 150 01/16/2021 0826   TRIG 120 01/16/2021 0826   HDL 36 (L) 01/16/2021 0826   CHOLHDL 4.5 02/22/2020 0904   LDLCALC 92 01/16/2021 0826    Hepatic Function Latest Ref Rng & Units 01/16/2021 09/19/2020 05/24/2020  Total Protein 6.0 - 8.5 g/dL 7.3  7.2 7.6  Albumin 3.8 - 4.9 g/dL 4.5 4.4 4.4  AST 0 - 40 IU/L _0 ALT 0 - 44 IU/L _1 Alk Phosphatase 44 - 121 IU/L 85 74 84  Total Bilirubin 0.0  - 1.2 mg/dL 0.3 0.3 0.4    Lab Results  Component Value Date/Time   TSH 3.890 09/19/2020 08:35 AM   TSH 3.190 05/24/2020 01:04 PM   FREET4 1.17 09/19/2020 08:35 AM   FREET4 1.16 05/24/2020 01:04 PM    CBC Latest Ref Rng & Units 01/16/2021 09/19/2020 05/24/2020  WBC 3.4 - 10.8 x10E3/uL 3.9 4.0 5.5  Hemoglobin 13.0 - 17.7 g/dL 13.2 13.2 13.5  Hematocrit 37.5 - 51.0 % 40.5 39.3 41.7  Platelets 150 - 450 x10E3/uL 206 245 286    No results found for: VD25OH  Clinical ASCVD: No  The 10-year ASCVD risk score (Arnett DK, et al., 2019) is: 24.6%   Values used to calculate the score:     Age: 61 years     Sex: Male     Is Non-Hispanic African American: Yes     Diabetic: Yes     Tobacco smoker: No     Systolic Blood Pressure: 373 mmHg     Is BP treated: Yes     HDL Cholesterol: 36 mg/dL     Total Cholesterol: 150 mg/dL    Social History   Tobacco Use  Smoking Status Former   Types: Cigarettes  Smokeless Tobacco Never  Tobacco Comments   2012 quit   BP Readings from Last 3 Encounters:  02/05/21 129/74  11/22/20 138/81  09/19/20 129/79   Pulse Readings from Last 3 Encounters:  02/05/21 60  11/22/20 60  09/19/20 61   Wt Readings from Last 3 Encounters:  02/05/21 211 lb 0.6 oz (95.7 kg)  11/22/20 207 lb 1.3 oz (93.9 kg)  09/19/20 209 lb (94.8 kg)    Assessment: Review of patient past medical history, allergies, medications, health status, including review of consultants reports, laboratory and other test data, was performed as part of comprehensive evaluation and provision of chronic care management services.   SDOH:  (Social Determinants of Health) assessments and interventions performed:    CCM Care Plan  Allergies  Allergen Reactions   Metformin And Related Other (See Comments)    Low B12, gait disturbance, myopathy, neuropathy, pernicious anemia    Crestor [Rosuvastatin] Other (See Comments)    "Made me feel sluggish and drained" Planning to retry on 12/14/20    Shellfish Allergy Nausea And Vomiting    Seafood-upset stomach    Medications Reviewed Today     Reviewed by Beryle Lathe, [facility] (Pharmacist) on 04/25/21 at [place] List Status: <None>   Medication Order Taking? Sig Documenting Provider Last Dose Status Informant  albuterol (VENTOLIN HFA) 108 (90 Base) MCG/ACT inhaler [id number] Yes INHALE 1 OR 2 PUFFS INTO THE LUNGS EVERY SIX HOURS AS NEEDED FOR WHEEZING OR SHORTNESS OF BREATH. Lindell Spar, MD Taking Active   Alcohol Swabs (B-D SINGLE USE SWABS REGULAR) PADS [id number] Yes USE AS DIRECTED Noreene Larsson, NP Taking Active   Cholecalciferol (VITAMIN D-3) 125 MCG (5000 UT) TABS [id number] Yes Take 5,000 Units by mouth daily. [provider] Taking Active   Cyanocobalamin (B-12) 1000 MCG CAPS [id number] Yes Take 1,000 mcg by mouth daily. [provider] Taking Active   diltiazem (CARDIZEM) 30 MG tablet [id number] Yes Take 1 tablet (30 mg total) by mouth daily. Jerelene Redden,  Barbee Cough, NP Taking Active            Med Note ([place] Dec 12, 2020  3:45 PM) As needed for SVT ~2x/wk  Dulaglutide (TRULICITY) 2.87 OM/7.6HM SOPN [id number] Yes Inject 0.75 mg into the skin once a week for 48 doses. Lindell Spar, MD Taking Active            Med Note Beryle Lathe   Tue Jan 16, 2021  3:24 PM)    fluticasone (CUTIVATE) 0.05 % cream [id number] Yes APPLY TO AFFECTED AREASCON FACE UP TO TWICE DAILYTAS NEEDED FOR RASH. Noreene Larsson, NP Taking Active   fluticasone furoate-vilanterol (BREO ELLIPTA) 100-25 MCG/INH AEPB [id number] Yes Inhale 1 puff into the lungs daily. Perlie Mayo, NP Taking Active            Med Note [place] Dec 12, 2020  3:44 PM) Only using as needed for asthma. Does not need to use often  gentamicin cream (GARAMYCIN) 0.1 % [id number] Yes Apply 1 application topically 2 (two) times daily. Edrick Kins, DPM Taking Active            Med Note Beryle Lathe    Tue Dec 12, 2020  3:48 PM) Uses on toe  glucose blood (TRUE METRIX BLOOD GLUCOSE TEST) test strip [id number] Yes TEST DAILY Lindell Spar, MD Taking Active   levothyroxine (SYNTHROID) 150 MCG tablet [id number] Yes Take 1 tablet (150 mcg total) by mouth every morning. Noreene Larsson, NP Taking Active   Omega-3 Fatty Acids (FISH OIL) 1000 MG CPDR [id number] Yes Take 1 capsule by mouth daily. [provider] Taking Active   sildenafil (VIAGRA) 100 MG tablet [id number] Yes Take 1 tablet (100 mg total) by mouth as needed for erectile dysfunction. Noreene Larsson, NP Taking Active   tamsulosin [facility]) 0.4 MG CAPS capsule [id number] Yes Take 1 capsule (0.4 mg total) by mouth daily. Noreene Larsson, NP Taking Active   TRUEplus Lancets 33G MISC [id number] Yes TEST DAILY Noreene Larsson, NP Taking Active   UNABLE TO FIND [id number] Yes True Metrix meter, True Metrix test strips, lancets, control solution, and alcohol swabs. Perlie Mayo, NP Taking Active             Patient Active Problem List   Diagnosis Date Noted   Worm 02/05/2021   Urinary hesitancy 02/05/2021   Insomnia 09/19/2020   Falls 05/22/2020   Annual visit for general adult medical examination with abnormal findings 02/23/2020   Hyperlipidemia 02/23/2020   Essential hypertension 10/27/2019   History of shingles 10/27/2019   Erectile dysfunction 08/20/2019   Type 2 diabetes mellitus with diabetic neuropathy, without long-term current use of insulin ([place]) 05/11/2019   Obesity (BMI 30.0-34.9) 05/11/2019   Hypothyroidism 05/11/2019   Ingrown left big toenail 05/11/2019   Asthma 08/26/2011    Immunization History  Administered Date(s) Administered   Janssen (J&J) SARS-COV-2 Vaccination 06/01/2019   Moderna Sars-Covid-2 Vaccination 01/07/2020    Conditions to be addressed/monitored: HTN, HLD, and DMII  Care Plan : Medication Management  Updates made by Beryle Lathe, [place] since 04/25/2021 12:00 AM     Problem:  T2DM, HLD, HTN   Priority: High  Onset Date: 12/12/2020     Long-Range Goal: Disease Progression Prevention   Start Date: 12/12/2020  Expected End Date: 03/12/2021  Recent Progress: On track  Priority: High  Note:   Current Barriers:  Unable to achieve control of hyperlipidemia Suboptimal therapeutic regimen for hyperlipidemia  Pharmacist Clinical Goal(s):  Through collaboration with PharmD and provider, patient will  Achieve control of hyperlipidemia as evidenced by improved LDL Adhere to plan to optimize therapeutic regimen for hyperlipidemia as evidenced by report of adherence to recommended medication management changes   Interventions: 1:1 collaboration with Lindell Spar, MD regarding development and update of comprehensive plan of care as evidenced by provider attestation and co-signature Inter-disciplinary care team collaboration (see longitudinal plan of care) Comprehensive medication review performed; medication list updated in electronic medical record  Type 2 Diabetes - Goal Met. Controlled; Most recent A1c at goal of <7% per ADA guidelines Current medications: dulaglutide (Trulicity) 9.56 mg subcutaneously weekly (receives from [place] - approved for Trulicity through 38/75/64) Intolerances:  metformin Taking medications as directed: yes Side effects thought to be attributed to current medication regimen: no Hypoglycemia prevention: not indicated at this time Current meal patterns:  patient reports reducing carbohydrate and fat intake Current exercise: yard work and has cardio equipment at his home On a statin: no, patient continues to decline despite counseling on the need for primary prevention On aspirin 81 mg daily: no Last microalbumin: 10; on an ACEi/ARB: no Last eye exam: overdue, but patient reports he is scheduled to see Dr. Marvel Plan later this month Last foot exam: completed within last year Current glucose readings:  fasting: <130 which is at goal  per ADA guidelines Encouraged regular aerobic exercise with a goal of 30 minutes five times per week (150 minutes per week) After discussion regarding patient goals including weight loss, will increase Trulicity to 1.5 mg subcutaneously once weekly x4 weeks then increase to Trulicity 3 mg subcutaneously once weekly thereafter. Since patient is enrolled in [place] patient assistance program, prescriptions for each dose were faxed today after discussing with primary care provider. Expect arrival of new doses to patient's home in a few weeks. In the meantime, patient reports he has seven more doses of Trulicity 3.32 mg at home.  Recheck A1c at next primary care provider visit in 3 weeks  Hypertension - Condition stable. Not addressed this visit.: Blood pressure under good control. Blood pressure is at goal of <130/80 mmHg per 2017 AHA/ACC guidelines. Current medications: diltiazem 30 mg by mouth as needed for SVT Current home blood pressure: not discussed today Encourage dietary sodium restriction/DASH diet Recommend regular aerobic exercise Recommend home blood pressure monitoring to discuss at next visit Discussed need for and importance of continued work on weight loss Continue current management as above Discussed vagal maneuvers to abort SVT previously   Hyperlipidemia - Goal on track: YES. Uncontrolled. LDL above goal of <70 due to very high risk given 10-year risk >20% per 2020 AACE/ACE guidelines. Triglycerides at goal of <150 per 2020 AACE/ACE guidelines. Current medications:  none, patient declines statin at this time Intolerances:  rosuvastatin (sluggish and drained energy) Taking medications as directed: n/a Encourage dietary reduction of high fat containing foods such as butter, nuts, bacon, egg yolks, etc. Recommend regular aerobic exercise Discussed need for and importance of continued work on weight loss Reviewed risks of hyperlipidemia, principles of treatment and consequences  of untreated hyperlipidemia. Patient verbalized understanding.  Re-check lipid panel in 3 weeks at primary care provider visit Previously discussed importance of statin use in diabetes for primary prevention. Given patient's reluctance to initiation of statin therapy, discussed that a coronary artery calcium score may be appropriate. Patient in agreement. Will have primary care provider discuss  further.   Patient Goals/Self-Care Activities Patient will:  Take medications as prescribed Collaborate with provider on medication access solutions Target a minimum of 150 minutes of moderate intensity exercise weekly Engage in dietary modifications by less frequent dining out, decreased fat intake, and fewer sweetened foods & beverages  Follow Up Plan: Next PCP appointment scheduled for: 05/16/21       Medication Assistance:  Trulicity obtained through [employer] medication assistance program.  Enrollment ends 02/24/22  Patient's preferred pharmacy is:  [facility], [address] [place] [place] [address] Phone: [phone] Fax: [phone]  [facility] - [place], [place] [address] Phone: [phone] Fax: [phone]  Follow Up:  Patient agrees to Care Plan and Follow-up.  Plan: Next PCP appointment scheduled for: 05/16/21  Kennon Holter, PharmD, [place], CPP Clinical Pharmacist Practitioner [facility] [phone]

## 2021-05-04 ENCOUNTER — Other Ambulatory Visit: Payer: Self-pay | Admitting: *Deleted

## 2021-05-04 DIAGNOSIS — I10 Essential (primary) hypertension: Secondary | ICD-10-CM

## 2021-05-04 MED ORDER — DILTIAZEM HCL 30 MG PO TABS: 30.0000 mg | ORAL_TABLET | Freq: Every day | ORAL | 2 refills | Status: DC

## 2021-05-07 ENCOUNTER — Ambulatory Visit: Payer: Medicare HMO | Admitting: Internal Medicine

## 2021-05-16 ENCOUNTER — Encounter: Payer: Self-pay | Admitting: Internal Medicine

## 2021-05-16 ENCOUNTER — Other Ambulatory Visit: Payer: Self-pay

## 2021-05-16 ENCOUNTER — Encounter (INDEPENDENT_AMBULATORY_CARE_PROVIDER_SITE_OTHER): Payer: Self-pay | Admitting: *Deleted

## 2021-05-16 ENCOUNTER — Ambulatory Visit (INDEPENDENT_AMBULATORY_CARE_PROVIDER_SITE_OTHER): Payer: Medicare HMO | Admitting: Internal Medicine

## 2021-05-16 ENCOUNTER — Other Ambulatory Visit: Payer: Self-pay | Admitting: Internal Medicine

## 2021-05-16 VITALS — BP 132/82 | HR 68 | Resp 18 | Ht 68.0 in | Wt 208.0 lb

## 2021-05-16 DIAGNOSIS — E782 Mixed hyperlipidemia: Secondary | ICD-10-CM | POA: Diagnosis not present

## 2021-05-16 DIAGNOSIS — I471 Supraventricular tachycardia, unspecified: Secondary | ICD-10-CM

## 2021-05-16 DIAGNOSIS — E785 Hyperlipidemia, unspecified: Secondary | ICD-10-CM

## 2021-05-16 DIAGNOSIS — Z1211 Encounter for screening for malignant neoplasm of colon: Secondary | ICD-10-CM | POA: Diagnosis not present

## 2021-05-16 DIAGNOSIS — I10 Essential (primary) hypertension: Secondary | ICD-10-CM

## 2021-05-16 DIAGNOSIS — E1140 Type 2 diabetes mellitus with diabetic neuropathy, unspecified: Secondary | ICD-10-CM | POA: Diagnosis not present

## 2021-05-16 DIAGNOSIS — E119 Type 2 diabetes mellitus without complications: Secondary | ICD-10-CM | POA: Diagnosis not present

## 2021-05-16 DIAGNOSIS — Z114 Encounter for screening for human immunodeficiency virus [HIV]: Secondary | ICD-10-CM

## 2021-05-16 DIAGNOSIS — R3911 Hesitancy of micturition: Secondary | ICD-10-CM

## 2021-05-16 DIAGNOSIS — E039 Hypothyroidism, unspecified: Secondary | ICD-10-CM | POA: Diagnosis not present

## 2021-05-16 DIAGNOSIS — H52223 Regular astigmatism, bilateral: Secondary | ICD-10-CM | POA: Diagnosis not present

## 2021-05-16 LAB — HM DIABETES EYE EXAM

## 2021-05-16 NOTE — Assessment & Plan Note (Signed)
Lab Results  ?Component Value Date  ? TSH 3.890 09/19/2020  ? ?On levothyroxine 150 mcg daily ?

## 2021-05-16 NOTE — Assessment & Plan Note (Signed)
Chronic weak urinary stream, but only at nighttime, was given tamsulosin -but did not try it ?Prefers to get urology referral, provided referral ?

## 2021-05-16 NOTE — Assessment & Plan Note (Signed)
BP Readings from Last 1 Encounters:  ?05/16/21 132/82  ? ?Well-controlled ?Counseled for compliance with the medications ?Advised DASH diet and moderate exercise/walking, at least 150 mins/week ?

## 2021-05-16 NOTE — Assessment & Plan Note (Signed)
Was given statin in the past, but felt sluggish with it ?Prefers to get coronary CT calcium score, ordered today ?

## 2021-05-16 NOTE — Patient Instructions (Addendum)
Please continue taking medications as prescribed. ? ?Please continue to follow low carb diet and ambulate as tolerated. ? ?Please consider getting Shingrix and TDaP vaccines at your local pharmacy. ?

## 2021-05-16 NOTE — Progress Notes (Signed)
? ?Established Patient Office Visit ? ?Subjective:  ?Patient ID: Justin Klein, male    DOB: 1960-12-21  Age: 61 y.o. MRN: [id number] ? ?CC:  ?Chief Complaint  ?Patient presents with  ? Follow-up  ?  3 month follow up   ? ? ?HPI ?Justin Klein is a 61 y.o. male with past medical history of type II DM, hypothyroidism, HLD and PSVT who presents for f/u of her chronic medical conditions. ? ?Type II DM: He has been taking Trulicity for it.  [facility] as well.  He denies any polyuria or polydipsia currently.  He does not like taking statin for HLD.  He asked for coronary CT for calcium scoring to decide about statin use.  He denies any chest pain or dyspnea currently. ? ?He has history of PSVT, but has not had any dizziness or palpitations recently.  He has diltiazem as needed for palpitations. ? ?He complains of nocturia and urinary hesitancy at nighttime.  He was given Flomax, but he did not try it.  He requests a urology referral for BPH evaluation.  He denies any dysuria or hematuria currently. ? ? ?Past Medical History:  ?Diagnosis Date  ? Asthma   ? Asthma 08/26/2011  ? Bilateral leg weakness 07/21/2012  ? Diabetes mellitus, type 2 (HCC)   ? Essential hypertension 10/27/2019  ? Hypothyroidism   ? Ingrown nail of great toe of left foot 05/11/2019  ? Numbness and tingling of both legs 07/21/2012  ? Pernicious anemia   ? PSVT (paroxysmal supraventricular tachycardia) (HCC) 08/26/2011  ? Pulmonary embolus (HCC)   ? Diagnosed 9/12 at [place]  ? Pulmonary embolus (HCC) 08/26/2011  ? Seborrheic dermatitis   ? Strain of muscle, fascia and tendon of lower back, initial encounter 01/24/2020  ? SVT (supraventricular tachycardia) (HCC)   ? Wart on thumb 08/20/2019  ? ? ?Past Surgical History:  ?Procedure Laterality Date  ? Epidermal cyst resection    ? Left patella tendon repair    ? ? ?Family History  ?Problem Relation Age of Onset  ? Diabetes type II Mother   ? Cancer Father   ? Diabetes type II Sister    ? ? ?Social History  ? ?Socioeconomic History  ? Marital status: Divorced  ?  Spouse name: Not on file  ? Number of children: 3  ? Years of education: Not on file  ? Highest education level: 12th grade  ?Occupational History  ? Not on file  ?Tobacco Use  ? Smoking status: Former  ?  Types: Cigarettes  ? Smokeless tobacco: Never  ? Tobacco comments:  ?  2012 quit  ?Substance and Sexual Activity  ? Alcohol use: No  ? Drug use: No  ? Sexual activity: Yes  ?Other Topics Concern  ? Not on file  ?Social History Narrative  ? Lives with fiance' Marylu LundJanet   ? 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   ?   ? Enjoys: Mercy Ridingmusic-jazz, Trecia Rogersreading-john grisham  ?   ? Diet: no pork, salads daily, Mal[place]urkey, uses sugar free sweeters, dark chocolate 70% or higher  ? Caffeine: decaff-sodas, ginger ale, tea-with truiva   ? Water: 3-4 cups   ?   ? Wears seat belt  ? Smoke detectors at home  ? Does not use phone while driving   ? ?Social Determinants of Health  ? ?Financial Resource Strain: Low Risk   ? Difficulty of Paying Living Expenses: Not very hard  ?Food Insecurity: No Food Insecurity  ? Worried About Running  Out of Food in the Last Year: Never true  ? Ran Out of Food in the Last Year: Never true  ?Transportation Needs: No Transportation Needs  ? Lack of Transportation (Medical): No  ? Lack of Transportation (Non-Medical): No  ?Physical Activity: Inactive  ? Days of Exercise per Week: 0 days  ? Minutes of Exercise per Session: 0 min  ?Stress: No Stress Concern Present  ? Feeling of Stress : Not at all  ?Social Connections: Moderately Isolated  ? Frequency of Communication with Friends and Family: More than three times a week  ? Frequency of Social Gatherings with Friends and Family: Once a week  ? Attends Religious Services: More than 4 times per year  ? Active Member of Clubs or Organizations: No  ? Attends Banker Meetings: Never  ? Marital Status: Divorced  ?Intimate Partner Violence: Not At Risk  ? Fear of Current or Ex-Partner: No  ? Emotionally  Abused: No  ? Physically Abused: No  ? Sexually Abused: No  ? ? ?Outpatient Medications Prior to Visit  ?Medication Sig Dispense Refill  ? albuterol (VENTOLIN HFA) 108 (90 Base) MCG/ACT inhaler INHALE 1 OR 2 PUFFS INTO THE LUNGS EVERY SIX HOURS AS NEEDED FOR WHEEZING OR SHORTNESS OF BREATH. 8.5 g 0  ? Alcohol Swabs (B-D SINGLE USE SWABS REGULAR) PADS USE AS DIRECTED 100 each 0  ? Cholecalciferol (VITAMIN D-3) 125 MCG (5000 UT) TABS Take 5,000 Units by mouth daily.    ? Cyanocobalamin (B-12) 1000 MCG CAPS Take 1,000 mcg by mouth daily.    ? diltiazem (CARDIZEM) 30 MG tablet Take 1 tablet (30 mg total) by mouth daily. 90 tablet 2  ? Dulaglutide (TRULICITY) 0.75 MG/0.5ML SOPN Inject 0.75 mg into the skin once a week for 48 doses. 6 mL 3  ? Dulaglutide (TRULICITY) 1.5 MG/0.5ML SOPN Inject 1.5 mg into the skin once a week.    ? Dulaglutide (TRULICITY) 3 MG/0.5ML SOPN Inject 3 mg into the skin once a week.    ? fluticasone (CUTIVATE) 0.05 % cream APPLY TO AFFECTED AREASCON FACE UP TO TWICE DAILYTAS NEEDED FOR RASH. 60 g 3  ? fluticasone furoate-vilanterol (BREO ELLIPTA) 100-25 MCG/INH AEPB Inhale 1 puff into the lungs daily. 90 each 2  ? gentamicin cream (GARAMYCIN) 0.1 % Apply 1 application topically 2 (two) times daily. 15 g 1  ? glucose blood (TRUE METRIX BLOOD GLUCOSE TEST) test strip TEST DAILY 100 strip 3  ? levothyroxine (SYNTHROID) 150 MCG tablet Take 1 tablet (150 mcg total) by mouth every morning. 90 tablet 1  ? Omega-3 Fatty Acids (FISH OIL) 1000 MG CPDR Take 1 capsule by mouth daily.    ? sildenafil (VIAGRA) 100 MG tablet Take 1 tablet (100 mg total) by mouth as needed for erectile dysfunction. 20 tablet 2  ? tamsulosin (FLOMAX) 0.4 MG CAPS capsule Take 1 capsule (0.4 mg total) by mouth daily. 30 capsule 3  ? TRUEplus Lancets 33G MISC TEST DAILY 100 each 0  ? UNABLE TO FIND True Metrix meter, True Metrix test strips, lancets, control solution, and alcohol swabs. 1 Product 0  ? ?No facility-administered  medications prior to visit.  ? ? ?Allergies  ?Allergen Reactions  ? Metformin And Related Other (See Comments)  ?  Low B12, gait disturbance, myopathy, neuropathy, pernicious anemia   ? Crestor [Rosuvastatin] Other (See Comments)  ?  "Made me feel sluggish and drained" ?Planning to retry on 12/14/20  ? Shellfish Allergy Nausea And Vomiting  ?  Seafood-upset stomach  ? ? ?ROS ?Review of Systems  ?Constitutional:  Negative for chills and fever.  ?HENT:  Negative for congestion and sore throat.   ?Eyes:  Negative for pain and discharge.  ?Respiratory:  Negative for cough and shortness of breath.   ?Cardiovascular:  Negative for chest pain and palpitations.  ?Gastrointestinal:  Negative for constipation, diarrhea, nausea and vomiting.  ?Endocrine: Negative for polydipsia and polyuria.  ?Genitourinary:  Positive for difficulty urinating (At nighttime with weak urine stream). Negative for dysuria and hematuria.  ?Musculoskeletal:  Negative for neck pain and neck stiffness.  ?Skin:  Negative for rash.  ?Neurological:  Negative for dizziness, weakness, numbness and headaches.  ?Psychiatric/Behavioral:  Negative for agitation and behavioral problems.   ? ?  ?Objective:  ?  ?Physical Exam ?Vitals reviewed.  ?Constitutional:   ?   General: He is not in acute distress. ?   Appearance: He is not diaphoretic.  ?HENT:  ?   Head: Normocephalic and atraumatic.  ?   Nose: Nose normal.  ?   Mouth/Throat:  ?   Mouth: Mucous membranes are moist.  ?Eyes:  ?   General: No scleral icterus. ?   Extraocular Movements: Extraocular movements intact.  ?Cardiovascular:  ?   Rate and Rhythm: Normal rate and regular rhythm.  ?   Pulses: Normal pulses.  ?   Heart sounds: Normal heart sounds. No murmur heard. ?Pulmonary:  ?   Breath sounds: Normal breath sounds. No wheezing or rales.  ?Musculoskeletal:  ?   Cervical back: Neck supple. No tenderness.  ?   Right lower leg: No edema.  ?   Left lower leg: No edema.  ?Skin: ?   General: Skin is warm.  ?    Findings: No rash.  ?Neurological:  ?   General: No focal deficit present.  ?   Mental Status: He is alert and oriented to person, place, and time.  ?   Sensory: No sensory deficit.  ?   Motor: No weakness.  ?Psychi

## 2021-05-16 NOTE — Assessment & Plan Note (Signed)
Lab Results  ?Component Value Date  ? HGBA1C 6.3 (H) 01/16/2021  ? ? ?On Trulicity 1.5 mg dose currently, plan to increase as tolerated ?Advised to follow diabetic diet ?Does not agree to take statin ?F/u CMP and lipid panel ?Diabetic foot exam: Today ?Diabetic eye exam: Advised to follow up with Ophthalmology for diabetic eye exam ?

## 2021-05-16 NOTE — Assessment & Plan Note (Signed)
Asymptomatic currently Has diltiazem as needed, as has not required it for a long time 

## 2021-05-17 ENCOUNTER — Encounter: Payer: Self-pay | Admitting: *Deleted

## 2021-05-17 LAB — TSH+FREE T4
Free T4: 1.85 ng/dL — ABNORMAL HIGH (ref 0.82–1.77)
TSH: 3.21 u[IU]/mL (ref 0.450–4.500)

## 2021-05-17 LAB — CBC WITH DIFFERENTIAL/PLATELET
Basophils Absolute: 0 10*3/uL (ref 0.0–0.2)
Basos: 1 %
EOS (ABSOLUTE): 0 10*3/uL (ref 0.0–0.4)
Eos: 1 %
Hematocrit: 41 % (ref 37.5–51.0)
Hemoglobin: 13.7 g/dL (ref 13.0–17.7)
Immature Grans (Abs): 0 10*3/uL (ref 0.0–0.1)
Immature Granulocytes: 0 %
Lymphocytes Absolute: 1.3 10*3/uL (ref 0.7–3.1)
Lymphs: 29 %
MCH: 28.5 pg (ref 26.6–33.0)
MCHC: 33.4 g/dL (ref 31.5–35.7)
MCV: 85 fL (ref 79–97)
Monocytes Absolute: 0.4 10*3/uL (ref 0.1–0.9)
Monocytes: 10 %
Neutrophils Absolute: 2.6 10*3/uL (ref 1.4–7.0)
Neutrophils: 59 %
Platelets: 211 10*3/uL (ref 150–450)
RBC: 4.8 x10E6/uL (ref 4.14–5.80)
RDW: 14.5 % (ref 11.6–15.4)
WBC: 4.4 10*3/uL (ref 3.4–10.8)

## 2021-05-17 LAB — LIPID PANEL WITH LDL/HDL RATIO
Cholesterol, Total: 188 mg/dL (ref 100–199)
HDL: 49 mg/dL (ref >39)
LDL Chol Calc (NIH): 126 mg/dL — ABNORMAL HIGH (ref 0–99)
LDL/HDL Ratio: 2.6 ratio (ref 0.0–3.6)
Triglycerides: 72 mg/dL (ref 0–149)
VLDL Cholesterol Cal: 13 mg/dL (ref 5–40)

## 2021-05-17 LAB — CMP14+EGFR
ALT: 8 IU/L (ref 0–44)
AST: 25 IU/L (ref 0–40)
Albumin/Globulin Ratio: 1.4 (ref 1.2–2.2)
Albumin: 4.7 g/dL (ref 3.8–4.9)
Alkaline Phosphatase: 95 IU/L (ref 44–121)
BUN/Creatinine Ratio: 13 (ref 10–24)
BUN: 15 mg/dL (ref 8–27)
Bilirubin Total: 0.6 mg/dL (ref 0.0–1.2)
CO2: 22 mmol/L (ref 20–29)
Calcium: 10.8 mg/dL — ABNORMAL HIGH (ref 8.6–10.2)
Chloride: 103 mmol/L (ref 96–106)
Creatinine, Ser: 1.14 mg/dL (ref 0.76–1.27)
Globulin, Total: 3.4 g/dL (ref 1.5–4.5)
Glucose: 94 mg/dL (ref 70–99)
Potassium: 4.5 mmol/L (ref 3.5–5.2)
Sodium: 139 mmol/L (ref 134–144)
Total Protein: 8.1 g/dL (ref 6.0–8.5)
eGFR: 74 mL/min/{1.73_m2} (ref >59)

## 2021-05-17 LAB — HEMOGLOBIN A1C
Est. average glucose Bld gHb Est-mCnc: 131 mg/dL
Hgb A1c MFr Bld: 6.2 % — ABNORMAL HIGH (ref 4.8–5.6)

## 2021-05-17 LAB — HIV ANTIBODY (ROUTINE TESTING W REFLEX): HIV Screen 4th Generation wRfx: NONREACTIVE

## 2021-05-18 LAB — MICROALBUMIN, URINE: Microalbumin, Urine: 47.8 ug/mL

## 2021-05-25 DIAGNOSIS — E785 Hyperlipidemia, unspecified: Secondary | ICD-10-CM

## 2021-05-25 DIAGNOSIS — E1140 Type 2 diabetes mellitus with diabetic neuropathy, unspecified: Secondary | ICD-10-CM

## 2021-05-25 DIAGNOSIS — I10 Essential (primary) hypertension: Secondary | ICD-10-CM

## 2021-06-01 ENCOUNTER — Other Ambulatory Visit: Payer: Self-pay | Admitting: Podiatry

## 2021-06-01 DIAGNOSIS — L600 Ingrowing nail: Secondary | ICD-10-CM

## 2021-06-05 ENCOUNTER — Ambulatory Visit (INDEPENDENT_AMBULATORY_CARE_PROVIDER_SITE_OTHER): Payer: Medicare HMO | Admitting: Internal Medicine

## 2021-06-05 ENCOUNTER — Encounter: Payer: Self-pay | Admitting: Internal Medicine

## 2021-06-05 VITALS — BP 132/82 | HR 74 | Resp 18 | Ht 68.0 in | Wt 206.0 lb

## 2021-06-05 DIAGNOSIS — K1120 Sialoadenitis, unspecified: Secondary | ICD-10-CM | POA: Diagnosis not present

## 2021-06-05 DIAGNOSIS — B839 Helminthiasis, unspecified: Secondary | ICD-10-CM | POA: Diagnosis not present

## 2021-06-05 MED ORDER — CEPHALEXIN 500 MG PO CAPS: 500.0000 mg | ORAL_CAPSULE | Freq: Four times a day (QID) | ORAL | 0 refills | Status: DC

## 2021-06-05 NOTE — Progress Notes (Signed)
? ?Acute Office Visit ? ?Subjective:  ? ? Patient ID: Justin Klein, male    DOB: [birth year], 61 y.o.   MRN: [id number] ? ?Chief Complaint  ?Patient presents with  ? Acute Visit  ?  Pt noticed a knot on neck that swells when he eats goes down at night it is tender to touch noticed on 06-02-21   ? ? ?HPI ?Patient is in today for complaint of noticing a bump over upper right neck area for the last 3 days, which is painful and increases in size after eating.  He denies any fever, chills, or sore throat.  He reports that he had URTI about 2 weeks ago, which improved with antibiotic.  He reports history of recurrent tonsillitis, which led to LAD in the neck area, which used to be b/l.  He currently denies any dysphagia or odynophagia. ? ?Past Medical History:  ?Diagnosis Date  ? Asthma   ? Asthma 08/26/2011  ? Bilateral leg weakness 07/21/2012  ? Diabetes mellitus, type 2 (HCC)   ? Essential hypertension 10/27/2019  ? Hypothyroidism   ? Ingrown nail of great toe of left foot 05/11/2019  ? Numbness and tingling of both legs 07/21/2012  ? Pernicious anemia   ? PSVT (paroxysmal supraventricular tachycardia) (HCC) 08/26/2011  ? Pulmonary embolus (HCC)   ? Diagnosed 9/12 at [place]  ? Pulmonary embolus (HCC) 08/26/2011  ? Seborrheic dermatitis   ? Strain of muscle, fascia and tendon of lower back, initial encounter 01/24/2020  ? SVT (supraventricular tachycardia) (HCC)   ? Wart on thumb 08/20/2019  ? ? ?Past Surgical History:  ?Procedure Laterality Date  ? Epidermal cyst resection    ? Left patella tendon repair    ? ? ?Family History  ?Problem Relation Age of Onset  ? Diabetes type II Mother   ? Cancer Father   ? Diabetes type II Sister   ? ? ?Social History  ? ?Socioeconomic History  ? Marital status: Divorced  ?  Spouse name: Not on file  ? Number of children: 3  ? Years of education: Not on file  ? Highest education level: 12th grade  ?Occupational History  ? Not on file  ?Tobacco Use  ? Smoking status: Former  ?  Types: Cigarettes   ? Smokeless tobacco: Never  ? Tobacco comments:  ?  2012 quit  ?Substance and Sexual Activity  ? Alcohol use: No  ? Drug use: No  ? Sexual activity: Yes  ?Other Topics Concern  ? Not on file  ?Social History Narrative  ? Lives with fiance' [name]   ? 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   ?   ? Enjoys: [place], [place]  ?   ? Diet: no pork, salads daily, [place], uses sugar free sweeters, dark chocolate 70% or higher  ? Caffeine: decaff-sodas, ginger ale, tea-with truiva   ? Water: 3-4 cups   ?   ? Wears seat belt  ? Smoke detectors at home  ? Does not use phone while driving   ? ?Social Determinants of Health  ? ?Financial Resource Strain: Low Risk   ? Difficulty of Paying Living Expenses: Not very hard  ?Food Insecurity: No Food Insecurity  ? Worried About Programme researcher, broadcasting/film/video in the Last Year: Never true  ? Ran Out of Food in the Last Year: Never true  ?Transportation Needs: No Transportation Needs  ? Lack of Transportation (Medical): No  ? Lack of Transportation (Non-Medical): No  ?Physical Activity: Inactive  ? Days of  Exercise per Week: 0 days  ? Minutes of Exercise per Session: 0 min  ?Stress: No Stress Concern Present  ? Feeling of Stress : Not at all  ?Social Connections: Moderately Isolated  ? Frequency of Communication with Friends and Family: More than three times a week  ? Frequency of Social Gatherings with Friends and Family: Once a week  ? Attends Religious Services: More than 4 times per year  ? Active Member of Clubs or Organizations: No  ? Attends Banker Meetings: Never  ? Marital Status: Divorced  ?Intimate Partner Violence: Not At Risk  ? Fear of Current or Ex-Partner: No  ? Emotionally Abused: No  ? Physically Abused: No  ? Sexually Abused: No  ? ? ?Outpatient Medications Prior to Visit  ?Medication Sig Dispense Refill  ? albuterol (VENTOLIN HFA) 108 (90 Base) MCG/ACT inhaler INHALE 1 OR 2 PUFFS INTO THE LUNGS EVERY SIX HOURS AS NEEDED FOR WHEEZING OR SHORTNESS OF BREATH. 8.5 g 0  ?  Alcohol Swabs (B-D SINGLE USE SWABS REGULAR) PADS USE AS DIRECTED 100 each 0  ? Cholecalciferol (VITAMIN D-3) 125 MCG (5000 UT) TABS Take 5,000 Units by mouth daily.    ? Cyanocobalamin (B-12) 1000 MCG CAPS Take 1,000 mcg by mouth daily.    ? diltiazem (CARDIZEM) 30 MG tablet Take 1 tablet (30 mg total) by mouth daily. 90 tablet 2  ? Dulaglutide (TRULICITY) 0.75 MG/0.5ML SOPN Inject 0.75 mg into the skin once a week for 48 doses. 6 mL 3  ? Dulaglutide (TRULICITY) 1.5 MG/0.5ML SOPN Inject 1.5 mg into the skin once a week.    ? Dulaglutide (TRULICITY) 3 MG/0.5ML SOPN Inject 3 mg into the skin once a week.    ? fluticasone (CUTIVATE) 0.05 % cream APPLY TO AFFECTED AREASCON FACE UP TO TWICE DAILYTAS NEEDED FOR RASH. 60 g 3  ? fluticasone furoate-vilanterol (BREO ELLIPTA) 100-25 MCG/INH AEPB Inhale 1 puff into the lungs daily. 90 each 2  ? gentamicin cream (GARAMYCIN) 0.1 % Apply 1 application topically 2 (two) times daily. 15 g 1  ? glucose blood (TRUE METRIX BLOOD GLUCOSE TEST) test strip TEST DAILY 100 strip 3  ? levothyroxine (SYNTHROID) 150 MCG tablet Take 1 tablet (150 mcg total) by mouth every morning. 90 tablet 1  ? Omega-3 Fatty Acids (FISH OIL) 1000 MG CPDR Take 1 capsule by mouth daily.    ? sildenafil (VIAGRA) 100 MG tablet Take 1 tablet (100 mg total) by mouth as needed for erectile dysfunction. 20 tablet 2  ? tamsulosin (FLOMAX) 0.4 MG CAPS capsule Take 1 capsule (0.4 mg total) by mouth daily. 30 capsule 3  ? TRUEplus Lancets 33G MISC TEST DAILY 100 each 0  ? UNABLE TO FIND True Metrix meter, True Metrix test strips, lancets, control solution, and alcohol swabs. 1 Product 0  ? ?No facility-administered medications prior to visit.  ? ? ?Allergies  ?Allergen Reactions  ? Metformin And Related Other (See Comments)  ?  Low B12, gait disturbance, myopathy, neuropathy, pernicious anemia   ? Crestor [Rosuvastatin] Other (See Comments)  ?  "Made me feel sluggish and drained" ?Planning to retry on 12/14/20  ?  Shellfish Allergy Nausea And Vomiting  ?  Seafood-upset stomach  ? ? ?Review of Systems  ?Constitutional:  Negative for chills and fever.  ?HENT:  Negative for congestion and sore throat.   ?     Neck bump  ?Eyes:  Negative for pain and discharge.  ?Respiratory:  Negative for cough  and shortness of breath.   ?Cardiovascular:  Negative for chest pain and palpitations.  ?Gastrointestinal:  Negative for constipation, diarrhea, nausea and vomiting.  ?Endocrine: Negative for polydipsia and polyuria.  ?Genitourinary:  Positive for difficulty urinating (At nighttime with weak urine stream). Negative for dysuria and hematuria.  ?Musculoskeletal:  Negative for neck pain and neck stiffness.  ?Skin:  Negative for rash.  ?Neurological:  Negative for dizziness, weakness, numbness and headaches.  ?Psychiatric/Behavioral:  Negative for agitation and behavioral problems.   ? ?   ?Objective:  ?  ?Physical Exam ?Vitals reviewed.  ?Constitutional:   ?   General: He is not in acute distress. ?   Appearance: He is not diaphoretic.  ?HENT:  ?   Head: Normocephalic and atraumatic.  ?   Nose: Nose normal.  ?   Mouth/Throat:  ?   Mouth: Mucous membranes are moist.  ?Eyes:  ?   General: No scleral icterus. ?   Extraocular Movements: Extraocular movements intact.  ?Neck:  ?   Comments: Tender lump in the upper neck area (submandibular) - about 1 cm in diameter ?Cardiovascular:  ?   Rate and Rhythm: Normal rate and regular rhythm.  ?   Pulses: Normal pulses.  ?   Heart sounds: Normal heart sounds. No murmur heard. ?Pulmonary:  ?   Breath sounds: Normal breath sounds. No wheezing or rales.  ?Musculoskeletal:  ?   Cervical back: Neck supple. No tenderness.  ?   Right lower leg: No edema.  ?   Left lower leg: No edema.  ?Skin: ?   General: Skin is warm.  ?   Findings: No rash.  ?Neurological:  ?   General: No focal deficit present.  ?   Mental Status: He is alert and oriented to person, place, and time.  ?   Sensory: No sensory deficit.  ?    Motor: No weakness.  ?Psychiatric:     ?   Mood and Affect: Mood normal.     ?   Behavior: Behavior normal.  ? ? ?BP 132/82 (BP Location: Left Arm, Patient Position: Sitting, Cuff Size: Normal)   Pulse 74   Res

## 2021-06-05 NOTE — Assessment & Plan Note (Signed)
Neck bump likely sialoadenitis of sublingual gland as it is painful and worse with chewing ?Started Keflex for secondary bacterial prophylaxis ?Advised to apply warm compresses ?Instructed to perform saliva massage at home, material provided ?Referred to ENT ? ?

## 2021-06-08 LAB — OVA AND PARASITE EXAMINATION

## 2021-06-22 ENCOUNTER — Encounter: Payer: Self-pay | Admitting: Internal Medicine

## 2021-06-25 ENCOUNTER — Other Ambulatory Visit: Payer: Self-pay | Admitting: *Deleted

## 2021-06-25 DIAGNOSIS — Z1211 Encounter for screening for malignant neoplasm of colon: Secondary | ICD-10-CM

## 2021-06-26 ENCOUNTER — Ambulatory Visit (HOSPITAL_COMMUNITY): Admission: RE | Admit: 2021-06-26 | Payer: Medicare HMO | Source: Ambulatory Visit

## 2021-07-02 ENCOUNTER — Telehealth: Payer: Self-pay | Admitting: Internal Medicine

## 2021-07-02 NOTE — Telephone Encounter (Signed)
[facility] called in regard to recent referral received on patient  ?[facility] unable to see patient  ? ?Call back [phone] ?

## 2021-07-03 ENCOUNTER — Encounter: Payer: Self-pay | Admitting: Internal Medicine

## 2021-07-05 ENCOUNTER — Other Ambulatory Visit: Payer: Self-pay | Admitting: *Deleted

## 2021-07-05 DIAGNOSIS — Z1211 Encounter for screening for malignant neoplasm of colon: Secondary | ICD-10-CM

## 2021-07-05 NOTE — Telephone Encounter (Signed)
New referral entered 

## 2021-07-24 ENCOUNTER — Encounter: Payer: Self-pay | Admitting: *Deleted

## 2021-08-22 ENCOUNTER — Encounter: Payer: Self-pay | Admitting: *Deleted

## 2021-08-22 NOTE — Patient Instructions (Addendum)
Referring MD/PCP: Dr. Allena Katz  Procedure: Colonoscopy  Has patient had this procedure before?  Yes, 2013  If so, when, by whom and where?    Is there a family history of colon cancer?  no  Who?  What age when diagnosed?    Is patient diabetic? If yes, Type 1 or Type 2   yes, type 2      Does patient have prosthetic heart valve or mechanical valve?  no  Do you have a pacemaker/defibrillator?  no  Has patient ever had endocarditis/atrial fibrillation? no  Does patient use oxygen? no  Has patient had joint replacement within last 12 months?  no  Is patient constipated or do they take laxatives? no  Does patient have a history of alcohol/drug use?  yes  Have you had a stroke/heart attack last 6 mths? no  Do you take medicine for weight loss?  no  Is patient on blood thinner such as Coumadin, Plavix and/or Aspirin? no  Medications:  Current Outpatient Medications on File Prior to Visit  Medication Sig Dispense Refill   albuterol (VENTOLIN HFA) 108 (90 Base) MCG/ACT inhaler INHALE 1 OR 2 PUFFS INTO THE LUNGS EVERY SIX HOURS AS NEEDED FOR WHEEZING OR SHORTNESS OF BREATH. 8.5 g 0   Alcohol Swabs (B-D SINGLE USE SWABS REGULAR) PADS USE AS DIRECTED 100 each 0   cephALEXin (KEFLEX) 500 MG capsule Take 1 capsule (500 mg total) by mouth 4 (four) times daily. 28 capsule 0   Cholecalciferol (VITAMIN D-3) 125 MCG (5000 UT) TABS Take 5,000 Units by mouth daily.     Cyanocobalamin (B-12) 1000 MCG CAPS Take 1,000 mcg by mouth daily.     diltiazem (CARDIZEM) 30 MG tablet Take 1 tablet (30 mg total) by mouth daily. 90 tablet 2   Dulaglutide (TRULICITY) 0.75 MG/0.5ML SOPN Inject 0.75 mg into the skin once a week for 48 doses. 6 mL 3   Dulaglutide (TRULICITY) 1.5 MG/0.5ML SOPN Inject 1.5 mg into the skin once a week.     Dulaglutide (TRULICITY) 3 MG/0.5ML SOPN Inject 3 mg into the skin once a week.     fluticasone (CUTIVATE) 0.05 % cream APPLY TO AFFECTED AREASCON FACE UP TO TWICE DAILYTAS NEEDED  FOR RASH. 60 g 3   fluticasone furoate-vilanterol (BREO ELLIPTA) 100-25 MCG/INH AEPB Inhale 1 puff into the lungs daily. 90 each 2   gentamicin cream (GARAMYCIN) 0.1 % Apply 1 application topically 2 (two) times daily. 15 g 1   glucose blood (TRUE METRIX BLOOD GLUCOSE TEST) test strip TEST DAILY 100 strip 3   levothyroxine (SYNTHROID) 150 MCG tablet Take 1 tablet (150 mcg total) by mouth every morning. 90 tablet 1   Omega-3 Fatty Acids (FISH OIL) 1000 MG CPDR Take 1 capsule by mouth daily.     sildenafil (VIAGRA) 100 MG tablet Take 1 tablet (100 mg total) by mouth as needed for erectile dysfunction. 20 tablet 2   tamsulosin (FLOMAX) 0.4 MG CAPS capsule Take 1 capsule (0.4 mg total) by mouth daily. 30 capsule 3   TRUEplus Lancets 33G MISC TEST DAILY 100 each 0   UNABLE TO FIND True Metrix meter, True Metrix test strips, lancets, control solution, and alcohol swabs. 1 Product 0   No current facility-administered medications on file prior to visit.     Allergies:  Allergies  Allergen Reactions   Metformin And Related Other (See Comments)    Low B12, gait disturbance, myopathy, neuropathy, pernicious anemia    Crestor [Rosuvastatin] Other (See  Comments)    "Made me feel sluggish and drained" Planning to retry on 12/14/20   Shellfish Allergy Nausea And Vomiting    Seafood-upset stomach     Estimated body mass index is 31.32 kg/m as calculated from the following:   Height as of 06/05/21: 5\' 8"  (1.727 m).   Weight as of 06/05/21: 206 lb (93.4 kg).

## 2021-08-23 NOTE — Progress Notes (Signed)
Last colonoscopy in April 2013 at [facility] with diminutive polyp in the ascending colon, benign pathology.  We need more information on alcohol/drug use.  If drinking alcohol, how much and how often.  If using drugs, what is he using?  We also need clarification on Trulicity.  There are 3 different doses of Trulicity listed.

## 2021-08-23 NOTE — Progress Notes (Signed)
Spoke with pt. He wants 15 years ago he used to smoke marijuana and drink alcohol but he has not done that since.

## 2021-08-24 ENCOUNTER — Encounter: Payer: Self-pay | Admitting: *Deleted

## 2021-08-24 MED ORDER — PEG 3350-KCL-NA BICARB-NACL 420 G PO SOLR: ORAL | 0 refills | Status: DC

## 2021-08-24 NOTE — Progress Notes (Signed)
Noted. I still need clarification on Trulicity dosing as there are 3 different doses listed on his medication list.

## 2021-08-24 NOTE — Progress Notes (Signed)
He is on 3mg  weekly

## 2021-08-24 NOTE — Addendum Note (Signed)
Addended by: Armstead Peaks on: 08/24/2021 07:40 AM   Modules accepted: Orders

## 2021-08-24 NOTE — Progress Notes (Signed)
OK to schedule.   ASA 2  No DM medications morning of procedure.

## 2021-08-24 NOTE — Progress Notes (Signed)
Spoke with pt. Scheduled for 8/3. Aware will mail instructions and send rx prep to pharmacy

## 2021-08-24 NOTE — Progress Notes (Signed)
Spoke with pt. Scheduled ofr 8/3 at 9:30am. Aware will mail instructions. Rx sent to pharmacy  Auth approved. DOS : 09/27/2021 - 12/26/2021, auth# [id number]

## 2021-09-04 ENCOUNTER — Encounter: Payer: Self-pay | Admitting: Internal Medicine

## 2021-09-05 NOTE — Telephone Encounter (Signed)
Please advise 

## 2021-09-17 ENCOUNTER — Encounter: Payer: Self-pay | Admitting: Internal Medicine

## 2021-09-17 ENCOUNTER — Ambulatory Visit (INDEPENDENT_AMBULATORY_CARE_PROVIDER_SITE_OTHER): Payer: Medicare HMO | Admitting: Internal Medicine

## 2021-09-17 VITALS — BP 122/78 | HR 63 | Resp 18 | Ht 69.0 in | Wt 202.6 lb

## 2021-09-17 DIAGNOSIS — I10 Essential (primary) hypertension: Secondary | ICD-10-CM | POA: Diagnosis not present

## 2021-09-17 DIAGNOSIS — Z125 Encounter for screening for malignant neoplasm of prostate: Secondary | ICD-10-CM | POA: Diagnosis not present

## 2021-09-17 DIAGNOSIS — E1140 Type 2 diabetes mellitus with diabetic neuropathy, unspecified: Secondary | ICD-10-CM

## 2021-09-17 DIAGNOSIS — E559 Vitamin D deficiency, unspecified: Secondary | ICD-10-CM | POA: Diagnosis not present

## 2021-09-17 DIAGNOSIS — Z0001 Encounter for general adult medical examination with abnormal findings: Secondary | ICD-10-CM | POA: Diagnosis not present

## 2021-09-17 DIAGNOSIS — E785 Hyperlipidemia, unspecified: Secondary | ICD-10-CM | POA: Diagnosis not present

## 2021-09-17 DIAGNOSIS — E039 Hypothyroidism, unspecified: Secondary | ICD-10-CM

## 2021-09-17 DIAGNOSIS — Z789 Other specified health status: Secondary | ICD-10-CM | POA: Diagnosis not present

## 2021-09-17 MED ORDER — TRULICITY 3 MG/0.5ML ~~LOC~~ SOAJ: 3.0000 mg | SUBCUTANEOUS | 2 refills | Status: DC

## 2021-09-17 MED ORDER — GLIPIZIDE ER 5 MG PO TB24: 5.0000 mg | ORAL_TABLET | Freq: Every day | ORAL | 3 refills | Status: DC

## 2021-09-17 NOTE — Patient Instructions (Addendum)
Please continue taking medications as prescribed.  Please take Glipizide until you receive Trulicity.  Please continue to follow low carb diet and ambulate as tolerated.  Please consider getting Shingrix vaccine at your local pharmacy.

## 2021-09-17 NOTE — Progress Notes (Unsigned)
Established Patient Office Visit  Subjective:  Patient ID: JESUSMANUEL ERBES, male    DOB: 09/02/1960  Age: 61 y.o. MRN: [id number]  CC:  Chief Complaint  Patient presents with   Annual Exam    Annual exam     HPI MARGARET STAGGS is a 61 y.o. male with past medical history of type II DM, hypothyroidism, HLD and PSVT who presents for annual physical.  Type II DM: He has been taking Trulicity for it, but has trouble getting refills from patient assistance program despite getting approved. He denies any polyuria or polydipsia currently.  He does not like taking statin for HLD. He denies any chest pain or dyspnea currently.  He has history of PSVT, but has not had any dizziness or palpitations recently.  He has diltiazem as needed for palpitations.   Past Medical History:  Diagnosis Date   Asthma    Asthma 08/26/2011   Bilateral leg weakness 07/21/2012   Diabetes mellitus, type 2 ([place])    Essential hypertension 10/27/2019   Hypothyroidism    Ingrown nail of great toe of left foot 05/11/2019   Numbness and tingling of both legs 07/21/2012   Pernicious anemia    PSVT (paroxysmal supraventricular tachycardia) ([place]) 08/26/2011   Pulmonary embolus (HCC)    Diagnosed 9/12 at [facility]   Pulmonary embolus ([place]) 08/26/2011   Seborrheic dermatitis    Strain of muscle, fascia and tendon of lower back, initial encounter 01/24/2020   SVT (supraventricular tachycardia) ([place])    Wart on thumb 08/20/2019    Past Surgical History:  Procedure Laterality Date   Epidermal cyst resection     Left patella tendon repair      Family History  Problem Relation Age of Onset   Diabetes type II Mother    Cancer Father    Diabetes type II Sister     Social History   Socioeconomic History   Marital status: Divorced    Spouse name: Not on file   Number of children: 3   Years of education: Not on file   Highest education level: 12th grade  Occupational History   Not on file  Tobacco Use   Smoking  status: Former    Types: Cigarettes   Smokeless tobacco: Never   Tobacco comments:    2012 quit  Substance and Sexual Activity   Alcohol use: No   Drug use: No   Sexual activity: Yes  Other Topics Concern   Not on file  Social History Narrative   Lives with fiance' Marcie Bal    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       Enjoys: music-jazz, reading-john grisham      Diet: no pork, salads daily, [place], uses sugar free sweeters, dark chocolate 70% or higher   Caffeine: decaff-sodas, ginger ale, tea-with truiva    Water: 3-4 cups       Wears seat belt   Smoke detectors at home   Does not use phone while driving    Social Determinants of Health   Financial Resource Strain: Low Risk  (11/21/2020)   Overall Financial Resource Strain (CARDIA)    Difficulty of Paying Living Expenses: Not very hard  Food Insecurity: No Food Insecurity (11/21/2020)   Hunger Vital Sign    Worried About Running Out of Food in the Last Year: Never true    Ran Out of Food in the Last Year: Never true  Transportation Needs: No Transportation Needs (11/21/2020)   PRAPARE - Transportation    Lack  of Transportation (Medical): No    Lack of Transportation (Non-Medical): No  Physical Activity: Inactive (11/21/2020)   Exercise Vital Sign    Days of Exercise per Week: 0 days    Minutes of Exercise per Session: 0 min  Stress: No Stress Concern Present (11/21/2020)   [place]    Feeling of Stress : Not at all  Social Connections: Moderately Isolated (11/21/2020)   Social Connection and Isolation Panel [NHANES]    Frequency of Communication with Friends and Family: More than three times a week    Frequency of Social Gatherings with Friends and Family: Once a week    Attends Religious Services: More than 4 times per year    Active Member of [employer] or Organizations: No    Attends Archivist Meetings: Never    Marital Status: Divorced  Human resources officer  Violence: Not At Risk (11/21/2020)   Humiliation, Afraid, Rape, and Kick questionnaire    Fear of Current or Ex-Partner: No    Emotionally Abused: No    Physically Abused: No    Sexually Abused: No    Outpatient Medications Prior to Visit  Medication Sig Dispense Refill   albuterol (VENTOLIN HFA) 108 (90 Base) MCG/ACT inhaler INHALE 1 OR 2 PUFFS INTO THE LUNGS EVERY SIX HOURS AS NEEDED FOR WHEEZING OR SHORTNESS OF BREATH. 8.5 g 0   Alcohol Swabs (B-D SINGLE USE SWABS REGULAR) PADS USE AS DIRECTED 100 each 0   Cholecalciferol (VITAMIN D-3) 125 MCG (5000 UT) TABS Take 5,000 Units by mouth daily.     Cyanocobalamin (B-12) 1000 MCG CAPS Take 1,000 mcg by mouth daily.     diltiazem (CARDIZEM) 30 MG tablet Take 1 tablet (30 mg total) by mouth daily. 90 tablet 2   fluticasone (CUTIVATE) 0.05 % cream APPLY TO AFFECTED AREASCON FACE UP TO TWICE DAILYTAS NEEDED FOR RASH. 60 g 3   fluticasone furoate-vilanterol (BREO ELLIPTA) 100-25 MCG/INH AEPB Inhale 1 puff into the lungs daily. 90 each 2   gentamicin cream (GARAMYCIN) 0.1 % Apply 1 application topically 2 (two) times daily. 15 g 1   glucose blood (TRUE METRIX BLOOD GLUCOSE TEST) test strip TEST DAILY 100 strip 3   levothyroxine (SYNTHROID) 150 MCG tablet Take 1 tablet (150 mcg total) by mouth every morning. 90 tablet 1   Omega-3 Fatty Acids (FISH OIL) 1000 MG CPDR Take 1 capsule by mouth daily.     polyethylene glycol-electrolytes (NULYTELY) 420 g solution As directed 4000 mL 0   sildenafil (VIAGRA) 100 MG tablet Take 1 tablet (100 mg total) by mouth as needed for erectile dysfunction. 20 tablet 2   tamsulosin (FLOMAX) 0.4 MG CAPS capsule Take 1 capsule (0.4 mg total) by mouth daily. 30 capsule 3   TRUEplus Lancets 33G MISC TEST DAILY 100 each 0   UNABLE TO FIND True Metrix meter, True Metrix test strips, lancets, control solution, and alcohol swabs. 1 Product 0   Dulaglutide (TRULICITY) 3 MB/8.4YK SOPN Inject 3 mg into the skin once a week.      cephALEXin (KEFLEX) 500 MG capsule Take 1 capsule (500 mg total) by mouth 4 (four) times daily. (Patient not taking: Reported on 09/17/2021) 28 capsule 0   No facility-administered medications prior to visit.    Allergies  Allergen Reactions   Metformin And Related Other (See Comments)    Low B12, gait disturbance, myopathy, neuropathy, pernicious anemia    Crestor [Rosuvastatin] Other (See Comments)    "  Made me feel sluggish and drained" Planning to retry on 12/14/20   Shellfish Allergy Nausea And Vomiting    Seafood-upset stomach    ROS Review of Systems  Constitutional:  Negative for chills and fever.  HENT:  Negative for congestion and sore throat.   Eyes:  Negative for pain and discharge.  Respiratory:  Negative for cough and shortness of breath.   Cardiovascular:  Negative for chest pain and palpitations.  Gastrointestinal:  Negative for constipation, diarrhea, nausea and vomiting.  Endocrine: Negative for polydipsia and polyuria.  Genitourinary:  Positive for difficulty urinating (At nighttime with weak urine stream). Negative for dysuria and hematuria.  Musculoskeletal:  Negative for neck pain and neck stiffness.  Skin:  Negative for rash.  Neurological:  Negative for dizziness, weakness, numbness and headaches.  Psychiatric/Behavioral:  Negative for agitation and behavioral problems.       Objective:    Physical Exam Vitals reviewed.  Constitutional:      General: He is not in acute distress.    Appearance: He is not diaphoretic.  HENT:     Head: Normocephalic and atraumatic.     Nose: Nose normal.     Mouth/Throat:     Mouth: Mucous membranes are moist.  Eyes:     General: No scleral icterus.    Extraocular Movements: Extraocular movements intact.  Cardiovascular:     Rate and Rhythm: Normal rate and regular rhythm.     Pulses: Normal pulses.     Heart sounds: Normal heart sounds. No murmur heard. Pulmonary:     Breath sounds: Normal breath sounds. No  wheezing or rales.  Abdominal:     Palpations: Abdomen is soft.     Tenderness: There is no abdominal tenderness.  Musculoskeletal:     Cervical back: Neck supple. No tenderness.     Right lower leg: No edema.     Left lower leg: No edema.  Skin:    General: Skin is warm.     Findings: No rash.  Neurological:     General: No focal deficit present.     Mental Status: He is alert and oriented to person, place, and time.     Cranial Nerves: No cranial nerve deficit.     Sensory: No sensory deficit.     Motor: No weakness.  Psychiatric:        Mood and Affect: Mood normal.        Behavior: Behavior normal.     BP 122/78 (BP Location: Right Arm, Patient Position: Sitting, Cuff Size: Normal)   Pulse 63   Resp 18   Ht '5\' 9"'  (1.753 m)   Wt 202 lb 9.6 oz (91.9 kg)   SpO2 98%   BMI 29.92 kg/m  Wt Readings from Last 3 Encounters:  09/17/21 202 lb 9.6 oz (91.9 kg)  06/05/21 206 lb (93.4 kg)  05/16/21 208 lb (94.3 kg)    Lab Results  Component Value Date   TSH 2.830 09/17/2021   Lab Results  Component Value Date   WBC 4.5 09/17/2021   HGB 13.4 09/17/2021   HCT 40.5 09/17/2021   MCV 87 09/17/2021   PLT 221 09/17/2021   Lab Results  Component Value Date   NA 137 09/17/2021   K 4.5 09/17/2021   CO2 23 09/17/2021   GLUCOSE 92 09/17/2021   BUN 16 09/17/2021   CREATININE 1.08 09/17/2021   BILITOT 0.4 09/17/2021   ALKPHOS 78 09/17/2021   AST 19 09/17/2021   ALT  11 09/17/2021   PROT 7.7 09/17/2021   ALBUMIN 4.4 09/17/2021   CALCIUM 10.7 (H) 09/17/2021   EGFR 79 09/17/2021   Lab Results  Component Value Date   CHOL 142 09/17/2021   Lab Results  Component Value Date   HDL 46 09/17/2021   Lab Results  Component Value Date   LDLCALC 83 09/17/2021   Lab Results  Component Value Date   TRIG 62 09/17/2021   Lab Results  Component Value Date   CHOLHDL 3.1 09/17/2021   Lab Results  Component Value Date   HGBA1C 5.9 (H) 09/17/2021      Assessment & Plan:    Problem List Items Addressed This Visit       Cardiovascular and Mediastinum   Essential hypertension    BP Readings from Last 1 Encounters:  09/17/21 122/78  Well-controlled Counseled for compliance with the medications Advised DASH diet and moderate exercise/walking, at least 150 mins/week      Relevant Orders   CBC with Differential/Platelet (Completed)     Endocrine   Type 2 diabetes mellitus with diabetic neuropathy, without long-term current use of insulin (HCC)    Lab Results  Component Value Date   HGBA1C 5.9 (H) 72/62/0355   On Trulicity 3 mg dose currently, refills sent to specialty pharmacy as per patient request Sent Glipizide till he gets Trulicity Advised to follow diabetic diet Does not agree to take statin F/u CMP and lipid panel Diabetic foot exam: Today Diabetic eye exam: Advised to follow up with Ophthalmology for diabetic eye exam      Relevant Medications   Dulaglutide (TRULICITY) 3 HR/4.1UL SOPN   glipiZIDE (GLUCOTROL XL) 5 MG 24 hr tablet   Other Relevant Orders   Hemoglobin A1c (Completed)   CMP14+EGFR (Completed)   CBC with Differential/Platelet (Completed)   Microalbumin / creatinine urine ratio (Completed)   Hypothyroidism    Lab Results  Component Value Date   TSH 2.830 09/17/2021  On levothyroxine 150 mcg daily      Relevant Orders   TSH + free T4 (Completed)     Other   Hyperlipidemia    Was given statin in the past, but felt sluggish with it      Relevant Orders   Lipid Profile (Completed)   Encounter for general adult medical examination with abnormal findings - Primary    Physical exam as documented. Counseling done  re healthy lifestyle involving commitment to 150 minutes exercise per week, heart healthy diet, and attaining healthy weight.The importance of adequate sleep also discussed. Changes in health habits are decided on by the patient with goals and time frames  set for achieving them. Immunization and cancer  screening needs are specifically addressed at this visit.      Statin intolerance    Felt sluggish with statins      Other Visit Diagnoses     Vitamin D deficiency       Relevant Orders   VITAMIN D 25 Hydroxy (Vit-D Deficiency, Fractures) (Completed)   Prostate cancer screening       Relevant Orders   PSA (Completed)       Meds ordered this encounter  Medications   Dulaglutide (TRULICITY) 3 AG/5.3MI SOPN    Sig: Inject 3 mg into the skin once a week.    Dispense:  6 mL    Refill:  2   glipiZIDE (GLUCOTROL XL) 5 MG 24 hr tablet    Sig: Take 1 tablet (5 mg total) by mouth  daily with breakfast.    Dispense:  30 tablet    Refill:  3    Follow-up: Return in about 4 months (around 01/18/2022) for DM.    Lindell Spar, MD

## 2021-09-19 DIAGNOSIS — Z789 Other specified health status: Secondary | ICD-10-CM | POA: Insufficient documentation

## 2021-09-19 LAB — CBC WITH DIFFERENTIAL/PLATELET
Basophils Absolute: 0 10*3/uL (ref 0.0–0.2)
Basos: 1 %
EOS (ABSOLUTE): 0 10*3/uL (ref 0.0–0.4)
Eos: 1 %
Hematocrit: 40.5 % (ref 37.5–51.0)
Hemoglobin: 13.4 g/dL (ref 13.0–17.7)
Immature Grans (Abs): 0 10*3/uL (ref 0.0–0.1)
Immature Granulocytes: 0 %
Lymphocytes Absolute: 1.3 10*3/uL (ref 0.7–3.1)
Lymphs: 29 %
MCH: 28.7 pg (ref 26.6–33.0)
MCHC: 33.1 g/dL (ref 31.5–35.7)
MCV: 87 fL (ref 79–97)
Monocytes Absolute: 0.5 10*3/uL (ref 0.1–0.9)
Monocytes: 11 %
Neutrophils Absolute: 2.6 10*3/uL (ref 1.4–7.0)
Neutrophils: 58 %
Platelets: 221 10*3/uL (ref 150–450)
RBC: 4.67 x10E6/uL (ref 4.14–5.80)
RDW: 13.6 % (ref 11.6–15.4)
WBC: 4.5 10*3/uL (ref 3.4–10.8)

## 2021-09-19 LAB — CMP14+EGFR
ALT: 11 IU/L (ref 0–44)
AST: 19 IU/L (ref 0–40)
Albumin/Globulin Ratio: 1.3 (ref 1.2–2.2)
Albumin: 4.4 g/dL (ref 3.8–4.9)
Alkaline Phosphatase: 78 IU/L (ref 44–121)
BUN/Creatinine Ratio: 15 (ref 10–24)
BUN: 16 mg/dL (ref 8–27)
Bilirubin Total: 0.4 mg/dL (ref 0.0–1.2)
CO2: 23 mmol/L (ref 20–29)
Calcium: 10.7 mg/dL — ABNORMAL HIGH (ref 8.6–10.2)
Chloride: 101 mmol/L (ref 96–106)
Creatinine, Ser: 1.08 mg/dL (ref 0.76–1.27)
Globulin, Total: 3.3 g/dL (ref 1.5–4.5)
Glucose: 92 mg/dL (ref 70–99)
Potassium: 4.5 mmol/L (ref 3.5–5.2)
Sodium: 137 mmol/L (ref 134–144)
Total Protein: 7.7 g/dL (ref 6.0–8.5)
eGFR: 79 mL/min/{1.73_m2} (ref >59)

## 2021-09-19 LAB — PSA: Prostate Specific Ag, Serum: 2.6 ng/mL (ref 0.0–4.0)

## 2021-09-19 LAB — TSH+FREE T4
Free T4: 1.39 ng/dL (ref 0.82–1.77)
TSH: 2.83 u[IU]/mL (ref 0.450–4.500)

## 2021-09-19 LAB — HEMOGLOBIN A1C
Est. average glucose Bld gHb Est-mCnc: 123 mg/dL
Hgb A1c MFr Bld: 5.9 % — ABNORMAL HIGH (ref 4.8–5.6)

## 2021-09-19 LAB — MICROALBUMIN / CREATININE URINE RATIO
Creatinine, Urine: 181.6 mg/dL
Microalb/Creat Ratio: 21 mg/g creat (ref 0–29)
Microalbumin, Urine: 38.5 ug/mL

## 2021-09-19 LAB — VITAMIN D 25 HYDROXY (VIT D DEFICIENCY, FRACTURES): Vit D, 25-Hydroxy: 36.3 ng/mL (ref 30.0–100.0)

## 2021-09-19 LAB — LIPID PANEL
Chol/HDL Ratio: 3.1 ratio (ref 0.0–5.0)
Cholesterol, Total: 142 mg/dL (ref 100–199)
HDL: 46 mg/dL (ref >39)
LDL Chol Calc (NIH): 83 mg/dL (ref 0–99)
Triglycerides: 62 mg/dL (ref 0–149)
VLDL Cholesterol Cal: 13 mg/dL (ref 5–40)

## 2021-09-19 NOTE — Assessment & Plan Note (Signed)
BP Readings from Last 1 Encounters:  09/17/21 122/78   Well-controlled Counseled for compliance with the medications Advised DASH diet and moderate exercise/walking, at least 150 mins/week

## 2021-09-19 NOTE — Assessment & Plan Note (Signed)
Was given statin in the past, but felt sluggish with it

## 2021-09-19 NOTE — Assessment & Plan Note (Signed)
Lab Results  Component Value Date   HGBA1C 5.9 (H) 09/17/2021    On Trulicity 3 mg dose currently, refills sent to specialty pharmacy as per patient request Sent Glipizide till he gets Trulicity Advised to follow diabetic diet Does not agree to take statin F/u CMP and lipid panel Diabetic foot exam: Today Diabetic eye exam: Advised to follow up with Ophthalmology for diabetic eye exam

## 2021-09-19 NOTE — Assessment & Plan Note (Signed)

## 2021-09-19 NOTE — Assessment & Plan Note (Signed)
Felt sluggish with statins

## 2021-09-19 NOTE — Assessment & Plan Note (Signed)
Lab Results  Component Value Date   TSH 2.830 09/17/2021   On levothyroxine 150 mcg daily 

## 2021-09-27 ENCOUNTER — Ambulatory Visit (HOSPITAL_COMMUNITY): Payer: Medicare HMO | Admitting: Anesthesiology

## 2021-09-27 ENCOUNTER — Other Ambulatory Visit: Payer: Self-pay

## 2021-09-27 ENCOUNTER — Ambulatory Visit (HOSPITAL_BASED_OUTPATIENT_CLINIC_OR_DEPARTMENT_OTHER): Payer: Medicare HMO | Admitting: Anesthesiology

## 2021-09-27 ENCOUNTER — Encounter (HOSPITAL_COMMUNITY): Payer: Self-pay | Admitting: Internal Medicine

## 2021-09-27 ENCOUNTER — Encounter (HOSPITAL_COMMUNITY)
Admission: RE | Disposition: A | Discharge status: Home / Self Care | Payer: Self-pay | Source: Home / Self Care | Attending: Internal Medicine

## 2021-09-27 ENCOUNTER — Ambulatory Visit (HOSPITAL_COMMUNITY)
Admission: RE | Admit: 2021-09-27 | Discharge: 2021-09-27 | Disposition: A | Discharge status: Home / Self Care | Payer: Medicare HMO | Attending: Internal Medicine | Admitting: Internal Medicine

## 2021-09-27 DIAGNOSIS — Z87891 Personal history of nicotine dependence: Secondary | ICD-10-CM

## 2021-09-27 DIAGNOSIS — K640 First degree hemorrhoids: Secondary | ICD-10-CM | POA: Diagnosis not present

## 2021-09-27 DIAGNOSIS — Z7984 Long term (current) use of oral hypoglycemic drugs: Secondary | ICD-10-CM | POA: Diagnosis not present

## 2021-09-27 DIAGNOSIS — Z7985 Long-term (current) use of injectable non-insulin antidiabetic drugs: Secondary | ICD-10-CM | POA: Insufficient documentation

## 2021-09-27 DIAGNOSIS — I10 Essential (primary) hypertension: Secondary | ICD-10-CM | POA: Insufficient documentation

## 2021-09-27 DIAGNOSIS — Z1211 Encounter for screening for malignant neoplasm of colon: Secondary | ICD-10-CM | POA: Insufficient documentation

## 2021-09-27 DIAGNOSIS — J45909 Unspecified asthma, uncomplicated: Secondary | ICD-10-CM | POA: Diagnosis not present

## 2021-09-27 DIAGNOSIS — E039 Hypothyroidism, unspecified: Secondary | ICD-10-CM | POA: Diagnosis not present

## 2021-09-27 DIAGNOSIS — E119 Type 2 diabetes mellitus without complications: Secondary | ICD-10-CM | POA: Insufficient documentation

## 2021-09-27 HISTORY — PX: COLONOSCOPY WITH PROPOFOL: SHX5780

## 2021-09-27 LAB — GLUCOSE, CAPILLARY: Glucose-Capillary: 104 mg/dL — ABNORMAL HIGH (ref 70–99)

## 2021-09-27 SURGERY — COLONOSCOPY WITH PROPOFOL
Anesthesia: General

## 2021-09-27 MED ORDER — PROPOFOL 500 MG/50ML IV EMUL
INTRAVENOUS | Status: AC
  Filled 2021-09-27: qty 50

## 2021-09-27 MED ORDER — PROPOFOL 500 MG/50ML IV EMUL
INTRAVENOUS | Status: DC | PRN
  Administered 2021-09-27: 150 ug/kg/min via INTRAVENOUS

## 2021-09-27 MED ORDER — LACTATED RINGERS IV SOLN: INTRAVENOUS | Status: DC

## 2021-09-27 MED ORDER — PHENYLEPHRINE HCL (PRESSORS) 10 MG/ML IV SOLN
INTRAVENOUS | Status: DC | PRN
  Administered 2021-09-27: 80 ug via INTRAVENOUS

## 2021-09-27 MED ORDER — PHENYLEPHRINE 80 MCG/ML (10ML) SYRINGE FOR IV PUSH (FOR BLOOD PRESSURE SUPPORT)
PREFILLED_SYRINGE | INTRAVENOUS | Status: AC
  Filled 2021-09-27: qty 10

## 2021-09-27 MED ORDER — PROPOFOL 10 MG/ML IV BOLUS
INTRAVENOUS | Status: DC | PRN
  Administered 2021-09-27: 60 mg via INTRAVENOUS

## 2021-09-27 NOTE — Discharge Instructions (Addendum)
  Colonoscopy Discharge Instructions  Read the instructions outlined below and refer to this sheet in the next few weeks. These discharge instructions provide you with general information on caring for yourself after you leave the hospital. Your doctor may also give you specific instructions. While your treatment has been planned according to the most current medical practices available, unavoidable complications occasionally occur. If you have any problems or questions after discharge, call Dr. Jena Gauss at [phone]. ACTIVITY You may resume your regular activity, but move at a slower pace for the next 24 hours.  Take frequent rest periods for the next 24 hours.  Walking will help get rid of the air and reduce the bloated feeling in your belly (abdomen).  No driving for 24 hours (because of the medicine (anesthesia) used during the test).   Do not sign any important legal documents or operate any machinery for 24 hours (because of the anesthesia used during the test).  NUTRITION Drink plenty of fluids.  You may resume your normal diet as instructed by your doctor.  Begin with a light meal and progress to your normal diet. Heavy or fried foods are harder to digest and may make you feel sick to your stomach (nauseated).  Avoid alcoholic beverages for 24 hours or as instructed.  MEDICATIONS You may resume your normal medications unless your doctor tells you otherwise.  WHAT YOU CAN EXPECT TODAY Some feelings of bloating in the abdomen.  Passage of more gas than usual.  Spotting of blood in your stool or on the toilet paper.  IF YOU HAD POLYPS REMOVED DURING THE COLONOSCOPY: No aspirin products for 7 days or as instructed.  No alcohol for 7 days or as instructed.  Eat a soft diet for the next 24 hours.  FINDING OUT THE RESULTS OF YOUR TEST Not all test results are available during your visit. If your test results are not back during the visit, make an appointment with your caregiver to find out the  results. Do not assume everything is normal if you have not heard from your caregiver or the medical facility. It is important for you to follow up on all of your test results.  SEEK IMMEDIATE MEDICAL ATTENTION IF: You have more than a spotting of blood in your stool.  Your belly is swollen (abdominal distention).  You are nauseated or vomiting.  You have a temperature over 101.  You have abdominal pain or discomfort that is severe or gets worse throughout the day.     Your colonoscopy prep was poor today.  I recommend you return in 1 year for repeat examination  At patient request, I called Ancil Boozer at [phone] -reviewed findings and recommendations  Office visit with [place] in 1 year to set up a repeat colonoscopy.

## 2021-09-27 NOTE — Anesthesia Preprocedure Evaluation (Signed)
Anesthesia Evaluation  Patient identified by MRN, date of birth, ID band Patient awake    Reviewed: Allergy & Precautions, H&P , NPO status , Patient's Chart, lab work & pertinent test results, reviewed documented beta blocker date and time   Airway Mallampati: II  TM Distance: >3 FB Neck ROM: full    Dental no notable dental hx.    Pulmonary asthma , Patient abstained from smoking., former smoker,    Pulmonary exam normal breath sounds clear to auscultation       Cardiovascular Exercise Tolerance: Good hypertension, negative cardio ROS   Rhythm:regular Rate:Normal     Neuro/Psych  Neuromuscular disease negative psych ROS   GI/Hepatic negative GI ROS, Neg liver ROS,   Endo/Other  diabetes, Type 2Hypothyroidism   Renal/GU negative Renal ROS  negative genitourinary   Musculoskeletal   Abdominal   Peds  Hematology  (+) Blood dyscrasia, anemia ,   Anesthesia Other Findings   Reproductive/Obstetrics negative OB ROS                             Anesthesia Physical Anesthesia Plan  ASA: 3  Anesthesia Plan: General   Post-op Pain Management:    Induction:   PONV Risk Score and Plan: Propofol infusion  Airway Management Planned:   Additional Equipment:   Intra-op Plan:   Post-operative Plan:   Informed Consent: I have reviewed the patients History and Physical, chart, labs and discussed the procedure including the risks, benefits and alternatives for the proposed anesthesia with the patient or authorized representative who has indicated his/her understanding and acceptance.     Dental Advisory Given  Plan Discussed with: CRNA  Anesthesia Plan Comments:         Anesthesia Quick Evaluation

## 2021-09-27 NOTE — Op Note (Signed)
[facility] Patient Name: Justin Klein Procedure Date: 09/27/2021 9:23 AM MRN: [id number] Date of Birth: Jan 31, 1961 Attending MD: Gennette Pac , MD CSN: [id number] Age: 61 Admit Type: Outpatient Procedure:                Colonoscopy Indications:              Screening for colorectal malignant neoplasm Providers:                Gennette Pac, MD, Sheran Fava, Lennice Sites, Technician, Burke Keels, Technician Referring MD:              Medicines:                Propofol per Anesthesia Complications:            No immediate complications. Estimated Blood Loss:     Estimated blood loss: none. Procedure:                Pre-Anesthesia Assessment:                           - Prior to the procedure, a History and Physical                            was performed, and patient medications and                            allergies were reviewed. The patient's tolerance of                            previous anesthesia was also reviewed. The risks                            and benefits of the procedure and the sedation                            options and risks were discussed with the patient.                            All questions were answered, and informed consent                            was obtained. Prior Anticoagulants: The patient has                            taken no previous anticoagulant or antiplatelet                            agents. ASA Grade Assessment: II - A patient with                            mild systemic disease. After reviewing the risks  and benefits, the patient was deemed in                            satisfactory condition to undergo the procedure.                           After obtaining informed consent, the colonoscope                            was passed under direct vision. Throughout the                            procedure, the patient's blood pressure, pulse, and                             oxygen saturations were monitored continuously. The                            [id number]) scope was introduced through the                            anus and advanced to the the cecum, identified by                            appendiceal orifice and ileocecal valve. The                            quality of the bowel preparation was inadequate. Scope In: 10:05:09 AM Scope Out: 10:15:35 AM Scope Withdrawal Time: 0 hours 6 minutes 24 seconds  Total Procedure Duration: 0 hours 10 minutes 26 seconds  Findings:      The perianal and digital rectal examinations were normal. Inadequate.       Quite a bit of vegetable material throughout the colon which precluded       visualization of all mucosal surfaces.      Internal hemorrhoids were found during retroflexion. Anal papilla as       well. The hemorrhoids were moderate, medium-sized and Grade I (internal       hemorrhoids that do not prolapse).      The entire examined colon appeared normal. Impression:               - Preparation of the colon was inadequate.                            Incomplete colonoscopy.                           - Internal hemorrhoids. Anal papilla.                           - The entire examined colon is normal.                           - No specimens collected. Moderate Sedation:      Moderate (conscious) sedation was personally administered by an       anesthesia  professional. The following parameters were monitored: oxygen       saturation, heart rate, blood pressure, respiratory rate, EKG, adequacy       of pulmonary ventilation, and response to care. Recommendation:           - Patient has a contact number available for                            emergencies. The signs and symptoms of potential                            delayed complications were discussed with the                            patient. Return to normal activities tomorrow.                            Written  discharge instructions were provided to the                            patient.                           - Advance diet as tolerated.                           - Repeat colonoscopy in 1 year for screening                            purposes.                           - Return to GI office (date not yet determined). Procedure Code(s):        --- Professional ---                           [phone], Colonoscopy, flexible; diagnostic, including                            collection of specimen(s) by brushing or washing,                            when performed (separate procedure) Diagnosis Code(s):        --- Professional ---                           Z12.11, Encounter for screening for malignant                            neoplasm of colon                           K64.0, First degree hemorrhoids CPT copyright 2019 American Medical Association. All rights reserved. The codes documented in this report are preliminary and upon coder review may  be revised to meet current compliance requirements. [name]. Janica Eldred, MD Gennette Pac, MD 09/27/2021 10:23:29 AM This report  has been signed electronically. Number of Addenda: 0

## 2021-09-27 NOTE — H&P (Signed)
@LOGO @   Primary Care Physician:  , MD Primary Gastroenterologist:  Dr. Anabel Halon  Pre-Procedure History & Physical: HPI:  Justin Klein is a 61 y.o. male is here for a screening colonoscopy.  Bowel symptoms.  Negative colonoscopy 10 years ago.  No family history of colon cancer.  Past Medical History:  Diagnosis Date   Asthma    Asthma 08/26/2011   Bilateral leg weakness 07/21/2012   Diabetes mellitus, type 2 (HCC)    Essential hypertension 10/27/2019   Hypothyroidism    Ingrown nail of great toe of left foot 05/11/2019   Numbness and tingling of both legs 07/21/2012   Pernicious anemia    PSVT (paroxysmal supraventricular tachycardia) (HCC) 08/26/2011   Pulmonary embolus (HCC)    Diagnosed 9/12 at [facility]   Pulmonary embolus (HCC) 08/26/2011   Seborrheic dermatitis    Strain of muscle, fascia and tendon of lower back, initial encounter 01/24/2020   SVT (supraventricular tachycardia) (HCC)    Wart on thumb 08/20/2019    Past Surgical History:  Procedure Laterality Date   Epidermal cyst resection     Left patella tendon repair      Prior to Admission medications   Medication Sig Start Date End Date Taking? Authorizing Provider  albuterol (VENTOLIN HFA) 108 (90 Base) MCG/ACT inhaler INHALE 1 OR 2 PUFFS INTO THE LUNGS EVERY SIX HOURS AS NEEDED FOR WHEEZING OR SHORTNESS OF BREATH. 01/16/21  Yes 01/18/21, MD  Cholecalciferol (VITAMIN D-3) 25 MCG (1000 UT) CAPS Take 1,000 Units by mouth daily.   Yes [provider]  Cyanocobalamin (B-12) 1000 MCG CAPS Take 1,000 mcg by mouth daily.   Yes [provider]  Dulaglutide (TRULICITY) 3 MG/0.5ML SOPN Inject 3 mg into the skin once a week. Patient taking differently: Inject 3 mg into the skin every Monday. 09/17/21  Yes Patel, Rutwik K, MD  fluticasone (CUTIVATE) 0.05 % cream APPLY TO AFFECTED AREASCON FACE UP TO TWICE DAILYTAS NEEDED FOR RASH. Patient taking differently: Apply 1 Application topically daily.  APPLY TO AFFECTED AREAS ON FACE FOR RASH. 01/04/21  Yes 13/10/22, NP  fluticasone furoate-vilanterol (BREO ELLIPTA) 100-25 MCG/INH AEPB Inhale 1 puff into the lungs daily. Patient taking differently: Inhale 1 puff into the lungs daily as needed (asthma). 03/15/20  Yes 03/17/20, NP  levothyroxine (SYNTHROID) 150 MCG tablet Take 1 tablet (150 mcg total) by mouth every morning. 11/28/20  Yes 01/28/21, NP  Multiple Vitamins-Minerals (MULTIVITAMIN WITH MINERALS) tablet Take 1 tablet by mouth daily.   Yes [provider]  Omega-3 Fatty Acids (FISH OIL) 1000 MG CPDR Take 1,000 mg by mouth daily.   Yes [provider]  polyethylene glycol-electrolytes (NULYTELY) 420 g solution As directed 08/24/21  Yes Jibran Crookshanks, 08/26/21, MD  sildenafil (VIAGRA) 100 MG tablet Take 1 tablet (100 mg total) by mouth as needed for erectile dysfunction. 02/05/21  Yes 14/12/22, NP  Alcohol Swabs (B-D SINGLE USE SWABS REGULAR) PADS USE AS DIRECTED 07/03/20   09/02/20, NP  diltiazem (CARDIZEM) 30 MG tablet Take 1 tablet (30 mg total) by mouth daily. Patient taking differently: Take 30 mg by mouth daily as needed (SVT). 05/04/21   07/04/21, MD  glipiZIDE (GLUCOTROL XL) 5 MG 24 hr tablet Take 1 tablet (5 mg total) by mouth daily with breakfast. Patient not taking: Reported on 09/20/2021 09/17/21   09/19/21, MD  glucose blood (TRUE METRIX BLOOD GLUCOSE TEST)  test strip TEST DAILY 03/13/21   Anabel Halon, MD  tamsulosin (FLOMAX) 0.4 MG CAPS capsule Take 1 capsule (0.4 mg total) by mouth daily. 02/05/21   Heather Roberts, NP  TRUEplus Lancets 33G MISC TEST DAILY 06/12/20   Heather Roberts, NP  UNABLE TO FIND True Metrix meter, True Metrix test strips, lancets, control solution, and alcohol swabs. 03/22/20   Freddy Finner, NP    Allergies as of 08/24/2021 - Review Complete 06/05/2021  Allergen Reaction Noted   Metformin and related Other (See Comments) 12/12/2020   Crestor  [rosuvastatin] Other (See Comments) 12/12/2020   Shellfish allergy Nausea And Vomiting 05/07/2019    Family History  Problem Relation Age of Onset   Diabetes type II Mother    Cancer Father    Diabetes type II Sister     Social History   Socioeconomic History   Marital status: Divorced    Spouse name: Not on file   Number of children: 3   Years of education: Not on file   Highest education level: 12th grade  Occupational History   Not on file  Tobacco Use   Smoking status: Former    Types: Cigarettes   Smokeless tobacco: Never   Tobacco comments:    2012 quit  Substance and Sexual Activity   Alcohol use: No   Drug use: No   Sexual activity: Yes  Other Topics Concern   Not on file  Social History Narrative   Lives with fiance' [name]    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       Enjoys: music-jazz, reading-john grisham      Diet: no pork, salads daily, [place], uses sugar free sweeters, dark chocolate 70% or higher   Caffeine: decaff-sodas, ginger ale, tea-with truiva    Water: 3-4 cups       Wears seat belt   Smoke detectors at home   Does not use phone while driving    Social Determinants of Health   Financial Resource Strain: Low Risk  (11/21/2020)   Overall Financial Resource Strain (CARDIA)    Difficulty of Paying Living Expenses: Not very hard  Food Insecurity: No Food Insecurity (11/21/2020)   Hunger Vital Sign    Worried About Running Out of Food in the Last Year: Never true    Ran Out of Food in the Last Year: Never true  Transportation Needs: No Transportation Needs (11/21/2020)   PRAPARE - Administrator, Civil Service (Medical): No    Lack of Transportation (Non-Medical): No  Physical Activity: Inactive (11/21/2020)   Exercise Vital Sign    Days of Exercise per Week: 0 days    Minutes of Exercise per Session: 0 min  Stress: No Stress Concern Present (11/21/2020)   [employer] of Occupational Health - Occupational Stress Questionnaire    Feeling of  Stress : Not at all  Social Connections: Moderately Isolated (11/21/2020)   Social Connection and Isolation Panel [NHANES]    Frequency of Communication with Friends and Family: More than three times a week    Frequency of Social Gatherings with Friends and Family: Once a week    Attends Religious Services: More than 4 times per year    Active Member of [employer] or Organizations: No    Attends Banker Meetings: Never    Marital Status: Divorced  Catering manager Violence: Not At Risk (11/21/2020)   Humiliation, Afraid, Rape, and Kick questionnaire    Fear of Current or Ex-Partner: No  Emotionally Abused: No    Physically Abused: No    Sexually Abused: No    Review of Systems: See HPI, otherwise negative ROS  Physical Exam: BP 128/67   Temp 98.5 F (36.9 C) (Oral)   Resp 16   Ht 5\' 9"  (1.753 m)   Wt 89.4 kg   SpO2 100%   BMI 29.09 kg/m  General:   Alert,  Well-developed, well-nourished, pleasant and cooperative in NAD Lungs:  Clear throughout to auscultation.   No wheezes, crackles, or rhonchi. No acute distress. Heart:  Regular rate and rhythm; no murmurs, clicks, rubs,  or gallops. Abdomen:  Soft, nontender and nondistended. No masses, hepatosplenomegaly or hernias noted. Normal bowel sounds, without guarding, and without rebound.   Impression/Plan: Justin Klein is now here to undergo a screening colonoscopy.  Average risk screening examination.  Risks, benefits, limitations, imponderables and alternatives regarding colonoscopy have been reviewed with the patient. Questions have been answered. All parties agreeable.     Notice:  This dictation was prepared with Dragon dictation along with smaller phrase technology. Any transcriptional errors that result from this process are unintentional and may not be corrected upon review.

## 2021-09-27 NOTE — Transfer of Care (Signed)
Immediate Anesthesia Transfer of Care Note  Patient: Justin Klein  Procedure(s) Performed: COLONOSCOPY WITH PROPOFOL  Patient Location: PACU  Anesthesia Type:General  Level of Consciousness: awake, alert  and oriented  Airway & Oxygen Therapy: Patient Spontanous Breathing  Post-op Assessment: Report given to RN, Post -op Vital signs reviewed and stable, Patient moving all extremities X 4 and Patient able to stick tongue midline  Post vital signs: Reviewed  Last Vitals:  Vitals Value Taken Time  BP 115/59   Temp 97.9   Pulse 56   Resp 13   SpO2 96     Last Pain:  Vitals:   09/27/21 0818  TempSrc: Oral  PainSc: 0-No pain      Patients Stated Pain Goal: 8 (09/27/21 0818)  Complications: No notable events documented.

## 2021-09-28 NOTE — Anesthesia Postprocedure Evaluation (Signed)
Anesthesia Post Note  Patient: Justin Klein  Procedure(s) Performed: COLONOSCOPY WITH PROPOFOL  Patient location during evaluation: Phase II Anesthesia Type: General Level of consciousness: awake Pain management: pain level controlled Vital Signs Assessment: post-procedure vital signs reviewed and stable Respiratory status: spontaneous breathing and respiratory function stable Cardiovascular status: blood pressure returned to baseline and stable Postop Assessment: no headache and no apparent nausea or vomiting Anesthetic complications: no Comments: Late entry   No notable events documented.   Last Vitals:  Vitals:   09/27/21 1021 09/27/21 1026  BP: (!) 115/59 (!) 91/51  Resp: 13 14  Temp: 36.6 C   SpO2: 95% 96%    Last Pain:  Vitals:   09/27/21 1026  TempSrc:   PainSc: 0-No pain                 Windell Norfolk

## 2021-10-01 ENCOUNTER — Other Ambulatory Visit: Payer: Self-pay | Admitting: *Deleted

## 2021-10-01 ENCOUNTER — Encounter: Payer: Self-pay | Admitting: Internal Medicine

## 2021-10-01 DIAGNOSIS — E039 Hypothyroidism, unspecified: Secondary | ICD-10-CM

## 2021-10-01 MED ORDER — LEVOTHYROXINE SODIUM 150 MCG PO TABS: 150.0000 ug | ORAL_TABLET | Freq: Every morning | ORAL | 1 refills | Status: DC

## 2021-10-03 ENCOUNTER — Encounter (HOSPITAL_COMMUNITY): Payer: Self-pay | Admitting: Internal Medicine

## 2021-10-19 ENCOUNTER — Other Ambulatory Visit: Payer: Self-pay | Admitting: Internal Medicine

## 2021-10-19 DIAGNOSIS — N529 Male erectile dysfunction, unspecified: Secondary | ICD-10-CM

## 2021-11-07 ENCOUNTER — Other Ambulatory Visit: Payer: Self-pay | Admitting: Internal Medicine

## 2021-11-07 DIAGNOSIS — I10 Essential (primary) hypertension: Secondary | ICD-10-CM

## 2021-11-22 ENCOUNTER — Ambulatory Visit: Payer: Medicare HMO

## 2021-11-23 ENCOUNTER — Other Ambulatory Visit: Payer: Self-pay | Admitting: Internal Medicine

## 2021-11-23 ENCOUNTER — Encounter: Payer: Self-pay | Admitting: Internal Medicine

## 2021-11-23 DIAGNOSIS — N529 Male erectile dysfunction, unspecified: Secondary | ICD-10-CM

## 2021-11-23 MED ORDER — SILDENAFIL CITRATE 100 MG PO TABS: 100.0000 mg | ORAL_TABLET | ORAL | 1 refills | Status: DC | PRN

## 2021-12-11 ENCOUNTER — Encounter: Payer: Self-pay | Admitting: Internal Medicine

## 2021-12-11 ENCOUNTER — Ambulatory Visit (INDEPENDENT_AMBULATORY_CARE_PROVIDER_SITE_OTHER): Payer: Medicare HMO | Admitting: Internal Medicine

## 2021-12-11 DIAGNOSIS — Z0000 Encounter for general adult medical examination without abnormal findings: Secondary | ICD-10-CM

## 2021-12-11 NOTE — Patient Instructions (Signed)
  Mr. Justin Klein , Thank you for taking time to come for your Medicare Wellness Visit. I appreciate your ongoing commitment to your health goals. Please review the following plan we discussed and let me know if I can assist you in the future.   These are the goals we discussed: Continue weight loss goals.    This is a list of the screening recommended for you and due dates:  Health Maintenance  Topic Date Due   Tetanus Vaccine  Never done   Zoster (Shingles) Vaccine (1 of 2) Never done   COVID-19 Vaccine (3 - Booster for Janssen series) 03/03/2020   Flu Shot  Never done   Hemoglobin A1C  03/20/2022   Eye exam for diabetics  05/17/2022   Yearly kidney function blood test for diabetes  09/18/2022   Yearly kidney health urinalysis for diabetes  09/18/2022   Complete foot exam   09/18/2022   Colon Cancer Screening  09/28/2031   Hepatitis C Screening: USPSTF Recommendation to screen - Ages 18-79 yo.  Completed   HIV Screening  Completed   HPV Vaccine  Aged Out

## 2021-12-11 NOTE — Progress Notes (Signed)
Subjective:  This is a telephone encounter between Justin Klein and Justin Klein on 12/11/2021 for AWV. The visit was conducted with the patient located at home and Justin Klein at [facility]. The patient's identity was confirmed using their DOB and current address. The patient has consented to being evaluated through a telephone encounter and understands the associated risks (an examination cannot be done and the patient may need to come in for an appointment) / benefits (allows the patient to remain at home, decreasing exposure to coronavirus).    Justin Klein is a 61 y.o. male who presents for Medicare Annual/Subsequent preventive examination.  Review of Systems    Review of Systems  All other systems reviewed and are negative.   Objective:    There were no vitals filed for this visit. There is no height or weight on file to calculate BMI.     09/27/2021    8:12 AM 11/21/2020    1:27 PM  Advanced Directives  Does Patient Have a Medical Advance Directive? No No  Would patient like information on creating a medical advance directive? No - Patient declined     Current Medications (verified) Outpatient Encounter Medications as of 12/11/2021  Medication Sig  . albuterol (VENTOLIN HFA) 108 (90 Base) MCG/ACT inhaler INHALE 1 OR 2 PUFFS INTO THE LUNGS EVERY SIX HOURS AS NEEDED FOR WHEEZING OR SHORTNESS OF BREATH.  Marland Kitchen Alcohol Swabs (B-D SINGLE USE SWABS REGULAR) PADS USE AS DIRECTED  . Cholecalciferol (VITAMIN D-3) 25 MCG (1000 UT) CAPS Take 1,000 Units by mouth daily.  . Cyanocobalamin (B-12) 1000 MCG CAPS Take 1,000 mcg by mouth daily.  Marland Kitchen diltiazem (CARDIZEM) 30 MG tablet TAKE 1 TABLET (30 MG TOTAL) BY MOUTH DAILY.  . Dulaglutide (TRULICITY) 3 MG/0.5ML SOPN Inject 3 mg into the skin once a week. (Patient taking differently: Inject 3 mg into the skin every Monday.)  . fluticasone (CUTIVATE) 0.05 % cream APPLY TO AFFECTED AREASCON FACE UP TO TWICE DAILYTAS NEEDED FOR RASH. (Patient  taking differently: Apply 1 Application topically daily. APPLY TO AFFECTED AREAS ON FACE FOR RASH.)  . fluticasone furoate-vilanterol (BREO ELLIPTA) 100-25 MCG/INH AEPB Inhale 1 puff into the lungs daily. (Patient taking differently: Inhale 1 puff into the lungs daily as needed (asthma).)  . glucose blood (TRUE METRIX BLOOD GLUCOSE TEST) test strip TEST DAILY  . levothyroxine (SYNTHROID) 150 MCG tablet Take 1 tablet (150 mcg total) by mouth every morning.  . Multiple Vitamins-Minerals (MULTIVITAMIN WITH MINERALS) tablet Take 1 tablet by mouth daily.  . Omega-3 Fatty Acids (FISH OIL) 1000 MG CPDR Take 1,000 mg by mouth daily.  . polyethylene glycol-electrolytes (NULYTELY) 420 g solution As directed  . rosuvastatin (CRESTOR) 10 MG tablet Take 10 mg by mouth at bedtime.  . sildenafil (VIAGRA) 100 MG tablet Take 1 tablet (100 mg total) by mouth as needed for erectile dysfunction.  . tamsulosin (FLOMAX) 0.4 MG CAPS capsule Take 1 capsule (0.4 mg total) by mouth daily.  . TRUEplus Lancets 33G MISC TEST DAILY  . UNABLE TO FIND True Metrix meter, True Metrix test strips, lancets, control solution, and alcohol swabs.  . [DISCONTINUED] glipiZIDE (GLUCOTROL XL) 5 MG 24 hr tablet Take 1 tablet (5 mg total) by mouth daily with breakfast. (Patient not taking: Reported on 09/20/2021)   No facility-administered encounter medications on file as of 12/11/2021.    Allergies (verified) Metformin and related, Crestor [rosuvastatin], and Shellfish allergy   History: Past Medical History:  Diagnosis Date  .  Asthma   . Asthma 08/26/2011  . Bilateral leg weakness 07/21/2012  . Diabetes mellitus, type 2 (HCC)   . Essential hypertension 10/27/2019  . Hypothyroidism   . Ingrown nail of great toe of left foot 05/11/2019  . Numbness and tingling of both legs 07/21/2012  . Pernicious anemia   . PSVT (paroxysmal supraventricular tachycardia) (HCC) 08/26/2011  . Pulmonary embolus (HCC)    Diagnosed 9/12 at [facility]  .  Pulmonary embolus (HCC) 08/26/2011  . Seborrheic dermatitis   . Strain of muscle, fascia and tendon of lower back, initial encounter 01/24/2020  . SVT (supraventricular tachycardia) (HCC)   . Wart on thumb 08/20/2019   Past Surgical History:  Procedure Laterality Date  . COLONOSCOPY WITH PROPOFOL N/A 09/27/2021   Procedure: COLONOSCOPY WITH PROPOFOL;  Surgeon: Corbin Ade, MD;  Location: [facility];  Service: Endoscopy;  Laterality: N/A;  9:30am  . Epidermal cyst resection    . Left patella tendon repair     Family History  Problem Relation Age of Onset  . Diabetes type II Mother   . Cancer Father   . Diabetes type II Sister    Social History   Socioeconomic History  . Marital status: Divorced    Spouse name: Not on file  . Number of children: 3  . Years of education: Not on file  . Highest education level: 12th grade  Occupational History  . Not on file  Tobacco Use  . Smoking status: Former    Types: Cigarettes  . Smokeless tobacco: Never  . Tobacco comments:    2012 quit  Substance and Sexual Activity  . Alcohol use: No  . Drug use: No  . Sexual activity: Yes  Other Topics Concern  . Not on file  Social History Narrative   Lives with fiance' [name]    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       Enjoys: music-jazz, Trecia Rogers      Diet: no pork, salads daily, [place], uses sugar free sweeters, dark chocolate 70% or higher   Caffeine: decaff-sodas, ginger ale, tea-with truiva    Water: 3-4 cups       Wears seat belt   Smoke detectors at home   Does not use phone while driving    Social Determinants of Health   Financial Resource Strain: Low Risk  (11/21/2020)   Overall Financial Resource Strain (CARDIA)   . Difficulty of Paying Living Expenses: Not very hard  Food Insecurity: No Food Insecurity (11/21/2020)   Hunger Vital Sign   . Worried About Programme researcher, broadcasting/film/video in the Last Year: Never true   . Ran Out of Food in the Last Year: Never true  Transportation  Needs: No Transportation Needs (11/21/2020)   PRAPARE - Transportation   . Lack of Transportation (Medical): No   . Lack of Transportation (Non-Medical): No  Physical Activity: Inactive (11/21/2020)   Exercise Vital Sign   . Days of Exercise per Week: 0 days   . Minutes of Exercise per Session: 0 min  Stress: No Stress Concern Present (11/21/2020)   [employer] of Occupational Health - Occupational Stress Questionnaire   . Feeling of Stress : Not at all  Social Connections: Moderately Isolated (11/21/2020)   Social Connection and Isolation Panel [NHANES]   . Frequency of Communication with Friends and Family: More than three times a week   . Frequency of Social Gatherings with Friends and Family: Once a week   . Attends Religious Services: More than 4  times per year   . Active Member of Clubs or Organizations: No   . Attends Archivist Meetings: Never   . Marital Status: Divorced    Tobacco Counseling Counseling given: Not Answered Tobacco comments: 2012 quit   Clinical Intake:  Pre-visit preparation completed: Yes  Pain : No/denies pain     Diabetes: Yes CBG done?: No Did pt. bring in CBG monitor from home?: No  How often do you need to have someone help you when you read instructions, pamphlets, or other written materials from your doctor or pharmacy?: 5 - Always What is the last grade level you completed in school?: 12 grade  Diabetic?Yes   Activities of Daily Living     No data to display          Patient Care Team: Lindell Spar, MD as PCP - General (Internal Medicine) Beryle Lathe, [facility] (Inactive) (Pharmacist)  Indicate any recent Medical Services you may have received from other than [facility] providers in the past year (date may be approximate).     Assessment:   This is a routine wellness examination for [place].  Hearing/Vision screen No results found.  Dietary issues and exercise activities discussed:     Goals Addressed    None    Depression Screen    09/17/2021    8:36 AM 06/05/2021   10:11 AM 05/16/2021    8:46 AM 02/05/2021    8:10 AM 11/22/2020    8:11 AM 11/21/2020    1:23 PM 09/19/2020    8:14 AM  PHQ 2/9 Scores  PHQ - 2 Score 0 0 0 0 0 0 0    Fall Risk    09/17/2021    8:36 AM 06/05/2021   10:11 AM 05/16/2021    8:46 AM 02/05/2021    8:10 AM 11/22/2020    8:11 AM  Fall Risk   Falls in the past year? 1 0 1 0 1  Number falls in past yr: 1 0 1 0 1  Injury with Fall? 0 0 0 0 0  Risk for fall due to : History of fall(s);Impaired balance/gait No Fall Risks History of fall(s);Impaired balance/gait No Fall Risks Impaired balance/gait  Follow up Falls evaluation completed;Education provided Falls evaluation completed Falls evaluation completed;Falls prevention discussed Falls evaluation completed Falls evaluation completed;Falls prevention discussed    FALL RISK PREVENTION PERTAINING TO THE HOME:  Any stairs in or around the home? Yes  If so, are there any without handrails? Yes  Home free of loose throw rugs in walkways, pet beds, electrical cords, etc? Yes  Adequate lighting in your home to reduce risk of falls? Yes   ASSISTIVE DEVICES UTILIZED TO PREVENT FALLS:  Life alert? No  Use of a cane, walker or w/c? Yes  Grab bars in the bathroom? Yes  Shower chair or bench in shower? Yes  Elevated toilet seat or a handicapped toilet? Yes    Cognitive Function:        11/21/2020    1:28 PM  6CIT Screen  What Year? 0 points  What month? 0 points  What time? 0 points  Count back from 20 0 points  Months in reverse 0 points  Repeat phrase 0 points  Total Score 0 points    Immunizations Immunization History  Administered Date(s) Administered  . Janssen (J&J) SARS-COV-2 Vaccination 06/01/2019  . Moderna Sars-Covid-2 Vaccination 01/07/2020    TDAP status: Due, Education has been provided regarding the importance of this vaccine. Advised  may receive this vaccine at local pharmacy or  Health Dept. Aware to provide a copy of the vaccination record if obtained from local pharmacy or Health Dept. Verbalized acceptance and understanding.  Flu Vaccine status: Due, Education has been provided regarding the importance of this vaccine. Advised may receive this vaccine at local pharmacy or Health Dept. Aware to provide a copy of the vaccination record if obtained from local pharmacy or Health Dept. Verbalized acceptance and understanding.  Pneumococcal vaccine status: Up to date  Covid-19 vaccine status: Declined, Education has been provided regarding the importance of this vaccine but patient still declined. Advised may receive this vaccine at local pharmacy or Health Dept.or vaccine clinic. Aware to provide a copy of the vaccination record if obtained from local pharmacy or Health Dept. Verbalized acceptance and understanding.  Qualifies for Shingles Vaccine? No   Zostavax completed No   Shingrix Completed?: No.    Education has been provided regarding the importance of this vaccine. Patient has been advised to call insurance company to determine out of pocket expense if they have not yet received this vaccine. Advised may also receive vaccine at local pharmacy or Health Dept. Verbalized acceptance and understanding.  Screening Tests Health Maintenance  Topic Date Due  . TETANUS/TDAP  Never done  . Zoster Vaccines- Shingrix (1 of 2) Never done  . COVID-19 Vaccine (3 - Booster for Janssen series) 03/03/2020  . INFLUENZA VACCINE  Never done  . HEMOGLOBIN A1C  03/20/2022  . OPHTHALMOLOGY EXAM  05/17/2022  . Diabetic kidney evaluation - GFR measurement  09/18/2022  . Diabetic kidney evaluation - Urine ACR  09/18/2022  . FOOT EXAM  09/18/2022  . COLONOSCOPY (Pts 45-29yrs Insurance coverage will need to be confirmed)  09/28/2031  . Hepatitis C Screening  Completed  . HIV Screening  Completed  . HPV VACCINES  Aged Out    Health Maintenance  Health Maintenance Due  Topic Date  Due  . TETANUS/TDAP  Never done  . Zoster Vaccines- Shingrix (1 of 2) Never done  . COVID-19 Vaccine (3 - Booster for Janssen series) 03/03/2020  . INFLUENZA VACCINE  Never done    Colorectal cancer screening: Type of screening: Colonoscopy. Completed 09/27/2021. Repeat every 10 years  Lung Cancer Screening: (Low Dose CT Chest recommended if Age 13-80 years, 30 pack-year currently smoking OR have quit w/in 15years.) does not qualify.   Lung Cancer Screening Referral:   Additional Screening:  Hepatitis C Screening: does qualify; Completed 05/24/2020  Vision Screening: Recommended annual ophthalmology exams for early detection of glaucoma and other disorders of the eye. Is the patient up to date with their annual eye exam?  Yes  Who is the provider or what is the name of the office in which the patient attends annual eye exams? Dr.Richardson, [facility] If pt is not established with a provider, would they like to be referred to a provider to establish care? No .   Dental Screening: Recommended annual dental exams for proper oral hygiene  Community Resource Referral / Chronic Care Management: CRR required this visit?  No   CCM required this visit?  No      Plan:     I have personally reviewed and noted the following in the patient's chart:   Medical and social history Use of alcohol, tobacco or illicit drugs  Current medications and supplements include/ing opioid prescriptions. Patient is not currently taking opioid prescriptions. Functional ability and status Nutritional status Physical activity Advanced directives List  of other physicians Hospitalizations, surgeries, and ER visits in previous 12 months Vitals Screenings to include cognitive, depression, and falls Referrals and appointments  In addition, I have reviewed and discussed with patient certain preventive protocols, quality metrics, and best practice recommendations. A written personalized care plan for  preventive services as well as general preventive health recommendations were provided to patient.     Justin BanisterJeffrey Syris Brookens, MD   12/11/2021

## 2021-12-24 ENCOUNTER — Other Ambulatory Visit: Payer: Self-pay | Admitting: *Deleted

## 2021-12-24 MED ORDER — FLUTICASONE PROPIONATE 0.05 % EX CREA: TOPICAL_CREAM | CUTANEOUS | 3 refills | Status: DC

## 2022-01-01 DIAGNOSIS — E109 Type 1 diabetes mellitus without complications: Secondary | ICD-10-CM | POA: Diagnosis not present

## 2022-01-01 LAB — HM DIABETES EYE EXAM

## 2022-01-24 ENCOUNTER — Encounter: Payer: Self-pay | Admitting: Internal Medicine

## 2022-01-24 ENCOUNTER — Ambulatory Visit (INDEPENDENT_AMBULATORY_CARE_PROVIDER_SITE_OTHER): Payer: Medicare HMO | Admitting: Internal Medicine

## 2022-01-24 VITALS — BP 137/74 | HR 66 | Ht 68.0 in | Wt 200.2 lb

## 2022-01-24 DIAGNOSIS — E1140 Type 2 diabetes mellitus with diabetic neuropathy, unspecified: Secondary | ICD-10-CM

## 2022-01-24 DIAGNOSIS — E782 Mixed hyperlipidemia: Secondary | ICD-10-CM

## 2022-01-24 DIAGNOSIS — E039 Hypothyroidism, unspecified: Secondary | ICD-10-CM | POA: Diagnosis not present

## 2022-01-24 DIAGNOSIS — J4530 Mild persistent asthma, uncomplicated: Secondary | ICD-10-CM | POA: Diagnosis not present

## 2022-01-24 DIAGNOSIS — I10 Essential (primary) hypertension: Secondary | ICD-10-CM

## 2022-01-24 DIAGNOSIS — Z2821 Immunization not carried out because of patient refusal: Secondary | ICD-10-CM | POA: Diagnosis not present

## 2022-01-24 NOTE — Assessment & Plan Note (Signed)
BP Readings from Last 1 Encounters:  01/24/22 137/74   Well-controlled Counseled for compliance with the medications Advised DASH diet and moderate exercise/walking, at least 150 mins/week

## 2022-01-24 NOTE — Assessment & Plan Note (Signed)
Lab Results  Component Value Date   TSH 2.830 09/17/2021   On levothyroxine 150 mcg daily

## 2022-01-24 NOTE — Assessment & Plan Note (Signed)
Well-controlled with albuterol as needed On also has Breo, but uses it inconsistently

## 2022-01-24 NOTE — Assessment & Plan Note (Addendum)
Was given statin in the past, but felt sluggish with it Advised to follow low carb, low-cholesterol diet for now

## 2022-01-24 NOTE — Patient Instructions (Signed)
Please continue taking medications as prescribed.  Please continue to follow low carb diet and perform moderate exercise/walking at least 150 mins/week. 

## 2022-01-24 NOTE — Progress Notes (Addendum)
Established Patient Office Visit  Subjective:  Patient ID: Justin Klein, male    DOB: 1960-03-04  Age: 61 y.o. MRN: [id number]  CC:  Chief Complaint  Patient presents with   Follow-up    Follow up,discuss [facility] re-enrollment, colonoscopy results    HPI Justin Klein is a 61 y.o. male with past medical history of type II DM, hypothyroidism, HLD and PSVT who presents for f/u of his chronic medical conditions.  Type II DM: He has been taking Trulicity for it, but needs reenrollment for refills from patient assistance program.  Printed form was provided to the patient.  He denies any polyuria or polydipsia currently.  He does not like taking statin for HLD. He denies any chest pain or dyspnea currently.  He has history of PSVT, but has not had any dizziness or palpitations recently.  He has diltiazem as needed for palpitations.  He recently had colonoscopy, but was advised to get repeat colonoscopy after 1 year due to inadequate prep.  Past Medical History:  Diagnosis Date   Asthma    Asthma 08/26/2011   Bilateral leg weakness 07/21/2012   Diabetes mellitus, type 2 ([place])    Essential hypertension 10/27/2019   Hypothyroidism    Ingrown nail of great toe of left foot 05/11/2019   Numbness and tingling of both legs 07/21/2012   Pernicious anemia    PSVT (paroxysmal supraventricular tachycardia) 08/26/2011   Pulmonary embolus (HCC)    Diagnosed 9/12 at [facility]   Pulmonary embolus ([place]) 08/26/2011   Seborrheic dermatitis    Strain of muscle, fascia and tendon of lower back, initial encounter 01/24/2020   SVT (supraventricular tachycardia)    Wart on thumb 08/20/2019    Past Surgical History:  Procedure Laterality Date   COLONOSCOPY WITH PROPOFOL N/A 09/27/2021   Procedure: COLONOSCOPY WITH PROPOFOL;  Surgeon: Daneil Dolin, MD;  Location: [facility];  Service: Endoscopy;  Laterality: N/A;  9:30am   Epidermal cyst resection     Left patella tendon repair      Family  History  Problem Relation Age of Onset   Diabetes type II Mother    Cancer Father    Diabetes type II Sister     Social History   Socioeconomic History   Marital status: Divorced    Spouse name: Not on file   Number of children: 3   Years of education: Not on file   Highest education level: 12th grade  Occupational History   Not on file  Tobacco Use   Smoking status: Former    Types: Cigarettes   Smokeless tobacco: Never   Tobacco comments:    2012 quit  Substance and Sexual Activity   Alcohol use: No   Drug use: No   Sexual activity: Yes  Other Topics Concern   Not on file  Social History Narrative   Lives with fiance' Marcie Bal    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       Enjoys: music-jazz, reading-john grisham      Diet: no pork, salads daily, [place], uses sugar free sweeters, dark chocolate 70% or higher   Caffeine: decaff-sodas, ginger ale, tea-with truiva    Water: 3-4 cups       Wears seat belt   Smoke detectors at home   Does not use phone while driving    Social Determinants of Health   Financial Resource Strain: Low Risk  (11/21/2020)   Overall Financial Resource Strain (CARDIA)    Difficulty of Paying Living  Expenses: Not very hard  Food Insecurity: No Food Insecurity (11/21/2020)   Hunger Vital Sign    Worried About Running Out of Food in the Last Year: Never true    Ran Out of Food in the Last Year: Never true  Transportation Needs: No Transportation Needs (11/21/2020)   PRAPARE - Hydrologist (Medical): No    Lack of Transportation (Non-Medical): No  Physical Activity: Inactive (11/21/2020)   Exercise Vital Sign    Days of Exercise per Week: 0 days    Minutes of Exercise per Session: 0 min  Stress: No Stress Concern Present (11/21/2020)   [place]    Feeling of Stress : Not at all  Social Connections: Moderately Isolated (11/21/2020)   Social Connection and Isolation  Panel [NHANES]    Frequency of Communication with Friends and Family: More than three times a week    Frequency of Social Gatherings with Friends and Family: Once a week    Attends Religious Services: More than 4 times per year    Active Member of [employer] or Organizations: No    Attends Archivist Meetings: Never    Marital Status: Divorced  Human resources officer Violence: Not At Risk (11/21/2020)   Humiliation, Afraid, Rape, and Kick questionnaire    Fear of Current or Ex-Partner: No    Emotionally Abused: No    Physically Abused: No    Sexually Abused: No    Outpatient Medications Prior to Visit  Medication Sig Dispense Refill   albuterol (VENTOLIN HFA) 108 (90 Base) MCG/ACT inhaler INHALE 1 OR 2 PUFFS INTO THE LUNGS EVERY SIX HOURS AS NEEDED FOR WHEEZING OR SHORTNESS OF BREATH. 8.5 g 0   Alcohol Swabs (B-D SINGLE USE SWABS REGULAR) PADS USE AS DIRECTED 100 each 0   Cholecalciferol (VITAMIN D-3) 25 MCG (1000 UT) CAPS Take 1,000 Units by mouth daily.     Cyanocobalamin (B-12) 1000 MCG CAPS Take 1,000 mcg by mouth daily.     diltiazem (CARDIZEM) 30 MG tablet TAKE 1 TABLET (30 MG TOTAL) BY MOUTH DAILY. 90 tablet 2   Dulaglutide (TRULICITY) 3 0000000 SOPN Inject 3 mg into the skin once a week. (Patient taking differently: Inject 3 mg into the skin every Monday.) 6 mL 2   fluticasone (CUTIVATE) 0.05 % cream APPLY TO AFFECTED AREASCON FACE UP TO TWICE DAILYTAS NEEDED FOR RASH. 60 g 3   fluticasone furoate-vilanterol (BREO ELLIPTA) 100-25 MCG/INH AEPB Inhale 1 puff into the lungs daily. (Patient taking differently: Inhale 1 puff into the lungs daily as needed (asthma).) 90 each 2   glucose blood (TRUE METRIX BLOOD GLUCOSE TEST) test strip TEST DAILY 100 strip 3   levothyroxine (SYNTHROID) 150 MCG tablet Take 1 tablet (150 mcg total) by mouth every morning. 90 tablet 1   Multiple Vitamins-Minerals (MULTIVITAMIN WITH MINERALS) tablet Take 1 tablet by mouth daily.     Omega-3 Fatty Acids (FISH  OIL) 1000 MG CPDR Take 1,000 mg by mouth daily.     rosuvastatin (CRESTOR) 10 MG tablet Take 10 mg by mouth at bedtime.     sildenafil (VIAGRA) 100 MG tablet Take 1 tablet (100 mg total) by mouth as needed for erectile dysfunction. 10 tablet 1   tamsulosin (FLOMAX) 0.4 MG CAPS capsule Take 1 capsule (0.4 mg total) by mouth daily. 30 capsule 3   TRUEplus Lancets 33G MISC TEST DAILY 100 each 0   UNABLE TO FIND True Metrix  meter, True Metrix test strips, lancets, control solution, and alcohol swabs. 1 Product 0   polyethylene glycol-electrolytes (NULYTELY) 420 g solution As directed (Patient not taking: Reported on 01/24/2022) 4000 mL 0   No facility-administered medications prior to visit.    Allergies  Allergen Reactions   Metformin And Related Other (See Comments)    Low B12, gait disturbance, myopathy, neuropathy, pernicious anemia    Crestor [Rosuvastatin] Other (See Comments)    "Made me feel sluggish and drained" Planning to retry on 12/14/20   Shellfish Allergy Nausea And Vomiting    Seafood-upset stomach    ROS Review of Systems  Constitutional:  Negative for chills and fever.  HENT:  Negative for congestion and sore throat.   Eyes:  Negative for pain and discharge.  Respiratory:  Negative for cough and shortness of breath.   Cardiovascular:  Negative for chest pain and palpitations.  Gastrointestinal:  Negative for constipation, diarrhea, nausea and vomiting.  Endocrine: Negative for polydipsia and polyuria.  Genitourinary:  Positive for difficulty urinating (At nighttime with weak urine stream). Negative for dysuria and hematuria.  Musculoskeletal:  Negative for neck pain and neck stiffness.  Skin:  Negative for rash.  Neurological:  Negative for dizziness, weakness, numbness and headaches.  Psychiatric/Behavioral:  Negative for agitation and behavioral problems.       Objective:    Physical Exam Vitals reviewed.  Constitutional:      General: He is not in acute  distress.    Appearance: He is not diaphoretic.  HENT:     Head: Normocephalic and atraumatic.     Nose: Nose normal.     Mouth/Throat:     Mouth: Mucous membranes are moist.  Eyes:     General: No scleral icterus.    Extraocular Movements: Extraocular movements intact.  Cardiovascular:     Rate and Rhythm: Normal rate and regular rhythm.     Pulses: Normal pulses.     Heart sounds: Normal heart sounds. No murmur heard. Pulmonary:     Breath sounds: Normal breath sounds. No wheezing or rales.  Musculoskeletal:     Cervical back: Neck supple. No tenderness.     Right lower leg: No edema.     Left lower leg: No edema.  Skin:    General: Skin is warm.     Findings: No rash.  Neurological:     General: No focal deficit present.     Mental Status: He is alert and oriented to person, place, and time.     Cranial Nerves: No cranial nerve deficit.     Sensory: No sensory deficit.     Motor: No weakness.  Psychiatric:        Mood and Affect: Mood normal.        Behavior: Behavior normal.     BP 137/74 (BP Location: Right Arm, Patient Position: Sitting, Cuff Size: Large)   Pulse 66   Ht 5\' 8"  (1.727 m)   Wt 200 lb 3.2 oz (90.8 kg)   SpO2 96%   BMI 30.44 kg/m  Wt Readings from Last 3 Encounters:  01/24/22 200 lb 3.2 oz (90.8 kg)  09/27/21 197 lb (89.4 kg)  09/17/21 202 lb 9.6 oz (91.9 kg)    Lab Results  Component Value Date   TSH 2.830 09/17/2021   Lab Results  Component Value Date   WBC 4.5 09/17/2021   HGB 13.4 09/17/2021   HCT 40.5 09/17/2021   MCV 87 09/17/2021   PLT 221 09/17/2021  Lab Results  Component Value Date   NA 137 09/17/2021   K 4.5 09/17/2021   CO2 23 09/17/2021   GLUCOSE 92 09/17/2021   BUN 16 09/17/2021   CREATININE 1.08 09/17/2021   BILITOT 0.4 09/17/2021   ALKPHOS 78 09/17/2021   AST 19 09/17/2021   ALT 11 09/17/2021   PROT 7.7 09/17/2021   ALBUMIN 4.4 09/17/2021   CALCIUM 10.7 (H) 09/17/2021   EGFR 79 09/17/2021   Lab Results   Component Value Date   CHOL 142 09/17/2021   Lab Results  Component Value Date   HDL 46 09/17/2021   Lab Results  Component Value Date   LDLCALC 83 09/17/2021   Lab Results  Component Value Date   TRIG 62 09/17/2021   Lab Results  Component Value Date   CHOLHDL 3.1 09/17/2021   Lab Results  Component Value Date   HGBA1C 5.9 (H) 09/17/2021      Assessment & Plan:   Problem List Items Addressed This Visit       Cardiovascular and Mediastinum   Essential hypertension - Primary (Chronic)    BP Readings from Last 1 Encounters:  01/24/22 137/74  Well-controlled Counseled for compliance with the medications Advised DASH diet and moderate exercise/walking, at least 150 mins/week        Respiratory   Asthma (Chronic)    Well-controlled with albuterol as needed On also has Breo, but uses it inconsistently        Endocrine   Type 2 diabetes mellitus with diabetic neuropathy, without long-term current use of insulin (HCC) (Chronic)    Lab Results  Component Value Date   HGBA1C 5.9 (H) 0000000   On Trulicity 3 mg dose currently, refills sent to specialty pharmacy as per patient request -[facility] application form provided Advised to follow diabetic diet Does not agree to take statin F/u CMP and lipid panel Diabetic eye exam: Advised to follow up with Ophthalmology for diabetic eye exam      Relevant Orders   CMP14+EGFR   Hemoglobin A1c   Hemoglobin A1c   Hypothyroidism (Chronic)    Lab Results  Component Value Date   TSH 2.830 09/17/2021  On levothyroxine 150 mcg daily      Relevant Orders   TSH + free T4     Other   Hyperlipidemia (Chronic)    Was given statin in the past, but felt sluggish with it Advised to follow low carb, low-cholesterol diet for now      Relevant Orders   Lipid Profile   Hypercalcemia    Last CMP showed borderline elevated calcium Recheck BMP and intact PTH      Relevant Orders   Basic Metabolic Panel (BMET)    Parathyroid hormone, intact (no Ca)   Other Visit Diagnoses     Influenza vaccination declined           No orders of the defined types were placed in this encounter.   Follow-up: Return in about 4 months (around 05/25/2022) for DM and HTN.    Lindell Spar, MD

## 2022-01-24 NOTE — Assessment & Plan Note (Signed)
Last CMP showed borderline elevated calcium Recheck BMP and intact PTH

## 2022-01-24 NOTE — Assessment & Plan Note (Signed)
Lab Results  Component Value Date   HGBA1C 5.9 (H) 09/17/2021    On Trulicity 3 mg dose currently, refills sent to specialty pharmacy as per patient request -[facility] application form provided Advised to follow diabetic diet Does not agree to take statin F/u CMP and lipid panel Diabetic eye exam: Advised to follow up with Ophthalmology for diabetic eye exam

## 2022-01-26 LAB — HEMOGLOBIN A1C
Est. average glucose Bld gHb Est-mCnc: 128 mg/dL
Hgb A1c MFr Bld: 6.1 % — ABNORMAL HIGH (ref 4.8–5.6)

## 2022-01-26 LAB — BASIC METABOLIC PANEL
BUN/Creatinine Ratio: 8 — ABNORMAL LOW (ref 10–24)
BUN: 9 mg/dL (ref 8–27)
CO2: 23 mmol/L (ref 20–29)
Calcium: 10.4 mg/dL — ABNORMAL HIGH (ref 8.6–10.2)
Chloride: 104 mmol/L (ref 96–106)
Creatinine, Ser: 1.15 mg/dL (ref 0.76–1.27)
Glucose: 98 mg/dL (ref 70–99)
Potassium: 4.3 mmol/L (ref 3.5–5.2)
Sodium: 140 mmol/L (ref 134–144)
eGFR: 72 mL/min/{1.73_m2} (ref >59)

## 2022-01-26 LAB — PARATHYROID HORMONE, INTACT (NO CA): PTH: 54 pg/mL (ref 15–65)

## 2022-01-28 ENCOUNTER — Telehealth: Payer: Self-pay | Admitting: Internal Medicine

## 2022-01-28 NOTE — Telephone Encounter (Signed)
Patient assistance Program Application  forms  Copied Noted sleeved

## 2022-01-29 ENCOUNTER — Encounter: Payer: Self-pay | Admitting: Internal Medicine

## 2022-01-29 NOTE — Telephone Encounter (Signed)
Forms faxed, called patient he will pick up a copy.

## 2022-02-03 ENCOUNTER — Other Ambulatory Visit: Payer: Self-pay | Admitting: Internal Medicine

## 2022-02-03 DIAGNOSIS — N529 Male erectile dysfunction, unspecified: Secondary | ICD-10-CM

## 2022-02-04 ENCOUNTER — Other Ambulatory Visit: Payer: Self-pay

## 2022-02-04 DIAGNOSIS — N529 Male erectile dysfunction, unspecified: Secondary | ICD-10-CM

## 2022-02-04 MED ORDER — SILDENAFIL CITRATE 100 MG PO TABS: ORAL_TABLET | ORAL | 0 refills | Status: DC

## 2022-02-04 NOTE — Telephone Encounter (Signed)
Rx sent 

## 2022-02-25 ENCOUNTER — Other Ambulatory Visit: Payer: Self-pay | Admitting: Internal Medicine

## 2022-02-25 DIAGNOSIS — E039 Hypothyroidism, unspecified: Secondary | ICD-10-CM

## 2022-02-28 ENCOUNTER — Telehealth: Payer: Self-pay | Admitting: Internal Medicine

## 2022-02-28 NOTE — Telephone Encounter (Signed)
Handicap placard   Noted   Copied  Sleeved   Original in provider box  Copy in brown folder

## 2022-03-25 ENCOUNTER — Other Ambulatory Visit: Payer: Self-pay | Admitting: Internal Medicine

## 2022-04-09 ENCOUNTER — Other Ambulatory Visit: Payer: Self-pay | Admitting: Internal Medicine

## 2022-04-09 DIAGNOSIS — N529 Male erectile dysfunction, unspecified: Secondary | ICD-10-CM

## 2022-04-09 MED ORDER — SILDENAFIL CITRATE 100 MG PO TABS: ORAL_TABLET | ORAL | 0 refills | Status: DC

## 2022-05-23 DIAGNOSIS — E782 Mixed hyperlipidemia: Secondary | ICD-10-CM | POA: Diagnosis not present

## 2022-05-23 DIAGNOSIS — E039 Hypothyroidism, unspecified: Secondary | ICD-10-CM | POA: Diagnosis not present

## 2022-05-23 DIAGNOSIS — E1140 Type 2 diabetes mellitus with diabetic neuropathy, unspecified: Secondary | ICD-10-CM | POA: Diagnosis not present

## 2022-05-24 LAB — LIPID PANEL
Chol/HDL Ratio: 3 ratio (ref 0.0–5.0)
Cholesterol, Total: 151 mg/dL (ref 100–199)
HDL: 51 mg/dL (ref >39)
LDL Chol Calc (NIH): 89 mg/dL (ref 0–99)
Triglycerides: 51 mg/dL (ref 0–149)
VLDL Cholesterol Cal: 11 mg/dL (ref 5–40)

## 2022-05-24 LAB — CMP14+EGFR
ALT: 14 IU/L (ref 0–44)
AST: 18 IU/L (ref 0–40)
Albumin/Globulin Ratio: 1.4 (ref 1.2–2.2)
Albumin: 4.2 g/dL (ref 3.9–4.9)
Alkaline Phosphatase: 92 IU/L (ref 44–121)
BUN/Creatinine Ratio: 12 (ref 10–24)
BUN: 13 mg/dL (ref 8–27)
Bilirubin Total: 0.5 mg/dL (ref 0.0–1.2)
CO2: 24 mmol/L (ref 20–29)
Calcium: 10.2 mg/dL (ref 8.6–10.2)
Chloride: 104 mmol/L (ref 96–106)
Creatinine, Ser: 1.1 mg/dL (ref 0.76–1.27)
Globulin, Total: 3 g/dL (ref 1.5–4.5)
Glucose: 98 mg/dL (ref 70–99)
Potassium: 4.8 mmol/L (ref 3.5–5.2)
Sodium: 139 mmol/L (ref 134–144)
Total Protein: 7.2 g/dL (ref 6.0–8.5)
eGFR: 76 mL/min/{1.73_m2} (ref >59)

## 2022-05-24 LAB — TSH+FREE T4
Free T4: 1.52 ng/dL (ref 0.82–1.77)
TSH: 2.07 u[IU]/mL (ref 0.450–4.500)

## 2022-05-24 LAB — HEMOGLOBIN A1C
Est. average glucose Bld gHb Est-mCnc: 126 mg/dL
Hgb A1c MFr Bld: 6 % — ABNORMAL HIGH (ref 4.8–5.6)

## 2022-05-28 ENCOUNTER — Ambulatory Visit (INDEPENDENT_AMBULATORY_CARE_PROVIDER_SITE_OTHER): Payer: Medicare HMO | Admitting: Internal Medicine

## 2022-05-28 ENCOUNTER — Encounter: Payer: Self-pay | Admitting: Internal Medicine

## 2022-05-28 VITALS — BP 129/81 | HR 64 | Ht 68.0 in | Wt 205.4 lb

## 2022-05-28 DIAGNOSIS — E039 Hypothyroidism, unspecified: Secondary | ICD-10-CM | POA: Diagnosis not present

## 2022-05-28 DIAGNOSIS — I471 Supraventricular tachycardia, unspecified: Secondary | ICD-10-CM

## 2022-05-28 DIAGNOSIS — I10 Essential (primary) hypertension: Secondary | ICD-10-CM | POA: Diagnosis not present

## 2022-05-28 DIAGNOSIS — E1140 Type 2 diabetes mellitus with diabetic neuropathy, unspecified: Secondary | ICD-10-CM

## 2022-05-28 DIAGNOSIS — E782 Mixed hyperlipidemia: Secondary | ICD-10-CM | POA: Diagnosis not present

## 2022-05-28 DIAGNOSIS — J4530 Mild persistent asthma, uncomplicated: Secondary | ICD-10-CM

## 2022-05-28 DIAGNOSIS — R3911 Hesitancy of micturition: Secondary | ICD-10-CM

## 2022-05-28 DIAGNOSIS — E538 Deficiency of other specified B group vitamins: Secondary | ICD-10-CM

## 2022-05-28 MED ORDER — PRAVASTATIN SODIUM 20 MG PO TABS
20.0000 mg | ORAL_TABLET | Freq: Every day | ORAL | 1 refills | Status: DC
Start: 2022-05-28 — End: 2022-08-15

## 2022-05-28 MED ORDER — GABAPENTIN 100 MG PO CAPS
100.0000 mg | ORAL_CAPSULE | Freq: Every day | ORAL | 1 refills | Status: DC
Start: 2022-05-28 — End: 2022-07-10

## 2022-05-28 NOTE — Assessment & Plan Note (Signed)
BP Readings from Last 1 Encounters:  05/28/22 129/81   Well-controlled Counseled for compliance with the medications Advised DASH diet and moderate exercise/walking, at least 150 mins/week

## 2022-05-28 NOTE — Assessment & Plan Note (Signed)
Lab Results  Component Value Date   TSH 2.070 05/23/2022   On levothyroxine 150 mcg daily

## 2022-05-28 NOTE — Assessment & Plan Note (Signed)
Well-controlled with albuterol as needed On also has Breo, but uses it inconsistently 

## 2022-05-28 NOTE — Assessment & Plan Note (Signed)
Asymptomatic currently Has diltiazem as needed, as has not required it for a long time

## 2022-05-28 NOTE — Patient Instructions (Addendum)
Please start taking Pravastatin instead of Rosuvastatin. Okay to take CoQ10 200 mg once daily.  Please start taking Gabapentin 100 mg at bedtime for neuropathy.  Please take Vitamin B12 1000 mcg once daily.  Please continue to take other medications as prescribed.  Please continue to follow low carb diet and perform moderate exercise/walking at least 150 mins/week.  Please get fasting blood tests done before the next visit.

## 2022-05-28 NOTE — Progress Notes (Addendum)
Established Patient Office Visit  Subjective:  Patient ID: ANIR ROSENER, male    DOB: 03/24/1960  Age: 62 y.o. MRN: [id number]  CC:  Chief Complaint  Patient presents with   Hypertension    Follow up for hypertension. Patient states he has been falling a lot lately, he has balance issues. Patient thinks its been a month since his last fall.    HPI HARMONY ROSSNER is a 62 y.o. male with past medical history of type II DM, hypothyroidism, HLD and PSVT who presents for f/u of his chronic medical conditions.  Type II DM: His HbA1c is 6.0 now. He has been taking Trulicity for it, gets refills from patient assistance program. He denies any polyuria or polydipsia currently.  He does not like taking statin for HLD, he stopped taking Crestor about 3 weeks ago. He denies any chest pain or dyspnea currently.  Diabetic neuropathy: He has history of diabetic neuropathy and has been having gait disturbance.  He has had recurrent falls as well.  Denies any major injury.  He also has history of pernicious anemia, and has taken B12 oral supplements.  He has history of PSVT, but has not had any dizziness or palpitations recently.  He has diltiazem as needed for palpitations.  He recently had colonoscopy, but was advised to get repeat colonoscopy after 1 year due to inadequate prep.  Past Medical History:  Diagnosis Date   Asthma    Asthma 08/26/2011   Bilateral leg weakness 07/21/2012   Diabetes mellitus, type 2    Essential hypertension 10/27/2019   Hypothyroidism    Ingrown nail of great toe of left foot 05/11/2019   Numbness and tingling of both legs 07/21/2012   Pernicious anemia    PSVT (paroxysmal supraventricular tachycardia) 08/26/2011   Pulmonary embolus    Diagnosed 9/12 at [place]   Pulmonary embolus 08/26/2011   Seborrheic dermatitis    Strain of muscle, fascia and tendon of lower back, initial encounter 01/24/2020   SVT (supraventricular tachycardia)    Wart on thumb 08/20/2019     Past Surgical History:  Procedure Laterality Date   COLONOSCOPY WITH PROPOFOL N/A 09/27/2021   Procedure: COLONOSCOPY WITH PROPOFOL;  Surgeon: Corbin Ade, MD;  Location: [facility];  Service: Endoscopy;  Laterality: N/A;  9:30am   Epidermal cyst resection     Left patella tendon repair      Family History  Problem Relation Age of Onset   Diabetes type II Mother    Cancer Father    Diabetes type II Sister     Social History   Socioeconomic History   Marital status: Divorced    Spouse name: Not on file   Number of children: 3   Years of education: Not on file   Highest education level: 12th grade  Occupational History   Not on file  Tobacco Use   Smoking status: Former    Types: Cigarettes   Smokeless tobacco: Never   Tobacco comments:    2012 quit  Substance and Sexual Activity   Alcohol use: No   Drug use: No   Sexual activity: Yes  Other Topics Concern   Not on file  Social History Narrative   Lives with fiance' [name]    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       Enjoys: music-jazz, reading-john grisham      Diet: no pork, salads daily, [place], uses sugar free sweeters, dark chocolate 70% or higher   Caffeine: decaff-sodas, ginger ale,  tea-with Arville Lime    Water: 3-4 cups       Wears seat belt   Smoke detectors at home   Does not use phone while driving    Social Determinants of Health   Financial Resource Strain: Low Risk  (11/21/2020)   Overall Financial Resource Strain (CARDIA)    Difficulty of Paying Living Expenses: Not very hard  Food Insecurity: No Food Insecurity (11/21/2020)   Hunger Vital Sign    Worried About Running Out of Food in the Last Year: Never true    Ran Out of Food in the Last Year: Never true  Transportation Needs: No Transportation Needs (11/21/2020)   PRAPARE - Administrator, Civil Service (Medical): No    Lack of Transportation (Non-Medical): No  Physical Activity: Inactive (11/21/2020)   Exercise Vital Sign    Days of  Exercise per Week: 0 days    Minutes of Exercise per Session: 0 min  Stress: No Stress Concern Present (11/21/2020)   [employer] of Occupational Health - Occupational Stress Questionnaire    Feeling of Stress : Not at all  Social Connections: Moderately Isolated (11/21/2020)   Social Connection and Isolation Panel [NHANES]    Frequency of Communication with Friends and Family: More than three times a week    Frequency of Social Gatherings with Friends and Family: Once a week    Attends Religious Services: More than 4 times per year    Active Member of [employer] or Organizations: No    Attends Banker Meetings: Never    Marital Status: Divorced  Catering manager Violence: Not At Risk (11/21/2020)   Humiliation, Afraid, Rape, and Kick questionnaire    Fear of Current or Ex-Partner: No    Emotionally Abused: No    Physically Abused: No    Sexually Abused: No    Outpatient Medications Prior to Visit  Medication Sig Dispense Refill   albuterol (VENTOLIN HFA) 108 (90 Base) MCG/ACT inhaler INHALE 1 OR 2 PUFFS INTO THE LUNGS EVERY SIX HOURS AS NEEDED FOR WHEEZING OR SHORTNESS OF BREATH. 8.5 g 0   Alcohol Swabs (B-D SINGLE USE SWABS REGULAR) PADS USE AS DIRECTED 100 each 0   Cholecalciferol (VITAMIN D-3) 25 MCG (1000 UT) CAPS Take 1,000 Units by mouth daily.     Cyanocobalamin (B-12) 1000 MCG CAPS Take 1,000 mcg by mouth daily.     diltiazem (CARDIZEM) 30 MG tablet TAKE 1 TABLET (30 MG TOTAL) BY MOUTH DAILY. 90 tablet 2   Dulaglutide (TRULICITY) 3 MG/0.5ML SOPN Inject 3 mg into the skin once a week. (Patient taking differently: Inject 3 mg into the skin every Monday.) 6 mL 2   fluticasone (CUTIVATE) 0.05 % cream APPLY TO AFFECTED AREAS ON FACE UP TO TWICE DAILY AS NEEDED FOR RASH. 60 g 3   fluticasone furoate-vilanterol (BREO ELLIPTA) 100-25 MCG/INH AEPB Inhale 1 puff into the lungs daily. (Patient taking differently: Inhale 1 puff into the lungs daily as needed (asthma).) 90 each 2    glucose blood (TRUE METRIX BLOOD GLUCOSE TEST) test strip TEST DAILY 100 strip 3   levothyroxine (SYNTHROID) 150 MCG tablet TAKE 1 TABLET EVERY MORNING 90 tablet 3   Multiple Vitamins-Minerals (MULTIVITAMIN WITH MINERALS) tablet Take 1 tablet by mouth daily.     Omega-3 Fatty Acids (FISH OIL) 1000 MG CPDR Take 1,000 mg by mouth daily.     polyethylene glycol-electrolytes (NULYTELY) 420 g solution As directed (Patient not taking: Reported on 01/24/2022) 4000 mL 0  sildenafil (VIAGRA) 100 MG tablet TAKE 1 TABLET BY MOUTH AS NEEDED FOR ERECTILE DYSFUNCTION 20 tablet 0   tamsulosin (FLOMAX) 0.4 MG CAPS capsule Take 1 capsule (0.4 mg total) by mouth daily. 30 capsule 3   TRUEplus Lancets 33G MISC TEST DAILY 100 each 0   UNABLE TO FIND True Metrix meter, True Metrix test strips, lancets, control solution, and alcohol swabs. 1 Product 0   rosuvastatin (CRESTOR) 10 MG tablet Take 10 mg by mouth at bedtime.     No facility-administered medications prior to visit.    Allergies  Allergen Reactions   Metformin And Related Other (See Comments)    Low B12, gait disturbance, myopathy, neuropathy, pernicious anemia    Crestor [Rosuvastatin] Other (See Comments)    "Made me feel sluggish and drained" Planning to retry on 12/14/20   Shellfish Allergy Nausea And Vomiting    Seafood-upset stomach    ROS Review of Systems  Constitutional:  Negative for chills and fever.  HENT:  Negative for congestion and sore throat.   Eyes:  Negative for pain and discharge.  Respiratory:  Negative for cough and shortness of breath.   Cardiovascular:  Negative for chest pain and palpitations.  Gastrointestinal:  Negative for constipation, diarrhea, nausea and vomiting.  Endocrine: Negative for polydipsia and polyuria.  Genitourinary:  Positive for difficulty urinating (At nighttime with weak urine stream). Negative for dysuria and hematuria.  Musculoskeletal:  Negative for neck pain and neck stiffness.  Skin:   Negative for rash.  Neurological:  Negative for dizziness, weakness, numbness and headaches.  Psychiatric/Behavioral:  Negative for agitation and behavioral problems.       Objective:    Physical Exam Vitals reviewed.  Constitutional:      General: He is not in acute distress.    Appearance: He is not diaphoretic.  HENT:     Head: Normocephalic and atraumatic.     Nose: Nose normal.     Mouth/Throat:     Mouth: Mucous membranes are moist.  Eyes:     General: No scleral icterus.    Extraocular Movements: Extraocular movements intact.  Cardiovascular:     Rate and Rhythm: Normal rate and regular rhythm.     Pulses: Normal pulses.     Heart sounds: Normal heart sounds. No murmur heard. Pulmonary:     Breath sounds: Normal breath sounds. No wheezing or rales.  Musculoskeletal:     Cervical back: Neck supple. No tenderness.     Right lower leg: No edema.     Left lower leg: No edema.  Skin:    General: Skin is warm.     Findings: No rash.  Neurological:     General: No focal deficit present.     Mental Status: He is alert and oriented to person, place, and time.     Cranial Nerves: No cranial nerve deficit.     Sensory: No sensory deficit.     Motor: No weakness.  Psychiatric:        Mood and Affect: Mood normal.        Behavior: Behavior normal.     BP 129/81 (BP Location: Right Arm, Patient Position: Sitting, Cuff Size: Large)   Pulse 64   Ht 5\' 8"  (1.727 m)   Wt 205 lb 6.4 oz (93.2 kg)   SpO2 97%   BMI 31.23 kg/m  Wt Readings from Last 3 Encounters:  05/28/22 205 lb 6.4 oz (93.2 kg)  01/24/22 200 lb 3.2 oz (90.8 kg)  09/27/21 197 lb (89.4 kg)    Lab Results  Component Value Date   TSH 2.070 05/23/2022   Lab Results  Component Value Date   WBC 4.5 09/17/2021   HGB 13.4 09/17/2021   HCT 40.5 09/17/2021   MCV 87 09/17/2021   PLT 221 09/17/2021   Lab Results  Component Value Date   NA 139 05/23/2022   K 4.8 05/23/2022   CO2 24 05/23/2022   GLUCOSE  98 05/23/2022   BUN 13 05/23/2022   CREATININE 1.10 05/23/2022   BILITOT 0.5 05/23/2022   ALKPHOS 92 05/23/2022   AST 18 05/23/2022   ALT 14 05/23/2022   PROT 7.2 05/23/2022   ALBUMIN 4.2 05/23/2022   CALCIUM 10.2 05/23/2022   EGFR 76 05/23/2022   Lab Results  Component Value Date   CHOL 151 05/23/2022   Lab Results  Component Value Date   HDL 51 05/23/2022   Lab Results  Component Value Date   LDLCALC 89 05/23/2022   Lab Results  Component Value Date   TRIG 51 05/23/2022   Lab Results  Component Value Date   CHOLHDL 3.0 05/23/2022   Lab Results  Component Value Date   HGBA1C 6.0 (H) 05/23/2022      Assessment & Plan:   Problem List Items Addressed This Visit       Cardiovascular and Mediastinum   Essential hypertension (Chronic)    BP Readings from Last 1 Encounters:  05/28/22 129/81  Well-controlled Counseled for compliance with the medications Advised DASH diet and moderate exercise/walking, at least 150 mins/week      Relevant Medications   pravastatin (PRAVACHOL) 20 MG tablet   Other Relevant Orders   CBC with Differential/Platelet   PSVT (paroxysmal supraventricular tachycardia)    Asymptomatic currently Has diltiazem as needed, as has not required it for a long time      Relevant Medications   pravastatin (PRAVACHOL) 20 MG tablet     Respiratory   Asthma (Chronic)    Well-controlled with albuterol as needed On also has Breo, but uses it inconsistently      Relevant Orders   CBC with Differential/Platelet     Endocrine   Type 2 diabetes mellitus with diabetic neuropathy, without long-term current use of insulin - Primary (Chronic)    Lab Results  Component Value Date   HGBA1C 6.0 (H) 05/23/2022   On Trulicity 3 mg dose currently, refills sent to specialty pharmacy as per patient request -[facility] application approved Advised to follow diabetic diet Tried Crestor, but felt sluggish, switched to pravastatin F/u CMP and lipid  panel Diabetic eye exam: Advised to follow up with Ophthalmology for diabetic eye exam  Has recurrent falls likely due to diabetic neuropathy, started gabapentin 100 mg nightly      Relevant Medications   gabapentin (NEURONTIN) 100 MG capsule   pravastatin (PRAVACHOL) 20 MG tablet   Other Relevant Orders   CMP14+EGFR   Hemoglobin A1c   B12   Hypothyroidism (Chronic)    Lab Results  Component Value Date   TSH 2.070 05/23/2022  On levothyroxine 150 mcg daily      Relevant Orders   CBC with Differential/Platelet   TSH + free T4     Other   Hyperlipidemia (Chronic)    Was given statin in the past, but felt sluggish with it Tried Crestor, but felt sluggish, switched to pravastatin Advised to follow low carb, low-cholesterol diet for now      Relevant Medications  pravastatin (PRAVACHOL) 20 MG tablet   Urinary hesitancy    Chronic weak urinary stream, but only at nighttime, was given tamsulosin Followed by urology      Other Visit Diagnoses     B12 deficiency       Relevant Orders   B12      Meds ordered this encounter  Medications   gabapentin (NEURONTIN) 100 MG capsule    Sig: Take 1 capsule (100 mg total) by mouth at bedtime.    Dispense:  90 capsule    Refill:  1   pravastatin (PRAVACHOL) 20 MG tablet    Sig: Take 1 tablet (20 mg total) by mouth daily.    Dispense:  90 tablet    Refill:  1    Follow-up: Return in about 4 months (around 09/27/2022) for Annual physical.    Anabel Halon, MD

## 2022-05-28 NOTE — Assessment & Plan Note (Signed)
Chronic weak urinary stream, but only at nighttime, was given tamsulosin Followed by urology

## 2022-05-28 NOTE — Assessment & Plan Note (Addendum)
Was given statin in the past, but felt sluggish with it Tried Crestor, but felt sluggish, switched to pravastatin Advised to follow low carb, low-cholesterol diet for now

## 2022-05-28 NOTE — Assessment & Plan Note (Addendum)
Lab Results  Component Value Date   HGBA1C 6.0 (H) 0000000    On Trulicity 3 mg dose currently, refills sent to specialty pharmacy as per patient request -[facility] application approved Advised to follow diabetic diet Tried Crestor, but felt sluggish, switched to pravastatin F/u CMP and lipid panel Diabetic eye exam: Advised to follow up with Ophthalmology for diabetic eye exam  Has recurrent falls likely due to diabetic neuropathy, started gabapentin 100 mg nightly

## 2022-06-20 ENCOUNTER — Other Ambulatory Visit: Payer: Self-pay | Admitting: Internal Medicine

## 2022-06-20 DIAGNOSIS — N529 Male erectile dysfunction, unspecified: Secondary | ICD-10-CM

## 2022-06-21 MED ORDER — SILDENAFIL CITRATE 100 MG PO TABS
ORAL_TABLET | ORAL | 0 refills | Status: DC
Start: 2022-06-21 — End: 2022-08-15

## 2022-06-25 ENCOUNTER — Other Ambulatory Visit: Payer: Self-pay | Admitting: Internal Medicine

## 2022-07-10 ENCOUNTER — Encounter: Payer: Self-pay | Admitting: Internal Medicine

## 2022-07-10 ENCOUNTER — Other Ambulatory Visit: Payer: Self-pay

## 2022-07-10 DIAGNOSIS — E1140 Type 2 diabetes mellitus with diabetic neuropathy, unspecified: Secondary | ICD-10-CM

## 2022-07-10 MED ORDER — GABAPENTIN 100 MG PO CAPS
100.0000 mg | ORAL_CAPSULE | Freq: Every day | ORAL | 1 refills | Status: DC
Start: 2022-07-10 — End: 2022-10-03

## 2022-07-23 ENCOUNTER — Encounter: Payer: Self-pay | Admitting: Internal Medicine

## 2022-08-14 DIAGNOSIS — H5203 Hypermetropia, bilateral: Secondary | ICD-10-CM | POA: Diagnosis not present

## 2022-08-14 DIAGNOSIS — Z0100 Encounter for examination of eyes and vision without abnormal findings: Secondary | ICD-10-CM | POA: Diagnosis not present

## 2022-08-14 LAB — HM DIABETES EYE EXAM

## 2022-08-15 ENCOUNTER — Other Ambulatory Visit: Payer: Self-pay

## 2022-08-15 DIAGNOSIS — N529 Male erectile dysfunction, unspecified: Secondary | ICD-10-CM

## 2022-08-15 DIAGNOSIS — E782 Mixed hyperlipidemia: Secondary | ICD-10-CM

## 2022-08-15 MED ORDER — SILDENAFIL CITRATE 100 MG PO TABS
ORAL_TABLET | ORAL | 0 refills | Status: DC
Start: 2022-08-15 — End: 2022-08-15

## 2022-08-15 MED ORDER — PRAVASTATIN SODIUM 20 MG PO TABS
20.0000 mg | ORAL_TABLET | Freq: Every day | ORAL | 1 refills | Status: DC
Start: 2022-08-15 — End: 2023-01-08

## 2022-08-15 MED ORDER — SILDENAFIL CITRATE 100 MG PO TABS
ORAL_TABLET | ORAL | 0 refills | Status: DC
Start: 2022-08-15 — End: 2022-09-19

## 2022-09-09 ENCOUNTER — Encounter: Payer: Self-pay | Admitting: *Deleted

## 2022-09-18 ENCOUNTER — Telehealth: Payer: Self-pay | Admitting: Internal Medicine

## 2022-09-18 ENCOUNTER — Telehealth: Payer: Self-pay | Admitting: *Deleted

## 2022-09-18 NOTE — Telephone Encounter (Signed)
Pt brought by his questionnaire. His last colonoscopy was last year with [facility] and recommended to repeat in one year. He states he had a colonoscopy in 2013 at [facility]. Do I need to retrieve that report or no? Patient also put he drinks a 6 pack every 2 weeks. Please advise if he will need an OV prior.

## 2022-09-18 NOTE — Telephone Encounter (Signed)
Procedure: Colonoscopy  Estimated body mass index is 31.23 kg/m as calculated from the following:   Height as of 05/28/22: 5\' 8"  (1.727 m).   Weight as of 05/28/22: 205 lb 6.4 oz (93.2 kg).   Have you had a colonoscopy before?  2023 Dr. Jena Gauss  Do you have family history of colon cancer?  no  Do you have a family history of polyps? no  Previous colonoscopy with polyps removed? no  Do you have a history colorectal cancer?   no  Are you diabetic?  Type 2  Do you have a prosthetic or mechanical heart valve? no  Do you have a pacemaker/defibrillator?   no  Have you had endocarditis/atrial fibrillation?  no  Do you use supplemental oxygen/CPAP?  no  Have you had joint replacement within the last 12 months?  no  Do you tend to be constipated or have to use laxatives?  no   Do you have history of alcohol use? If yes, how much and how often.  Yes 6 pack every 2 weeks  Do you have history or are you using drugs? If yes, what do are you  using?  no  Have you ever had a stroke/heart attack?  no  Have you ever had a heart or other vascular stent placed,?no  Do you take weight loss medication? no   Do you take any blood-thinning medications such as: (Plavix, aspirin, Coumadin, Aggrenox, Brilinta, Xarelto, Eliquis, Pradaxa, Savaysa or Effient)? no  If yes we need the name, milligram, dosage and who is prescribing doctor:               Current Outpatient Medications  Medication Sig Dispense Refill   Dulaglutide (TRULICITY) 3 MG/0.5ML SOPN Inject 3 mg into the skin once a week. (Patient taking differently: Inject 3 mg into the skin every Monday.) 6 mL 2   levothyroxine (SYNTHROID) 150 MCG tablet TAKE 1 TABLET EVERY MORNING 90 tablet 3   albuterol (VENTOLIN HFA) 108 (90 Base) MCG/ACT inhaler INHALE 1 OR 2 PUFFS INTO THE LUNGS EVERY SIX HOURS AS NEEDED FOR WHEEZING OR SHORTNESS OF BREATH. 8.5 g 0   Alcohol Swabs (B-D SINGLE USE SWABS REGULAR) PADS USE AS DIRECTED 100 each 0    Cholecalciferol (VITAMIN D-3) 25 MCG (1000 UT) CAPS Take 1,000 Units by mouth daily.     Cyanocobalamin (B-12) 1000 MCG CAPS Take 1,000 mcg by mouth daily.     diltiazem (CARDIZEM) 30 MG tablet TAKE 1 TABLET (30 MG TOTAL) BY MOUTH DAILY. 90 tablet 2   fluticasone (CUTIVATE) 0.05 % cream APPLY TO AFFECTED AREAS ON FACE UP TO TWICE DAILY AS NEEDED FOR RASH. 60 g 11   fluticasone furoate-vilanterol (BREO ELLIPTA) 100-25 MCG/INH AEPB Inhale 1 puff into the lungs daily. (Patient taking differently: Inhale 1 puff into the lungs daily as needed (asthma).) 90 each 2   gabapentin (NEURONTIN) 100 MG capsule Take 1 capsule (100 mg total) by mouth at bedtime. 90 capsule 1   glucose blood (TRUE METRIX BLOOD GLUCOSE TEST) test strip TEST DAILY 100 strip 3   Multiple Vitamins-Minerals (MULTIVITAMIN WITH MINERALS) tablet Take 1 tablet by mouth daily.     Omega-3 Fatty Acids (FISH OIL) 1000 MG CPDR Take 1,000 mg by mouth daily.     pravastatin (PRAVACHOL) 20 MG tablet Take 1 tablet (20 mg total) by mouth daily. 90 tablet 1   sildenafil (VIAGRA) 100 MG tablet TAKE 1 TABLET BY MOUTH AS NEEDED FOR ERECTILE DYSFUNCTION 20 tablet 0  tamsulosin (FLOMAX) 0.4 MG CAPS capsule Take 1 capsule (0.4 mg total) by mouth daily. 30 capsule 3   TRUEplus Lancets 33G MISC TEST DAILY 100 each 0   UNABLE TO FIND True Metrix meter, True Metrix test strips, lancets, control solution, and alcohol swabs. 1 Product 0   No current facility-administered medications for this visit.    Allergies  Allergen Reactions   Metformin And Related Other (See Comments)    Low B12, gait disturbance, myopathy, neuropathy, pernicious anemia    Crestor [Rosuvastatin] Other (See Comments)    "Made me feel sluggish and drained" Planning to retry on 12/14/20   Shellfish Allergy Nausea And Vomiting    Seafood-upset stomach

## 2022-09-18 NOTE — Telephone Encounter (Signed)
We will enter triage for the [facility] to decide. Would not need as he had previous TCS here in 2023

## 2022-09-19 ENCOUNTER — Other Ambulatory Visit: Payer: Self-pay

## 2022-09-19 ENCOUNTER — Encounter: Payer: Self-pay | Admitting: Internal Medicine

## 2022-09-19 DIAGNOSIS — N529 Male erectile dysfunction, unspecified: Secondary | ICD-10-CM

## 2022-09-19 MED ORDER — SILDENAFIL CITRATE 100 MG PO TABS
ORAL_TABLET | ORAL | 0 refills | Status: DC
Start: 2022-09-19 — End: 2022-11-08

## 2022-10-01 DIAGNOSIS — I10 Essential (primary) hypertension: Secondary | ICD-10-CM | POA: Diagnosis not present

## 2022-10-01 DIAGNOSIS — E1140 Type 2 diabetes mellitus with diabetic neuropathy, unspecified: Secondary | ICD-10-CM | POA: Diagnosis not present

## 2022-10-01 DIAGNOSIS — E039 Hypothyroidism, unspecified: Secondary | ICD-10-CM | POA: Diagnosis not present

## 2022-10-01 DIAGNOSIS — E538 Deficiency of other specified B group vitamins: Secondary | ICD-10-CM | POA: Diagnosis not present

## 2022-10-01 DIAGNOSIS — J4530 Mild persistent asthma, uncomplicated: Secondary | ICD-10-CM | POA: Diagnosis not present

## 2022-10-03 ENCOUNTER — Ambulatory Visit (INDEPENDENT_AMBULATORY_CARE_PROVIDER_SITE_OTHER): Payer: Medicare HMO | Admitting: Internal Medicine

## 2022-10-03 ENCOUNTER — Encounter: Payer: Self-pay | Admitting: Internal Medicine

## 2022-10-03 VITALS — BP 110/68 | HR 69 | Ht 68.0 in | Wt 199.0 lb

## 2022-10-03 DIAGNOSIS — I471 Supraventricular tachycardia, unspecified: Secondary | ICD-10-CM

## 2022-10-03 DIAGNOSIS — M5136 Other intervertebral disc degeneration, lumbar region: Secondary | ICD-10-CM | POA: Diagnosis not present

## 2022-10-03 DIAGNOSIS — B029 Zoster without complications: Secondary | ICD-10-CM

## 2022-10-03 DIAGNOSIS — E1140 Type 2 diabetes mellitus with diabetic neuropathy, unspecified: Secondary | ICD-10-CM

## 2022-10-03 DIAGNOSIS — E782 Mixed hyperlipidemia: Secondary | ICD-10-CM | POA: Diagnosis not present

## 2022-10-03 DIAGNOSIS — Z0001 Encounter for general adult medical examination with abnormal findings: Secondary | ICD-10-CM

## 2022-10-03 DIAGNOSIS — E039 Hypothyroidism, unspecified: Secondary | ICD-10-CM | POA: Diagnosis not present

## 2022-10-03 DIAGNOSIS — Z7985 Long-term (current) use of injectable non-insulin antidiabetic drugs: Secondary | ICD-10-CM

## 2022-10-03 DIAGNOSIS — E559 Vitamin D deficiency, unspecified: Secondary | ICD-10-CM

## 2022-10-03 MED ORDER — VALACYCLOVIR HCL 1 G PO TABS: 1000.0000 mg | ORAL_TABLET | Freq: Three times a day (TID) | ORAL | 0 refills | Status: DC

## 2022-10-03 MED ORDER — CYCLOBENZAPRINE HCL 5 MG PO TABS
5.0000 mg | ORAL_TABLET | Freq: Two times a day (BID) | ORAL | 1 refills | Status: DC | PRN
Start: 2022-10-03 — End: 2023-05-12

## 2022-10-03 MED ORDER — GABAPENTIN 300 MG PO CAPS: 300.0000 mg | ORAL_CAPSULE | Freq: Every day | ORAL | 1 refills | Status: DC

## 2022-10-03 NOTE — Assessment & Plan Note (Signed)
Painful vesicles over right ankle area - likely shingles, has been history of shingles infection Started valacyclovir

## 2022-10-03 NOTE — Assessment & Plan Note (Signed)
Chronic low back pain, likely due to DDD of lumbar spine Has had mild relief from Tylenol Flexeril as needed for muscle spasms Heating pad and/or back brace as needed Avoid heavy lifting and frequent bending If  persistent, will get imaging

## 2022-10-03 NOTE — Assessment & Plan Note (Addendum)
Lab Results  Component Value Date   HGBA1C 6.0 (H) 10/01/2022   Well controlled On Trulicity 3 mg dose currently, refills sent to specialty pharmacy as per patient request -[facility] application approved Advised to follow diabetic diet On pravastatin F/u CMP and lipid panel Diabetic eye exam: Advised to follow up with Ophthalmology for diabetic eye exam  Has recurrent falls likely due to diabetic neuropathy, had started gabapentin 100 mg nightly -increased dose to 300 mg nightly

## 2022-10-03 NOTE — Assessment & Plan Note (Signed)
Lab Results  Component Value Date   TSH 3.450 10/01/2022   On levothyroxine 150 mcg daily

## 2022-10-03 NOTE — Assessment & Plan Note (Signed)
Asymptomatic currently Has diltiazem as needed, as has not required it for a long time 

## 2022-10-03 NOTE — Assessment & Plan Note (Deleted)
BP Readings from Last 1 Encounters:  10/03/22 110/68   Well-controlled with Diltiazem (for PSVT) Counseled for compliance with the medications Advised DASH diet and moderate exercise/walking, at least 150 mins/week

## 2022-10-03 NOTE — Assessment & Plan Note (Signed)

## 2022-10-03 NOTE — Patient Instructions (Signed)
Please take Flexeril as needed for back muscle spasms.  Avoid heavy lifting and frequent bending. Okay to apply heating pad and/or back brace for low back pain.  Please take Valacyclovir as prescribed for shingles.  Please continue to take other medications as prescribed.  Please continue to follow low carb diet and perform moderate exercise/walking at least 150 mins/week.  Please get fasting blood tests done before the next visit.

## 2022-10-03 NOTE — Progress Notes (Signed)
Established Patient Office Visit  Subjective:  Patient ID: Justin Klein, male    DOB: May 16, 1960  Age: 62 y.o. MRN: [id number]  CC:  Chief Complaint  Patient presents with   Annual Exam   Herpes Zoster    Patient feels he has a shingle outbreak on his feet   Back Pain    Patient fell a few months ago, he hit a knob, now having spasms and has a knot in his back     HPI Justin Klein is a 62 y.o. male with past medical history of type II DM, hypothyroidism, HLD and PSVT who presents for annual physical.  Type II DM:  His HbA1c is 6.0 now. He has been taking Trulicity for it, gets refills from patient assistance program. He denies any polyuria or polydipsia currently.  He takes Pravastatin for HLD. He denies any chest pain or dyspnea currently.  Diabetic neuropathy: He has history of diabetic neuropathy and has been having gait disturbance. He has had recurrent falls as well.  He was placed on gabapentin 100 mg nightly in the last visit, but has not seen much improvement.  Denies any major injury. He also has history of pernicious anemia, and has taken B12 oral supplements.   He has history of PSVT, but has not had any dizziness or palpitations recently.  He has diltiazem as needed for palpitations.  He reports painful blisters over right foot area for the last 2 days.  Denies any recent insect bite.  Rash is painful and intensely itchy.  Denies any fever or chills.  He reports chronic low back pain, sharp, intermittent, radiating to right hip area and worse with movement.  He has chronic numbness of the feet, likely from diabetic neuropathy.  Denies saddle anesthesia, urinary or stool incontinence.   Past Medical History:  Diagnosis Date   Asthma    Asthma 08/26/2011   Bilateral leg weakness 07/21/2012   Diabetes mellitus, type 2 (HCC)    Essential hypertension 10/27/2019   Hypothyroidism    Ingrown nail of great toe of left foot 05/11/2019   Numbness and tingling of both legs  07/21/2012   Pernicious anemia    PSVT (paroxysmal supraventricular tachycardia) 08/26/2011   Pulmonary embolus (HCC)    Diagnosed 9/12 at [facility]   Pulmonary embolus (HCC) 08/26/2011   Seborrheic dermatitis    Strain of muscle, fascia and tendon of lower back, initial encounter 01/24/2020   SVT (supraventricular tachycardia)    Wart on thumb 08/20/2019    Past Surgical History:  Procedure Laterality Date   COLONOSCOPY WITH PROPOFOL N/A 09/27/2021   Procedure: COLONOSCOPY WITH PROPOFOL;  Surgeon: Corbin Ade, MD;  Location: [facility];  Service: Endoscopy;  Laterality: N/A;  9:30am   Epidermal cyst resection     Left patella tendon repair      Family History  Problem Relation Age of Onset   Diabetes type II Mother    Cancer Father    Diabetes type II Sister     Social History   Socioeconomic History   Marital status: Divorced    Spouse name: Not on file   Number of children: 3   Years of education: Not on file   Highest education level: 12th grade  Occupational History   Not on file  Tobacco Use   Smoking status: Former    Types: Cigarettes   Smokeless tobacco: Never   Tobacco comments:    2012 quit  Substance and Sexual  Activity   Alcohol use: No   Drug use: No   Sexual activity: Yes  Other Topics Concern   Not on file  Social History Narrative   Lives with fiance' [name]    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       Enjoys: music-jazz, reading-john grisham      Diet: no pork, salads daily, [place], uses sugar free sweeters, dark chocolate 70% or higher   Caffeine: decaff-sodas, ginger ale, tea-with truiva    Water: 3-4 cups       Wears seat belt   Smoke detectors at home   Does not use phone while driving    Social Determinants of Health   Financial Resource Strain: Low Risk  (11/21/2020)   Overall Financial Resource Strain (CARDIA)    Difficulty of Paying Living Expenses: Not very hard  Food Insecurity: No Food Insecurity (11/21/2020)   Hunger Vital Sign    Worried  About Running Out of Food in the Last Year: Never true    Ran Out of Food in the Last Year: Never true  Transportation Needs: No Transportation Needs (11/21/2020)   PRAPARE - Administrator, Civil Service (Medical): No    Lack of Transportation (Non-Medical): No  Physical Activity: Inactive (11/21/2020)   Exercise Vital Sign    Days of Exercise per Week: 0 days    Minutes of Exercise per Session: 0 min  Stress: No Stress Concern Present (11/21/2020)   [employer] of Occupational Health - Occupational Stress Questionnaire    Feeling of Stress : Not at all  Social Connections: Moderately Isolated (11/21/2020)   Social Connection and Isolation Panel [NHANES]    Frequency of Communication with Friends and Family: More than three times a week    Frequency of Social Gatherings with Friends and Family: Once a week    Attends Religious Services: More than 4 times per year    Active Member of [employer] or Organizations: No    Attends Banker Meetings: Never    Marital Status: Divorced  Catering manager Violence: Not At Risk (11/21/2020)   Humiliation, Afraid, Rape, and Kick questionnaire    Fear of Current or Ex-Partner: No    Emotionally Abused: No    Physically Abused: No    Sexually Abused: No    Outpatient Medications Prior to Visit  Medication Sig Dispense Refill   albuterol (VENTOLIN HFA) 108 (90 Base) MCG/ACT inhaler INHALE 1 OR 2 PUFFS INTO THE LUNGS EVERY SIX HOURS AS NEEDED FOR WHEEZING OR SHORTNESS OF BREATH. 8.5 g 0   Alcohol Swabs (B-D SINGLE USE SWABS REGULAR) PADS USE AS DIRECTED 100 each 0   Cholecalciferol (VITAMIN D-3) 25 MCG (1000 UT) CAPS Take 1,000 Units by mouth daily.     Cyanocobalamin (B-12) 1000 MCG CAPS Take 1,000 mcg by mouth daily.     diltiazem (CARDIZEM) 30 MG tablet TAKE 1 TABLET (30 MG TOTAL) BY MOUTH DAILY. 90 tablet 2   Dulaglutide (TRULICITY) 3 MG/0.5ML SOPN Inject 3 mg into the skin once a week. (Patient taking differently: Inject 3  mg into the skin every Monday.) 6 mL 2   fluticasone (CUTIVATE) 0.05 % cream APPLY TO AFFECTED AREAS ON FACE UP TO TWICE DAILY AS NEEDED FOR RASH. 60 g 11   fluticasone furoate-vilanterol (BREO ELLIPTA) 100-25 MCG/INH AEPB Inhale 1 puff into the lungs daily. (Patient taking differently: Inhale 1 puff into the lungs daily as needed (asthma).) 90 each 2   glucose blood (TRUE METRIX BLOOD GLUCOSE  TEST) test strip TEST DAILY 100 strip 3   levothyroxine (SYNTHROID) 150 MCG tablet TAKE 1 TABLET EVERY MORNING 90 tablet 3   Multiple Vitamins-Minerals (MULTIVITAMIN WITH MINERALS) tablet Take 1 tablet by mouth daily.     Omega-3 Fatty Acids (FISH OIL) 1000 MG CPDR Take 1,000 mg by mouth daily.     pravastatin (PRAVACHOL) 20 MG tablet Take 1 tablet (20 mg total) by mouth daily. 90 tablet 1   sildenafil (VIAGRA) 100 MG tablet TAKE 1 TABLET BY MOUTH AS NEEDED FOR ERECTILE DYSFUNCTION 20 tablet 0   tamsulosin (FLOMAX) 0.4 MG CAPS capsule Take 1 capsule (0.4 mg total) by mouth daily. 30 capsule 3   TRUEplus Lancets 33G MISC TEST DAILY 100 each 0   UNABLE TO FIND True Metrix meter, True Metrix test strips, lancets, control solution, and alcohol swabs. 1 Product 0   gabapentin (NEURONTIN) 100 MG capsule Take 1 capsule (100 mg total) by mouth at bedtime. 90 capsule 1   No facility-administered medications prior to visit.    Allergies  Allergen Reactions   Metformin And Related Other (See Comments)    Low B12, gait disturbance, myopathy, neuropathy, pernicious anemia    Crestor [Rosuvastatin] Other (See Comments)    "Made me feel sluggish and drained" Planning to retry on 12/14/20   Shellfish Allergy Nausea And Vomiting    Seafood-upset stomach    ROS Review of Systems  Constitutional:  Negative for chills and fever.  HENT:  Negative for congestion and sore throat.   Eyes:  Negative for pain and discharge.  Respiratory:  Negative for cough and shortness of breath.   Cardiovascular:  Negative for chest  pain and palpitations.  Gastrointestinal:  Negative for constipation, diarrhea, nausea and vomiting.  Endocrine: Negative for polydipsia and polyuria.  Genitourinary:  Positive for difficulty urinating (At nighttime with weak urine stream). Negative for dysuria and hematuria.  Musculoskeletal:  Positive for back pain. Negative for neck pain and neck stiffness.  Skin:  Positive for rash.  Neurological:  Positive for numbness (B/l feet). Negative for dizziness, weakness and headaches.  Psychiatric/Behavioral:  Negative for agitation and behavioral problems.       Objective:    Physical Exam Vitals reviewed.  Constitutional:      General: He is not in acute distress.    Appearance: He is not diaphoretic.  HENT:     Head: Normocephalic and atraumatic.     Nose: Nose normal.     Mouth/Throat:     Mouth: Mucous membranes are moist.  Eyes:     General: No scleral icterus.    Extraocular Movements: Extraocular movements intact.  Cardiovascular:     Rate and Rhythm: Normal rate and regular rhythm.     Pulses: Normal pulses.     Heart sounds: Normal heart sounds. No murmur heard. Pulmonary:     Breath sounds: Normal breath sounds. No wheezing or rales.  Abdominal:     Palpations: Abdomen is soft.     Tenderness: There is no abdominal tenderness.  Musculoskeletal:     Cervical back: Neck supple. No tenderness.     Lumbar back: Tenderness (Lower lumbar and right paraspinal area) present. Negative right straight leg raise test and negative left straight leg raise test.     Right lower leg: No edema.     Left lower leg: No edema.  Skin:    General: Skin is warm.     Findings: Rash (Painful vesicles over right ankle area) present.  Neurological:     General: No focal deficit present.     Mental Status: He is alert and oriented to person, place, and time.     Cranial Nerves: No cranial nerve deficit.     Sensory: Sensory deficit (B/l feet) present.     Motor: No weakness.   Psychiatric:        Mood and Affect: Mood normal.        Behavior: Behavior normal.     BP 110/68 (BP Location: Right Arm, Patient Position: Sitting, Cuff Size: Large)   Pulse 69   Ht 5\' 8"  (1.727 m)   Wt 199 lb (90.3 kg)   SpO2 97%   BMI 30.26 kg/m  Wt Readings from Last 3 Encounters:  10/03/22 199 lb (90.3 kg)  05/28/22 205 lb 6.4 oz (93.2 kg)  01/24/22 200 lb 3.2 oz (90.8 kg)    Lab Results  Component Value Date   TSH 3.450 10/01/2022   Lab Results  Component Value Date   WBC 3.8 10/01/2022   HGB 13.0 10/01/2022   HCT 40.5 10/01/2022   MCV 90 10/01/2022   PLT 212 10/01/2022   Lab Results  Component Value Date   NA 140 10/01/2022   K 5.0 10/01/2022   CO2 24 10/01/2022   GLUCOSE 94 10/01/2022   BUN 15 10/01/2022   CREATININE 1.17 10/01/2022   BILITOT 0.5 10/01/2022   ALKPHOS 89 10/01/2022   AST 21 10/01/2022   ALT 9 10/01/2022   PROT 7.4 10/01/2022   ALBUMIN 4.4 10/01/2022   CALCIUM 10.4 (H) 10/01/2022   EGFR 71 10/01/2022   Lab Results  Component Value Date   CHOL 151 05/23/2022   Lab Results  Component Value Date   HDL 51 05/23/2022   Lab Results  Component Value Date   LDLCALC 89 05/23/2022   Lab Results  Component Value Date   TRIG 51 05/23/2022   Lab Results  Component Value Date   CHOLHDL 3.0 05/23/2022   Lab Results  Component Value Date   HGBA1C 6.0 (H) 10/01/2022      Assessment & Plan:   Problem List Items Addressed This Visit       Cardiovascular and Mediastinum   PSVT (paroxysmal supraventricular tachycardia)    Asymptomatic currently Has diltiazem as needed, as has not required it for a long time        Endocrine   Type 2 diabetes mellitus with diabetic neuropathy, without long-term current use of insulin (HCC) (Chronic)    Lab Results  Component Value Date   HGBA1C 6.0 (H) 10/01/2022   Well controlled On Trulicity 3 mg dose currently, refills sent to specialty pharmacy as per patient request -[facility]  application approved Advised to follow diabetic diet On pravastatin F/u CMP and lipid panel Diabetic eye exam: Advised to follow up with Ophthalmology for diabetic eye exam  Has recurrent falls likely due to diabetic neuropathy, had started gabapentin 100 mg nightly -increased dose to 300 mg nightly      Relevant Medications   gabapentin (NEURONTIN) 300 MG capsule   Other Relevant Orders   Urine Microalbumin w/creat. ratio   CMP14+EGFR   Hemoglobin A1c   Hypothyroidism (Chronic)    Lab Results  Component Value Date   TSH 3.450 10/01/2022   On levothyroxine 150 mcg daily        Musculoskeletal and Integument   DDD (degenerative disc disease), lumbar    Chronic low back pain, likely due to DDD of  lumbar spine Has had mild relief from Tylenol Flexeril as needed for muscle spasms Heating pad and/or back brace as needed Avoid heavy lifting and frequent bending If  persistent, will get imaging       Relevant Medications   cyclobenzaprine (FLEXERIL) 5 MG tablet     Other   Hyperlipidemia (Chronic)   Relevant Orders   Lipid Profile   Encounter for general adult medical examination with abnormal findings - Primary    Physical exam as documented. Counseling done  re healthy lifestyle involving commitment to 150 minutes exercise per week, heart healthy diet, and attaining healthy weight.The importance of adequate sleep also discussed. Changes in health habits are decided on by the patient with goals and time frames  set for achieving them. Immunization and cancer screening needs are specifically addressed at this visit.      Herpes zoster without complication    Painful vesicles over right ankle area - likely shingles, has been history of shingles infection Started valacyclovir      Relevant Medications   valACYclovir (VALTREX) 1000 MG tablet   Other Visit Diagnoses     Vitamin D deficiency       Relevant Orders   Vitamin D (25 hydroxy)       Meds ordered this  encounter  Medications   gabapentin (NEURONTIN) 300 MG capsule    Sig: Take 1 capsule (300 mg total) by mouth at bedtime.    Dispense:  90 capsule    Refill:  1   valACYclovir (VALTREX) 1000 MG tablet    Sig: Take 1 tablet (1,000 mg total) by mouth 3 (three) times daily.    Dispense:  21 tablet    Refill:  0   cyclobenzaprine (FLEXERIL) 5 MG tablet    Sig: Take 1 tablet (5 mg total) by mouth 2 (two) times daily as needed.    Dispense:  30 tablet    Refill:  1    Follow-up: Return in about 4 months (around 02/02/2023) for DM and PSVT.    Anabel Halon, MD

## 2022-10-05 ENCOUNTER — Encounter: Payer: Self-pay | Admitting: Internal Medicine

## 2022-10-11 ENCOUNTER — Other Ambulatory Visit: Payer: Self-pay | Admitting: Internal Medicine

## 2022-10-11 DIAGNOSIS — W57XXXD Bitten or stung by nonvenomous insect and other nonvenomous arthropods, subsequent encounter: Secondary | ICD-10-CM

## 2022-10-11 MED ORDER — HYDROXYZINE HCL 10 MG PO TABS
10.0000 mg | ORAL_TABLET | Freq: Two times a day (BID) | ORAL | 0 refills | Status: DC | PRN
Start: 2022-10-11 — End: 2023-05-12

## 2022-10-11 MED ORDER — DOXYCYCLINE HYCLATE 100 MG PO TABS: 100.0000 mg | ORAL_TABLET | Freq: Two times a day (BID) | ORAL | 0 refills | Status: DC

## 2022-10-13 ENCOUNTER — Other Ambulatory Visit: Payer: Self-pay | Admitting: Internal Medicine

## 2022-10-13 DIAGNOSIS — I10 Essential (primary) hypertension: Secondary | ICD-10-CM

## 2022-11-05 NOTE — Telephone Encounter (Signed)
Noted. Will call pt to schedule once we receive future schedule

## 2022-11-08 ENCOUNTER — Encounter: Payer: Self-pay | Admitting: Internal Medicine

## 2022-11-08 ENCOUNTER — Other Ambulatory Visit: Payer: Self-pay

## 2022-11-08 DIAGNOSIS — N529 Male erectile dysfunction, unspecified: Secondary | ICD-10-CM

## 2022-11-08 MED ORDER — SILDENAFIL CITRATE 100 MG PO TABS: ORAL_TABLET | ORAL | 0 refills | Status: DC

## 2022-11-08 MED ORDER — SILDENAFIL CITRATE 100 MG PO TABS
ORAL_TABLET | ORAL | 0 refills | Status: DC
Start: 2022-11-08 — End: 2022-11-08

## 2022-11-11 ENCOUNTER — Encounter: Payer: Self-pay | Admitting: *Deleted

## 2022-11-11 ENCOUNTER — Other Ambulatory Visit: Payer: Self-pay | Admitting: *Deleted

## 2022-11-11 MED ORDER — PEG 3350-KCL-NA BICARB-NACL 420 G PO SOLR: 4000.0000 mL | Freq: Once | ORAL | 0 refills | Status: AC

## 2022-11-11 NOTE — Telephone Encounter (Signed)
Pt has been scheduled for 12/02/22 with Dr. Jena Gauss. Instructions sent via MyChart and prep sent to the pharmacy

## 2022-11-11 NOTE — Telephone Encounter (Signed)
[employer] PA: Approved Authorization #[id number]  Tracking #[id number] Dates of service 12/02/2022 - 03/03/2023

## 2022-11-13 NOTE — Telephone Encounter (Signed)
Questionnaire from recall, no referral needed  

## 2022-11-29 ENCOUNTER — Encounter: Payer: Self-pay | Admitting: Internal Medicine

## 2022-11-29 ENCOUNTER — Other Ambulatory Visit: Payer: Self-pay | Admitting: Internal Medicine

## 2022-11-29 DIAGNOSIS — S6990XA Unspecified injury of unspecified wrist, hand and finger(s), initial encounter: Secondary | ICD-10-CM

## 2022-11-29 MED ORDER — ZINC OXIDE 20 % EX OINT
1.0000 | TOPICAL_OINTMENT | Freq: Two times a day (BID) | CUTANEOUS | 0 refills | Status: DC
Start: 2022-11-29 — End: 2023-05-12

## 2022-12-02 ENCOUNTER — Ambulatory Visit (HOSPITAL_COMMUNITY)
Admission: RE | Admit: 2022-12-02 | Discharge: 2022-12-02 | Disposition: A | Discharge status: Home / Self Care | Payer: Medicare HMO | Attending: Internal Medicine | Admitting: Internal Medicine

## 2022-12-02 ENCOUNTER — Encounter (HOSPITAL_COMMUNITY)
Admission: RE | Disposition: A | Discharge status: Home / Self Care | Payer: Self-pay | Source: Home / Self Care | Attending: Internal Medicine

## 2022-12-02 ENCOUNTER — Ambulatory Visit (HOSPITAL_COMMUNITY): Payer: Medicare HMO | Admitting: Anesthesiology

## 2022-12-02 ENCOUNTER — Other Ambulatory Visit: Payer: Self-pay

## 2022-12-02 ENCOUNTER — Encounter (HOSPITAL_COMMUNITY): Payer: Self-pay | Admitting: Internal Medicine

## 2022-12-02 DIAGNOSIS — K6389 Other specified diseases of intestine: Secondary | ICD-10-CM | POA: Insufficient documentation

## 2022-12-02 DIAGNOSIS — F1290 Cannabis use, unspecified, uncomplicated: Secondary | ICD-10-CM | POA: Insufficient documentation

## 2022-12-02 DIAGNOSIS — J45909 Unspecified asthma, uncomplicated: Secondary | ICD-10-CM | POA: Insufficient documentation

## 2022-12-02 DIAGNOSIS — Z79890 Hormone replacement therapy (postmenopausal): Secondary | ICD-10-CM | POA: Diagnosis not present

## 2022-12-02 DIAGNOSIS — I10 Essential (primary) hypertension: Secondary | ICD-10-CM | POA: Diagnosis not present

## 2022-12-02 DIAGNOSIS — E119 Type 2 diabetes mellitus without complications: Secondary | ICD-10-CM | POA: Diagnosis not present

## 2022-12-02 DIAGNOSIS — Z86711 Personal history of pulmonary embolism: Secondary | ICD-10-CM | POA: Diagnosis not present

## 2022-12-02 DIAGNOSIS — E039 Hypothyroidism, unspecified: Secondary | ICD-10-CM

## 2022-12-02 DIAGNOSIS — K640 First degree hemorrhoids: Secondary | ICD-10-CM | POA: Insufficient documentation

## 2022-12-02 DIAGNOSIS — Z1211 Encounter for screening for malignant neoplasm of colon: Secondary | ICD-10-CM | POA: Diagnosis not present

## 2022-12-02 DIAGNOSIS — Z7951 Long term (current) use of inhaled steroids: Secondary | ICD-10-CM | POA: Diagnosis not present

## 2022-12-02 DIAGNOSIS — Z87891 Personal history of nicotine dependence: Secondary | ICD-10-CM | POA: Diagnosis not present

## 2022-12-02 HISTORY — PX: COLONOSCOPY WITH PROPOFOL: SHX5780

## 2022-12-02 LAB — GLUCOSE, CAPILLARY
Glucose-Capillary: 94 mg/dL (ref 70–99)
Glucose-Capillary: 95 mg/dL (ref 70–99)

## 2022-12-02 SURGERY — COLONOSCOPY WITH PROPOFOL
Anesthesia: General

## 2022-12-02 MED ORDER — LIDOCAINE HCL 1 % IJ SOLN
INTRAMUSCULAR | Status: DC | PRN
  Administered 2022-12-02: 50 mg via INTRADERMAL

## 2022-12-02 MED ORDER — PROPOFOL 10 MG/ML IV BOLUS
INTRAVENOUS | Status: DC | PRN
  Administered 2022-12-02: 70 mg via INTRAVENOUS

## 2022-12-02 MED ORDER — STERILE WATER FOR IRRIGATION IR SOLN
Status: DC | PRN
  Administered 2022-12-02: 120 mL

## 2022-12-02 MED ORDER — LACTATED RINGERS IV SOLN
INTRAVENOUS | Status: DC | PRN
Start: 2022-12-02 — End: 2022-12-02

## 2022-12-02 MED ORDER — LACTATED RINGERS IV SOLN: INTRAVENOUS | Status: DC

## 2022-12-02 MED ORDER — PROPOFOL 500 MG/50ML IV EMUL
INTRAVENOUS | Status: DC | PRN
  Administered 2022-12-02: 150 ug/kg/min via INTRAVENOUS

## 2022-12-02 NOTE — H&P (Signed)
@LOGO @   Primary Care Physician:  Anabel Halon, MD Primary Gastroenterologist:  Dr. Jena Gauss  Pre-Procedure History & Physical: HPI:  Justin Klein is a 62 y.o. male is here for a screening colonoscopy.  Inadequate preparation 1 year ago.  Here for screening examination.  Past Medical History:  Diagnosis Date   Asthma    Asthma 08/26/2011   Bilateral leg weakness 07/21/2012   Diabetes mellitus, type 2 (HCC)    Essential hypertension 10/27/2019   Hypothyroidism    Ingrown nail of great toe of left foot 05/11/2019   Numbness and tingling of both legs 07/21/2012   Pernicious anemia    PSVT (paroxysmal supraventricular tachycardia) (HCC) 08/26/2011   Pulmonary embolus (HCC)    Diagnosed 9/12 at [facility]   Pulmonary embolus (HCC) 08/26/2011   Seborrheic dermatitis    Strain of muscle, fascia and tendon of lower back, initial encounter 01/24/2020   SVT (supraventricular tachycardia) (HCC)    Wart on thumb 08/20/2019    Past Surgical History:  Procedure Laterality Date   COLONOSCOPY WITH PROPOFOL N/A 09/27/2021   Procedure: COLONOSCOPY WITH PROPOFOL;  Surgeon: Corbin Ade, MD;  [facility];  Service: Endoscopy;  Laterality: N/A;  9:30am   Epidermal cyst resection     Left patella tendon repair      Prior to Admission medications   Medication Sig Start Date End Date Taking? Authorizing Provider  fluticasone (CUTIVATE) 0.05 % cream APPLY TO AFFECTED AREAS ON FACE UP TO TWICE DAILY AS NEEDED FOR RASH. 06/26/22  Yes Anabel Halon, MD  gabapentin (NEURONTIN) 300 MG capsule Take 1 capsule (300 mg total) by mouth at bedtime. 10/03/22  Yes Anabel Halon, MD  albuterol (VENTOLIN HFA) 108 (90 Base) MCG/ACT inhaler INHALE 1 OR 2 PUFFS INTO THE LUNGS EVERY SIX HOURS AS NEEDED FOR WHEEZING OR SHORTNESS OF BREATH. 01/16/21   Anabel Halon, MD  Alcohol Swabs (B-D SINGLE USE SWABS REGULAR) PADS USE AS DIRECTED 07/03/20   Heather Roberts, NP  Cholecalciferol (VITAMIN D-3) 25 MCG (1000 UT)  CAPS Take 1,000 Units by mouth daily.    [provider]  Cyanocobalamin (B-12) 1000 MCG CAPS Take 1,000 mcg by mouth daily.    [provider]  cyclobenzaprine (FLEXERIL) 5 MG tablet Take 1 tablet (5 mg total) by mouth 2 (two) times daily as needed. 10/03/22   Anabel Halon, MD  diltiazem (CARDIZEM) 30 MG tablet TAKE 1 TABLET EVERY DAY 10/14/22   Anabel Halon, MD  Dulaglutide (TRULICITY) 3 MG/0.5ML SOPN Inject 3 mg into the skin once a week. Patient taking differently: Inject 3 mg into the skin every Monday. 09/17/21   Anabel Halon, MD  fluticasone furoate-vilanterol (BREO ELLIPTA) 100-25 MCG/INH AEPB Inhale 1 puff into the lungs daily. Patient taking differently: Inhale 1 puff into the lungs daily as needed (asthma). 03/15/20   Freddy Finner, NP  glucose blood (TRUE METRIX BLOOD GLUCOSE TEST) test strip TEST DAILY 03/13/21   Anabel Halon, MD  hydrOXYzine (ATARAX) 10 MG tablet Take 1 tablet (10 mg total) by mouth 2 (two) times daily as needed for itching. 10/11/22   Anabel Halon, MD  levothyroxine (SYNTHROID) 150 MCG tablet TAKE 1 TABLET EVERY MORNING 02/26/22   Anabel Halon, MD  Multiple Vitamins-Minerals (MULTIVITAMIN WITH MINERALS) tablet Take 1 tablet by mouth daily.    [provider]  Omega-3 Fatty Acids (FISH OIL) 1000 MG CPDR Take 1,000 mg by mouth  daily.    [provider]  pravastatin (PRAVACHOL) 20 MG tablet Take 1 tablet (20 mg total) by mouth daily. 08/15/22   Anabel Halon, MD  sildenafil (VIAGRA) 100 MG tablet TAKE 1 TABLET BY MOUTH AS NEEDED FOR ERECTILE DYSFUNCTION 11/08/22   Anabel Halon, MD  tamsulosin (FLOMAX) 0.4 MG CAPS capsule Take 1 capsule (0.4 mg total) by mouth daily. 02/05/21   Heather Roberts, NP  TRUEplus Lancets 33G MISC TEST DAILY 06/12/20   Heather Roberts, NP  UNABLE TO FIND True Metrix meter, True Metrix test strips, lancets, control solution, and alcohol swabs. 03/22/20   Freddy Finner, NP  valACYclovir (VALTREX)  1000 MG tablet Take 1 tablet (1,000 mg total) by mouth 3 (three) times daily. 10/03/22   Anabel Halon, MD  zinc oxide 20 % ointment Apply 1 Application topically in the morning and at bedtime. 11/29/22   Anabel Halon, MD    Allergies as of 11/11/2022 - Review Complete 10/03/2022  Allergen Reaction Noted   Metformin and related Other (See Comments) 12/12/2020   Crestor [rosuvastatin] Other (See Comments) 12/12/2020   Shellfish allergy Nausea And Vomiting 05/07/2019    Family History  Problem Relation Age of Onset   Diabetes type II Mother    Cancer Father    Diabetes type II Sister     Social History   Socioeconomic History   Marital status: Divorced    Spouse name: Not on file   Number of children: 3   Years of education: Not on file   Highest education level: 12th grade  Occupational History   Not on file  Tobacco Use   Smoking status: Former    Types: Cigarettes   Smokeless tobacco: Never   Tobacco comments:    2012 quit  Vaping Use   Vaping status: Never Used  Substance and Sexual Activity   Alcohol use: No   Drug use: Yes    Types: Marijuana    Comment: weekly   Sexual activity: Yes  Other Topics Concern   Not on file  Social History Narrative   Lives with fiance' [name]    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       Enjoys: music-jazz, reading-john grisham      Diet: no pork, salads daily, [place], uses sugar free sweeters, dark chocolate 70% or higher   Caffeine: decaff-sodas, ginger ale, tea-with truiva    Water: 3-4 cups       Wears seat belt   Smoke detectors at home   Does not use phone while driving    Social Determinants of Health   Financial Resource Strain: Low Risk  (11/21/2020)   Overall Financial Resource Strain (CARDIA)    Difficulty of Paying Living Expenses: Not very hard  Food Insecurity: No Food Insecurity (11/21/2020)   Hunger Vital Sign    Worried About Running Out of Food in the Last Year: Never true    Ran Out of Food in the Last Year: Never  true  Transportation Needs: No Transportation Needs (11/21/2020)   PRAPARE - Administrator, Civil Service (Medical): No    Lack of Transportation (Non-Medical): No  Physical Activity: Inactive (11/21/2020)   Exercise Vital Sign    Days of Exercise per Week: 0 days    Minutes of Exercise per Session: 0 min  Stress: No Stress Concern Present (11/21/2020)   [employer] of Occupational Health - Occupational Stress Questionnaire    Feeling of Stress : Not at  all  Social Connections: Moderately Isolated (11/21/2020)   Social Connection and Isolation Panel [NHANES]    Frequency of Communication with Friends and Family: More than three times a week    Frequency of Social Gatherings with Friends and Family: Once a week    Attends Religious Services: More than 4 times per year    Active Member of [employer] or Organizations: No    Attends Banker Meetings: Never    Marital Status: Divorced  Catering manager Violence: Not At Risk (11/21/2020)   Humiliation, Afraid, Rape, and Kick questionnaire    Fear of Current or Ex-Partner: No    Emotionally Abused: No    Physically Abused: No    Sexually Abused: No    Review of Systems: See HPI, otherwise negative ROS  Physical Exam: BP 131/78   Pulse (!) 51   Temp 97.6 F (36.4 C) (Oral)   Resp 14   Ht 5\' 9"  (1.753 m)   Wt 90.7 kg   SpO2 100%   BMI 29.53 kg/m  General:   Alert,  Well-developed, well-nourished, pleasant and cooperative in NAD Lungs:  Clear throughout to auscultation.   No wheezes, crackles, or rhonchi. No acute distress. Heart:  Regular rate and rhythm; no murmurs, clicks, rubs,  or gallops. Abdomen:  Soft, nontender and nondistended. No masses, hepatosplenomegaly or hernias noted. Normal bowel sounds, without guarding, and without rebound.   Impression/Plan: ZELIG GACEK is now here to undergo a screening colonoscopy.  Average risk screening examination.  Risks, benefits, limitations, imponderables  and alternatives regarding colonoscopy have been reviewed with the patient. Questions have been answered. All parties agreeable.     Notice:  This dictation was prepared with Dragon dictation along with smaller phrase technology. Any transcriptional errors that result from this process are unintentional and may not be corrected upon review.

## 2022-12-02 NOTE — Anesthesia Postprocedure Evaluation (Signed)
Anesthesia Post Note  Patient: Justin Klein  Procedure(s) Performed: COLONOSCOPY WITH PROPOFOL  Patient location during evaluation: Endoscopy Anesthesia Type: General Level of consciousness: awake and alert Pain management: pain level controlled Vital Signs Assessment: post-procedure vital signs reviewed and stable Respiratory status: spontaneous breathing Cardiovascular status: blood pressure returned to baseline Postop Assessment: no apparent nausea or vomiting Anesthetic complications: no   No notable events documented.   Last Vitals:  Vitals:   12/02/22 0727  BP: 131/78  Pulse: (!) 51  Resp: 14  Temp: 36.4 C  SpO2: 100%    Last Pain:  Vitals:   12/02/22 0817  TempSrc:   PainSc: 0-No pain                 Read Bonelli

## 2022-12-02 NOTE — Discharge Instructions (Addendum)
  Colonoscopy Discharge Instructions  Read the instructions outlined below and refer to this sheet in the next few weeks. These discharge instructions provide you with general information on caring for yourself after you leave the hospital. Your doctor may also give you specific instructions. While your treatment has been planned according to the most current medical practices available, unavoidable complications occasionally occur. If you have any problems or questions after discharge, call Dr. Jena Gauss at [phone]. ACTIVITY You may resume your regular activity, but move at a slower pace for the next 24 hours.  Take frequent rest periods for the next 24 hours.  Walking will help get rid of the air and reduce the bloated feeling in your belly (abdomen).  No driving for 24 hours (because of the medicine (anesthesia) used during the test).   Do not sign any important legal documents or operate any machinery for 24 hours (because of the anesthesia used during the test).  NUTRITION Drink plenty of fluids.  You may resume your normal diet as instructed by your doctor.  Begin with a light meal and progress to your normal diet. Heavy or fried foods are harder to digest and may make you feel sick to your stomach (nauseated).  Avoid alcoholic beverages for 24 hours or as instructed.  MEDICATIONS You may resume your normal medications unless your doctor tells you otherwise.  WHAT YOU CAN EXPECT TODAY Some feelings of bloating in the abdomen.  Passage of more gas than usual.  Spotting of blood in your stool or on the toilet paper.  IF YOU HAD POLYPS REMOVED DURING THE COLONOSCOPY: No aspirin products for 7 days or as instructed.  No alcohol for 7 days or as instructed.  Eat a soft diet for the next 24 hours.  FINDING OUT THE RESULTS OF YOUR TEST Not all test results are available during your visit. If your test results are not back during the visit, make an appointment with your caregiver to find out the  results. Do not assume everything is normal if you have not heard from your caregiver or the medical facility. It is important for you to follow up on all of your test results.  SEEK IMMEDIATE MEDICAL ATTENTION IF: You have more than a spotting of blood in your stool.  Your belly is swollen (abdominal distention).  You are nauseated or vomiting.  You have a temperature over 101.  You have abdominal pain or discomfort that is severe or gets worse throughout the day.       No polyps found today.  Your prep was much better.      Recommend 1 more screening colonoscopy in 10 years     at patient request, I called Ancil Boozer at [phone] -  reviewed findings and recommendations

## 2022-12-02 NOTE — Anesthesia Preprocedure Evaluation (Signed)
Anesthesia Evaluation  Patient identified by MRN, date of birth, ID band Patient awake    Reviewed: Allergy & Precautions, H&P , NPO status , Patient's Chart, lab work & pertinent test results, reviewed documented beta blocker date and time   Airway Mallampati: II  TM Distance: >3 FB Neck ROM: full    Dental no notable dental hx.    Pulmonary neg pulmonary ROS, asthma , former smoker   Pulmonary exam normal breath sounds clear to auscultation       Cardiovascular Exercise Tolerance: Good hypertension, negative cardio ROS  Rhythm:regular Rate:Normal     Neuro/Psych  Neuromuscular disease negative neurological ROS  negative psych ROS   GI/Hepatic negative GI ROS, Neg liver ROS,,,  Endo/Other  negative endocrine ROSdiabetesHypothyroidism    Renal/GU negative Renal ROS  negative genitourinary   Musculoskeletal   Abdominal   Peds  Hematology negative hematology ROS (+) Blood dyscrasia, anemia   Anesthesia Other Findings   Reproductive/Obstetrics negative OB ROS                             Anesthesia Physical Anesthesia Plan  ASA: 3  Anesthesia Plan: General   Post-op Pain Management:    Induction:   PONV Risk Score and Plan: Propofol infusion  Airway Management Planned:   Additional Equipment:   Intra-op Plan:   Post-operative Plan:   Informed Consent: I have reviewed the patients History and Physical, chart, labs and discussed the procedure including the risks, benefits and alternatives for the proposed anesthesia with the patient or authorized representative who has indicated his/her understanding and acceptance.     Dental Advisory Given  Plan Discussed with: CRNA  Anesthesia Plan Comments:        Anesthesia Quick Evaluation

## 2022-12-02 NOTE — Transfer of Care (Signed)
Immediate Anesthesia Transfer of Care Note  Patient: Justin Klein  Procedure(s) Performed: COLONOSCOPY WITH PROPOFOL  Patient Location: Endoscopy Unit  Anesthesia Type:General  Level of Consciousness: awake  Airway & Oxygen Therapy: Patient Spontanous Breathing  Post-op Assessment: Report given to RN  Post vital signs: Reviewed and stable  Last Vitals:  Vitals Value Taken Time  BP    Temp    Pulse    Resp    SpO2      Last Pain:  Vitals:   12/02/22 0817  TempSrc:   PainSc: 0-No pain      Patients Stated Pain Goal: 4 (12/02/22 0711)  Complications: No notable events documented.

## 2022-12-02 NOTE — Op Note (Signed)
[facility] Patient Name: Justin Klein Procedure Date: 12/02/2022 8:02 AM MRN: [id number] Date of Birth: [birth year] Attending MD: Gennette Pac , MD, [id number] CSN: [id number] Age: 62 Admit Type: Outpatient Procedure:                Colonoscopy Indications:              Screening for colorectal malignant neoplasm Providers:                Gennette Pac, MD, Sheran Fava,                            Kristine L. Jessee Avers, Technician Referring MD:              Medicines:                Propofol per Anesthesia Complications:            No immediate complications. Estimated Blood Loss:     Estimated blood loss: none. Procedure:                Pre-Anesthesia Assessment:                           - Prior to the procedure, a History and Physical                            was performed, and patient medications and                            allergies were reviewed. The patient's tolerance of                            previous anesthesia was also reviewed. The risks                            and benefits of the procedure and the sedation                            options and risks were discussed with the patient.                            All questions were answered, and informed consent                            was obtained. Prior Anticoagulants: The patient has                            taken no anticoagulant or antiplatelet agents. ASA                            Grade Assessment: II - A patient with mild systemic                            disease. After reviewing the risks and benefits,  the patient was deemed in satisfactory condition to                            undergo the procedure.                           After obtaining informed consent, the colonoscope                            was passed under direct vision. Throughout the                            procedure, the patient's blood pressure, pulse, and                             oxygen saturations were monitored continuously. The                            [id number] scope was introduced through the                            anus and advanced to the the cecum, identified by                            appendiceal orifice and ileocecal valve. The                            colonoscopy was performed without difficulty. The                            patient tolerated the procedure well. The quality                            of the bowel preparation was adequate. The                            ileocecal valve, appendiceal orifice, and rectum                            were photographed. The entire colon was well                            visualized. Scope In: 8:21:00 AM Scope Out: 8:36:27 AM Scope Withdrawal Time: 0 hours 6 minutes 59 seconds  Total Procedure Duration: 0 hours 15 minutes 27 seconds  Findings:      The perianal and digital rectal examinations were normal.      Non-bleeding internal hemorrhoids were found during retroflexion. The       hemorrhoids were mild, small and Grade I (internal hemorrhoids that do       not prolapse).      A diffuse area of mild melanosis was found in the entire colon.      The exam was otherwise without abnormality on direct and retroflexion       views. Impression:               -  Non-bleeding internal hemorrhoids.                           - Melanosis in the colon.                           - The examination was otherwise normal on direct                            and retroflexion views.                           - No specimens collected. Moderate Sedation:      Moderate (conscious) sedation was personally administered by an       anesthesia professional. The following parameters were monitored: oxygen       saturation, heart rate, blood pressure, respiratory rate, EKG, adequacy       of pulmonary ventilation, and response to care. Recommendation:           - Patient has a contact number available  for                            emergencies. The signs and symptoms of potential                            delayed complications were discussed with the                            patient. Return to normal activities tomorrow.                            Written discharge instructions were provided to the                            patient.                           - Advance diet as tolerated.                           - Continue present medications.                           - Repeat colonoscopy in 10 years for screening                            purposes.                           - Return to GI office (date not yet determined). Procedure Code(s):        --- Professional ---                           [phone], Colonoscopy, flexible; diagnostic, including                            collection of specimen(s) by brushing  or washing,                            when performed (separate procedure) Diagnosis Code(s):        --- Professional ---                           Z12.11, Encounter for screening for malignant                            neoplasm of colon                           K63.89, Other specified diseases of intestine                           K64.0, First degree hemorrhoids CPT copyright 2022 [employer]. All rights reserved. The codes documented in this report are preliminary and upon coder review may  be revised to meet current compliance requirements. [name]. [name], MD Gennette Pac, MD 12/02/2022 8:46:30 AM This report has been signed electronically. Number of Addenda: 0

## 2022-12-10 ENCOUNTER — Encounter (HOSPITAL_COMMUNITY): Payer: Self-pay | Admitting: Internal Medicine

## 2022-12-11 ENCOUNTER — Other Ambulatory Visit: Payer: Self-pay | Admitting: Internal Medicine

## 2022-12-11 DIAGNOSIS — E1140 Type 2 diabetes mellitus with diabetic neuropathy, unspecified: Secondary | ICD-10-CM

## 2022-12-16 ENCOUNTER — Encounter: Payer: Self-pay | Admitting: Internal Medicine

## 2022-12-17 NOTE — Telephone Encounter (Signed)
Scheduled 01/20/23

## 2022-12-18 ENCOUNTER — Other Ambulatory Visit: Payer: Self-pay | Admitting: Internal Medicine

## 2022-12-18 DIAGNOSIS — E039 Hypothyroidism, unspecified: Secondary | ICD-10-CM

## 2023-01-05 ENCOUNTER — Other Ambulatory Visit: Payer: Self-pay | Admitting: Internal Medicine

## 2023-01-05 ENCOUNTER — Encounter: Payer: Self-pay | Admitting: Internal Medicine

## 2023-01-05 DIAGNOSIS — N529 Male erectile dysfunction, unspecified: Secondary | ICD-10-CM

## 2023-01-06 ENCOUNTER — Other Ambulatory Visit: Payer: Self-pay

## 2023-01-06 DIAGNOSIS — N529 Male erectile dysfunction, unspecified: Secondary | ICD-10-CM

## 2023-01-06 MED ORDER — SILDENAFIL CITRATE 100 MG PO TABS: ORAL_TABLET | ORAL | 0 refills | Status: DC

## 2023-01-08 ENCOUNTER — Other Ambulatory Visit: Payer: Self-pay | Admitting: Internal Medicine

## 2023-01-08 DIAGNOSIS — E782 Mixed hyperlipidemia: Secondary | ICD-10-CM

## 2023-01-13 ENCOUNTER — Ambulatory Visit: Payer: Medicare HMO | Admitting: Internal Medicine

## 2023-01-15 DIAGNOSIS — E1140 Type 2 diabetes mellitus with diabetic neuropathy, unspecified: Secondary | ICD-10-CM | POA: Diagnosis not present

## 2023-01-15 DIAGNOSIS — E559 Vitamin D deficiency, unspecified: Secondary | ICD-10-CM | POA: Diagnosis not present

## 2023-01-15 DIAGNOSIS — E782 Mixed hyperlipidemia: Secondary | ICD-10-CM | POA: Diagnosis not present

## 2023-01-16 LAB — CMP14+EGFR
ALT: 11 [IU]/L (ref 0–44)
AST: 21 [IU]/L (ref 0–40)
Albumin: 4.6 g/dL (ref 3.9–4.9)
Alkaline Phosphatase: 91 [IU]/L (ref 44–121)
BUN/Creatinine Ratio: 15 (ref 10–24)
BUN: 15 mg/dL (ref 8–27)
Bilirubin Total: 0.7 mg/dL (ref 0.0–1.2)
CO2: 23 mmol/L (ref 20–29)
Calcium: 10.5 mg/dL — ABNORMAL HIGH (ref 8.6–10.2)
Chloride: 103 mmol/L (ref 96–106)
Creatinine, Ser: 0.98 mg/dL (ref 0.76–1.27)
Globulin, Total: 2.9 g/dL (ref 1.5–4.5)
Glucose: 86 mg/dL (ref 70–99)
Potassium: 4.4 mmol/L (ref 3.5–5.2)
Sodium: 140 mmol/L (ref 134–144)
Total Protein: 7.5 g/dL (ref 6.0–8.5)
eGFR: 87 mL/min/{1.73_m2} (ref >59)

## 2023-01-16 LAB — HEMOGLOBIN A1C
Est. average glucose Bld gHb Est-mCnc: 128 mg/dL
Hgb A1c MFr Bld: 6.1 % — ABNORMAL HIGH (ref 4.8–5.6)

## 2023-01-16 LAB — LIPID PANEL
Chol/HDL Ratio: 2.8 {ratio} (ref 0.0–5.0)
Cholesterol, Total: 133 mg/dL (ref 100–199)
HDL: 48 mg/dL (ref >39)
LDL Chol Calc (NIH): 74 mg/dL (ref 0–99)
Triglycerides: 50 mg/dL (ref 0–149)
VLDL Cholesterol Cal: 11 mg/dL (ref 5–40)

## 2023-01-16 LAB — VITAMIN D 25 HYDROXY (VIT D DEFICIENCY, FRACTURES): Vit D, 25-Hydroxy: 36.7 ng/mL (ref 30.0–100.0)

## 2023-01-20 ENCOUNTER — Ambulatory Visit (INDEPENDENT_AMBULATORY_CARE_PROVIDER_SITE_OTHER): Payer: Medicare HMO | Admitting: Internal Medicine

## 2023-01-20 ENCOUNTER — Encounter: Payer: Self-pay | Admitting: Internal Medicine

## 2023-01-20 VITALS — BP 103/67 | HR 66 | Ht 69.0 in | Wt 198.2 lb

## 2023-01-20 DIAGNOSIS — E1140 Type 2 diabetes mellitus with diabetic neuropathy, unspecified: Secondary | ICD-10-CM | POA: Diagnosis not present

## 2023-01-20 DIAGNOSIS — I471 Supraventricular tachycardia, unspecified: Secondary | ICD-10-CM

## 2023-01-20 DIAGNOSIS — E039 Hypothyroidism, unspecified: Secondary | ICD-10-CM

## 2023-01-20 DIAGNOSIS — Z7985 Long-term (current) use of injectable non-insulin antidiabetic drugs: Secondary | ICD-10-CM | POA: Diagnosis not present

## 2023-01-20 DIAGNOSIS — J4520 Mild intermittent asthma, uncomplicated: Secondary | ICD-10-CM | POA: Diagnosis not present

## 2023-01-20 DIAGNOSIS — Z125 Encounter for screening for malignant neoplasm of prostate: Secondary | ICD-10-CM

## 2023-01-20 DIAGNOSIS — E782 Mixed hyperlipidemia: Secondary | ICD-10-CM

## 2023-01-20 DIAGNOSIS — K219 Gastro-esophageal reflux disease without esophagitis: Secondary | ICD-10-CM | POA: Insufficient documentation

## 2023-01-20 DIAGNOSIS — I10 Essential (primary) hypertension: Secondary | ICD-10-CM

## 2023-01-20 DIAGNOSIS — R269 Unspecified abnormalities of gait and mobility: Secondary | ICD-10-CM | POA: Diagnosis not present

## 2023-01-20 DIAGNOSIS — M5136 Other intervertebral disc degeneration, lumbar region with discogenic back pain only: Secondary | ICD-10-CM

## 2023-01-20 MED ORDER — FAMOTIDINE 20 MG PO TABS
20.0000 mg | ORAL_TABLET | Freq: Every day | ORAL | 3 refills | Status: DC
Start: 2023-01-20 — End: 2023-05-12

## 2023-01-20 MED ORDER — ALBUTEROL SULFATE HFA 108 (90 BASE) MCG/ACT IN AERS: INHALATION_SPRAY | RESPIRATORY_TRACT | 1 refills | Status: AC

## 2023-01-20 NOTE — Assessment & Plan Note (Addendum)
BP Readings from Last 1 Encounters:  01/20/23 103/67   Well-controlled  Has Diltiazem for PSVT Counseled for compliance with the medications Advised DASH diet and moderate exercise/walking, at least 150 mins/week

## 2023-01-20 NOTE — Patient Instructions (Addendum)
Please take Pepcid for acid reflux.  Please continue to take medications as prescribed.  Please continue to follow low carb diet and perform moderate exercise/walking at least 150 mins/week.  Please get fasting blood tests done before the next visit.

## 2023-01-20 NOTE — Assessment & Plan Note (Signed)
Lab Results  Component Value Date   TSH 3.450 10/01/2022   On levothyroxine 150 mcg daily

## 2023-01-20 NOTE — Assessment & Plan Note (Signed)
Lab Results  Component Value Date   HGBA1C 6.1 (H) 01/15/2023   Well controlled On Trulicity 3 mg dose currently, refills sent to specialty pharmacy as per patient request -[facility] application approved Advised to follow diabetic diet On pravastatin F/u CMP and lipid panel Diabetic eye exam: Advised to follow up with Ophthalmology for diabetic eye exam  Has recurrent falls likely due to diabetic neuropathy, had started gabapentin 100 mg nightly -increased dose to 300 mg nightly later Referred to neurology for diabetic neuropathy

## 2023-01-20 NOTE — Assessment & Plan Note (Signed)
Asymptomatic currently ?Has diltiazem as needed, as has not required it for a long time ?

## 2023-01-20 NOTE — Progress Notes (Unsigned)
Established Patient Office Visit  Subjective:  Patient ID: Justin Klein, male    DOB: [birth year]  Age: 61 y.o. MRN: [id number]  CC:  Chief Complaint  Patient presents with   Diabetes    Four month follow up    balance     Balance issue, having to use the cane more often     HPI Justin Klein is a 62 y.o. male with past medical history of type II DM, hypothyroidism, HLD and PSVT who presents for f/u of his chronic medical conditions.  Type II DM:  His HbA1c is 6.1 now. He has been taking Trulicity 3 mg qw for it, gets refills from patient assistance program. He denies any polyuria or polydipsia currently.  He takes Pravastatin for HLD. He denies any chest pain or dyspnea currently.  Diabetic neuropathy: He has history of diabetic neuropathy and has been having gait disturbance. He has had recurrent falls as well.  He was placed on gabapentin 300 mg nightly in the last visit, but has not seen much improvement.  Denies any major injury. He also has history of pernicious anemia, and has taken B12 oral supplements.   He has history of PSVT, but has not had any dizziness or palpitations recently.  He has diltiazem as needed for palpitations.  He reports epigastric and lower chest wall discomfort at times.  Denies any nausea or vomiting currently.  Denies any change in bowel habits recently.  He reports chronic low back pain, sharp, intermittent, radiating to right hip area and worse with movement.  He has chronic numbness of the feet, likely from diabetic neuropathy.  Denies saddle anesthesia, urinary or stool incontinence.   Past Medical History:  Diagnosis Date   Asthma    Asthma 08/26/2011   Bilateral leg weakness 07/21/2012   Diabetes mellitus, type 2 (HCC)    Essential hypertension 10/27/2019   Hypothyroidism    Ingrown nail of great toe of left foot 05/11/2019   Numbness and tingling of both legs 07/21/2012   Pernicious anemia    PSVT (paroxysmal supraventricular tachycardia)  (HCC) 08/26/2011   Pulmonary embolus (HCC)    Diagnosed 9/12 at [place]   Pulmonary embolus (HCC) 08/26/2011   Seborrheic dermatitis    Strain of muscle, fascia and tendon of lower back, initial encounter 01/24/2020   SVT (supraventricular tachycardia) (HCC)    Wart on thumb 08/20/2019    Past Surgical History:  Procedure Laterality Date   COLONOSCOPY WITH PROPOFOL N/A 09/27/2021   Procedure: COLONOSCOPY WITH PROPOFOL;  Surgeon: Corbin Ade, MD;  Location: [facility];  Service: Endoscopy;  Laterality: N/A;  9:30am   COLONOSCOPY WITH PROPOFOL N/A 12/02/2022   Procedure: COLONOSCOPY WITH PROPOFOL;  Surgeon: Corbin Ade, MD;  Location: [facility];  Service: Endoscopy;  Laterality: N/A;  8:15 AM, ASA 2   Epidermal cyst resection     Left patella tendon repair      Family History  Problem Relation Age of Onset   Diabetes type II Mother    Cancer Father    Diabetes type II Sister     Social History   Socioeconomic History   Marital status: Divorced    Spouse name: Not on file   Number of children: 3   Years of education: Not on file   Highest education level: 12th grade  Occupational History   Not on file  Tobacco Use   Smoking status: Former    Types: Cigarettes  Smokeless tobacco: Never   Tobacco comments:    2012 quit  Vaping Use   Vaping status: Never Used  Substance and Sexual Activity   Alcohol use: No   Drug use: Yes    Types: Marijuana    Comment: weekly   Sexual activity: Yes  Other Topics Concern   Not on file  Social History Narrative   Lives with fiance' [name]    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       Enjoys: music-jazz, reading-john grisham      Diet: no pork, salads daily, [place], uses sugar free sweeters, dark chocolate 70% or higher   Caffeine: decaff-sodas, ginger ale, tea-with truiva    Water: 3-4 cups       Wears seat belt   Smoke detectors at home   Does not use phone while driving    Social Determinants of Health   Financial Resource  Strain: Low Risk  (11/21/2020)   Overall Financial Resource Strain (CARDIA)    Difficulty of Paying Living Expenses: Not very hard  Food Insecurity: No Food Insecurity (11/21/2020)   Hunger Vital Sign    Worried About Running Out of Food in the Last Year: Never true    Ran Out of Food in the Last Year: Never true  Transportation Needs: No Transportation Needs (11/21/2020)   PRAPARE - Administrator, Civil Service (Medical): No    Lack of Transportation (Non-Medical): No  Physical Activity: Inactive (11/21/2020)   Exercise Vital Sign    Days of Exercise per Week: 0 days    Minutes of Exercise per Session: 0 min  Stress: No Stress Concern Present (11/21/2020)   [employer] of Occupational Health - Occupational Stress Questionnaire    Feeling of Stress : Not at all  Social Connections: Moderately Isolated (11/21/2020)   Social Connection and Isolation Panel [NHANES]    Frequency of Communication with Friends and Family: More than three times a week    Frequency of Social Gatherings with Friends and Family: Once a week    Attends Religious Services: More than 4 times per year    Active Member of [employer] or Organizations: No    Attends Banker Meetings: Never    Marital Status: Divorced  Catering manager Violence: Not At Risk (11/21/2020)   Humiliation, Afraid, Rape, and Kick questionnaire    Fear of Current or Ex-Partner: No    Emotionally Abused: No    Physically Abused: No    Sexually Abused: No    Outpatient Medications Prior to Visit  Medication Sig Dispense Refill   Alcohol Swabs (B-D SINGLE USE SWABS REGULAR) PADS USE AS DIRECTED 100 each 0   Cholecalciferol (VITAMIN D-3) 25 MCG (1000 UT) CAPS Take 1,000 Units by mouth daily.     Cyanocobalamin (B-12) 1000 MCG CAPS Take 1,000 mcg by mouth daily.     cyclobenzaprine (FLEXERIL) 5 MG tablet Take 1 tablet (5 mg total) by mouth 2 (two) times daily as needed. 30 tablet 1   diltiazem (CARDIZEM) 30 MG tablet  TAKE 1 TABLET EVERY DAY 90 tablet 3   fluticasone (CUTIVATE) 0.05 % cream APPLY TO AFFECTED AREAS ON FACE UP TO TWICE DAILY AS NEEDED FOR RASH. 60 g 11   fluticasone furoate-vilanterol (BREO ELLIPTA) 100-25 MCG/INH AEPB Inhale 1 puff into the lungs daily. (Patient taking differently: Inhale 1 puff into the lungs daily as needed (asthma).) 90 each 2   gabapentin (NEURONTIN) 300 MG capsule Take 1 capsule (300 mg total) by mouth  at bedtime. 90 capsule 1   glucose blood (TRUE METRIX BLOOD GLUCOSE TEST) test strip TEST DAILY 100 strip 3   hydrOXYzine (ATARAX) 10 MG tablet Take 1 tablet (10 mg total) by mouth 2 (two) times daily as needed for itching. 30 tablet 0   levothyroxine (SYNTHROID) 150 MCG tablet TAKE 1 TABLET EVERY MORNING 90 tablet 3   Multiple Vitamins-Minerals (MULTIVITAMIN WITH MINERALS) tablet Take 1 tablet by mouth daily.     Omega-3 Fatty Acids (FISH OIL) 1000 MG CPDR Take 1,000 mg by mouth daily.     pravastatin (PRAVACHOL) 20 MG tablet TAKE 1 TABLET BY MOUTH DAILY. 90 tablet 3   sildenafil (VIAGRA) 100 MG tablet TAKE 1 TABLET BY MOUTH AS NEEDED FOR ERECTILE DYSFUNCTION 20 tablet 0   TRUEplus Lancets 33G MISC TEST DAILY 100 each 0   TRULICITY 3 MG/0.5ML SOPN INJECT 3 MG (0.5 ML) UNDER THE SKIN ONCE A WEEK 8 mL 0   UNABLE TO FIND True Metrix meter, True Metrix test strips, lancets, control solution, and alcohol swabs. 1 Product 0   valACYclovir (VALTREX) 1000 MG tablet Take 1 tablet (1,000 mg total) by mouth 3 (three) times daily. 21 tablet 0   zinc oxide 20 % ointment Apply 1 Application topically in the morning and at bedtime. 56.7 g 0   albuterol (VENTOLIN HFA) 108 (90 Base) MCG/ACT inhaler INHALE 1 OR 2 PUFFS INTO THE LUNGS EVERY SIX HOURS AS NEEDED FOR WHEEZING OR SHORTNESS OF BREATH. 8.5 g 0   tamsulosin (FLOMAX) 0.4 MG CAPS capsule Take 1 capsule (0.4 mg total) by mouth daily. 30 capsule 3   No facility-administered medications prior to visit.    Allergies  Allergen Reactions    Metformin And Related Other (See Comments)    Low B12, gait disturbance, myopathy, neuropathy, pernicious anemia    Crestor [Rosuvastatin] Other (See Comments)    "Made me feel sluggish and drained" Planning to retry on 12/14/20   Shellfish Allergy Nausea And Vomiting    Seafood-upset stomach    ROS Review of Systems  Constitutional:  Negative for chills and fever.  HENT:  Negative for congestion and sore throat.   Eyes:  Negative for pain and discharge.  Respiratory:  Negative for cough and shortness of breath.   Cardiovascular:  Negative for chest pain and palpitations.  Gastrointestinal:  Negative for constipation, diarrhea, nausea and vomiting.  Endocrine: Negative for polydipsia and polyuria.  Genitourinary:  Negative for dysuria and hematuria.  Musculoskeletal:  Positive for back pain. Negative for neck pain and neck stiffness.  Skin:  Negative for rash.  Neurological:  Positive for numbness (B/l feet). Negative for dizziness, weakness and headaches.  Psychiatric/Behavioral:  Negative for agitation and behavioral problems.       Objective:    Physical Exam Vitals reviewed.  Constitutional:      General: He is not in acute distress.    Appearance: He is not diaphoretic.  HENT:     Head: Normocephalic and atraumatic.     Nose: Nose normal.     Mouth/Throat:     Mouth: Mucous membranes are moist.  Eyes:     General: No scleral icterus.    Extraocular Movements: Extraocular movements intact.  Cardiovascular:     Rate and Rhythm: Normal rate and regular rhythm.     Heart sounds: Normal heart sounds. No murmur heard. Pulmonary:     Breath sounds: Normal breath sounds. No wheezing or rales.  Musculoskeletal:     Cervical  back: Neck supple. No tenderness.     Lumbar back: Tenderness (Lower lumbar and right paraspinal area) present. Negative right straight leg raise test and negative left straight leg raise test.     Right lower leg: No edema.     Left lower leg: No  edema.  Skin:    General: Skin is warm.     Findings: No rash.  Neurological:     General: No focal deficit present.     Mental Status: He is alert and oriented to person, place, and time.     Sensory: Sensory deficit (B/l feet) present.     Motor: No weakness.  Psychiatric:        Mood and Affect: Mood normal.        Behavior: Behavior normal.     BP 103/67 (BP Location: Left Arm, Patient Position: Sitting, Cuff Size: Large)   Pulse 66   Ht 5\' 9"  (1.753 m)   Wt 198 lb 3.2 oz (89.9 kg)   SpO2 96%   BMI 29.27 kg/m  Wt Readings from Last 3 Encounters:  01/20/23 198 lb 3.2 oz (89.9 kg)  12/02/22 200 lb (90.7 kg)  10/03/22 199 lb (90.3 kg)    Lab Results  Component Value Date   TSH 3.450 10/01/2022   Lab Results  Component Value Date   WBC 3.8 10/01/2022   HGB 13.0 10/01/2022   HCT 40.5 10/01/2022   MCV 90 10/01/2022   PLT 212 10/01/2022   Lab Results  Component Value Date   NA 140 01/15/2023   K 4.4 01/15/2023   CO2 23 01/15/2023   GLUCOSE 86 01/15/2023   BUN 15 01/15/2023   CREATININE 0.98 01/15/2023   BILITOT 0.7 01/15/2023   ALKPHOS 91 01/15/2023   AST 21 01/15/2023   ALT 11 01/15/2023   PROT 7.5 01/15/2023   ALBUMIN 4.6 01/15/2023   CALCIUM 10.5 (H) 01/15/2023   EGFR 87 01/15/2023   Lab Results  Component Value Date   CHOL 133 01/15/2023   Lab Results  Component Value Date   HDL 48 01/15/2023   Lab Results  Component Value Date   LDLCALC 74 01/15/2023   Lab Results  Component Value Date   TRIG 50 01/15/2023   Lab Results  Component Value Date   CHOLHDL 2.8 01/15/2023   Lab Results  Component Value Date   HGBA1C 6.1 (H) 01/15/2023      Assessment & Plan:   Problem List Items Addressed This Visit       Cardiovascular and Mediastinum   Essential hypertension (Chronic)    BP Readings from Last 1 Encounters:  01/20/23 103/67   Well-controlled  Has Diltiazem for PSVT Counseled for compliance with the medications Advised DASH  diet and moderate exercise/walking, at least 150 mins/week      Relevant Orders   CBC with Differential/Platelet   CMP14+EGFR   PSVT (paroxysmal supraventricular tachycardia) (HCC)    Asymptomatic currently Has diltiazem as needed, as has not required it for a long time      Relevant Orders   CBC with Differential/Platelet   CMP14+EGFR   TSH + free T4     Respiratory   Asthma (Chronic)    Well-controlled with albuterol as needed Also has Breo, but uses it inconsistently      Relevant Medications   albuterol (VENTOLIN HFA) 108 (90 Base) MCG/ACT inhaler     Digestive   Gastroesophageal reflux disease    Epigastric discomfort likely due to GERD  Added Pepcid 20 mg QD      Relevant Medications   famotidine (PEPCID) 20 MG tablet     Endocrine   Type 2 diabetes mellitus with diabetic neuropathy, without long-term current use of insulin (HCC) - Primary (Chronic)    Lab Results  Component Value Date   HGBA1C 6.1 (H) 01/15/2023   Well controlled On Trulicity 3 mg dose currently, refills sent to specialty pharmacy as per patient request -[facility] application approved Advised to follow diabetic diet On pravastatin F/u CMP and lipid panel Diabetic eye exam: Advised to follow up with Ophthalmology for diabetic eye exam  Has recurrent falls likely due to diabetic neuropathy, had started gabapentin 100 mg nightly -increased dose to 300 mg nightly later Referred to neurology for diabetic neuropathy      Relevant Orders   CMP14+EGFR   Hemoglobin A1c   Ambulatory referral to Neurology   Hypothyroidism (Chronic)    Lab Results  Component Value Date   TSH 3.450 10/01/2022   On levothyroxine 150 mcg daily      Relevant Orders   TSH + free T4     Musculoskeletal and Integument   DDD (degenerative disc disease), lumbar    Chronic low back pain, likely due to DDD of lumbar spine Has had mild relief from Tylenol Flexeril as needed for muscle spasms Heating pad and/or  back brace as needed Avoid heavy lifting and frequent bending If  persistent, will get imaging        Other   Hyperlipidemia (Chronic)    Was given statin in the past, but felt sluggish with it Tried Crestor, but felt sluggish, switched to pravastatin Advised to follow low carb, low-cholesterol diet for now      Gait disturbance    Likely due to diabetic neuropathy On gabapentin currently Refer to neurology      Relevant Orders   Ambulatory referral to Neurology   Prostate cancer screening    Ordered PSA after discussing its limitations for prostate cancer screening, including false positive results leading to additional investigations.      Relevant Orders   PSA    Meds ordered this encounter  Medications   famotidine (PEPCID) 20 MG tablet    Sig: Take 1 tablet (20 mg total) by mouth daily.    Dispense:  30 tablet    Refill:  3   albuterol (VENTOLIN HFA) 108 (90 Base) MCG/ACT inhaler    Sig: INHALE 1 OR 2 PUFFS INTO THE LUNGS EVERY SIX HOURS AS NEEDED FOR WHEEZING OR SHORTNESS OF BREATH.    Dispense:  18 g    Refill:  1    Ok to sub for generic or preferred albuterol product on patient's insurance.    Follow-up: Return in about 4 months (around 05/20/2023) for DM and neuropathy.    Anabel Halon, MD

## 2023-01-20 NOTE — Assessment & Plan Note (Signed)
Was given statin in the past, but felt sluggish with it Tried Crestor, but felt sluggish, switched to pravastatin Advised to follow low carb, low-cholesterol diet for now

## 2023-01-21 ENCOUNTER — Ambulatory Visit: Payer: Medicare HMO | Admitting: Internal Medicine

## 2023-01-21 DIAGNOSIS — Z125 Encounter for screening for malignant neoplasm of prostate: Secondary | ICD-10-CM | POA: Insufficient documentation

## 2023-01-21 NOTE — Assessment & Plan Note (Signed)
Likely due to diabetic neuropathy On gabapentin currently Refer to neurology

## 2023-01-21 NOTE — Assessment & Plan Note (Signed)
Well-controlled with albuterol as needed Also has Breo, but uses it inconsistently

## 2023-01-21 NOTE — Assessment & Plan Note (Signed)
Chronic low back pain, likely due to DDD of lumbar spine Has had mild relief from Tylenol Flexeril as needed for muscle spasms Heating pad and/or back brace as needed Avoid heavy lifting and frequent bending If  persistent, will get imaging

## 2023-01-21 NOTE — Assessment & Plan Note (Addendum)
Ordered PSA after discussing its limitations for prostate cancer screening, including false positive results leading to additional investigations. 

## 2023-01-21 NOTE — Assessment & Plan Note (Signed)
Epigastric discomfort likely due to GERD Added Pepcid 20 mg QD

## 2023-01-22 ENCOUNTER — Ambulatory Visit (INDEPENDENT_AMBULATORY_CARE_PROVIDER_SITE_OTHER): Payer: Medicare HMO

## 2023-01-22 VITALS — Ht 69.0 in | Wt 195.0 lb

## 2023-01-22 DIAGNOSIS — Z0000 Encounter for general adult medical examination without abnormal findings: Secondary | ICD-10-CM

## 2023-01-22 NOTE — Progress Notes (Signed)
Because this visit was a virtual/telehealth visit,  certain criteria was not obtained, such a blood pressure, CBG if applicable, and timed get up and go. Any medications not marked as "taking" were not mentioned during the medication reconciliation part of the visit. Any vitals not documented were not able to be obtained due to this being a telehealth visit or patient was unable to self-report a recent blood pressure reading due to a lack of equipment at home via telehealth. Vitals that have been documented are verbally provided by the patient.   Subjective:   Justin Klein is a 62 y.o. male who presents for Medicare Annual/Subsequent preventive examination.  Visit Complete: Virtual I connected with  Justin Klein on 01/22/23 by a audio enabled telemedicine application and verified that I am speaking with the correct person using two identifiers.  Patient Location: Home  Provider Location: Home Office  I discussed the limitations of evaluation and management by telemedicine. The patient expressed understanding and agreed to proceed.  Vital Signs: Because this visit was a virtual/telehealth visit, some criteria may be missing or patient reported. Any vitals not documented were not able to be obtained and vitals that have been documented are patient reported.  Patient Medicare AWV questionnaire was completed by the patient on na; I have confirmed that all information answered by patient is correct and no changes since this date.  Cardiac Risk Factors include: advanced age (>12men, >75 women);diabetes mellitus;dyslipidemia;hypertension;male gender;obesity (BMI >30kg/m2);sedentary lifestyle     Objective:    Today's Vitals   01/22/23 0846  Weight: 195 lb (88.5 kg)  Height: 5\' 9"  (1.753 m)   Body mass index is 28.8 kg/m.     01/22/2023    8:50 AM 12/02/2022    7:06 AM 09/27/2021    8:12 AM 11/21/2020    1:27 PM  Advanced Directives  Does Patient Have a Medical Advance Directive?  No No No No  Would patient like information on creating a medical advance directive? No - Patient declined No - Patient declined No - Patient declined     Current Medications (verified) Outpatient Encounter Medications as of 01/22/2023  Medication Sig   albuterol (VENTOLIN HFA) 108 (90 Base) MCG/ACT inhaler INHALE 1 OR 2 PUFFS INTO THE LUNGS EVERY SIX HOURS AS NEEDED FOR WHEEZING OR SHORTNESS OF BREATH.   Alcohol Swabs (B-D SINGLE USE SWABS REGULAR) PADS USE AS DIRECTED   Cholecalciferol (VITAMIN D-3) 25 MCG (1000 UT) CAPS Take 1,000 Units by mouth daily.   Cyanocobalamin (B-12) 1000 MCG CAPS Take 1,000 mcg by mouth daily.   diltiazem (CARDIZEM) 30 MG tablet TAKE 1 TABLET EVERY DAY   famotidine (PEPCID) 20 MG tablet Take 1 tablet (20 mg total) by mouth daily.   fluticasone (CUTIVATE) 0.05 % cream APPLY TO AFFECTED AREAS ON FACE UP TO TWICE DAILY AS NEEDED FOR RASH.   fluticasone furoate-vilanterol (BREO ELLIPTA) 100-25 MCG/INH AEPB Inhale 1 puff into the lungs daily. (Patient taking differently: Inhale 1 puff into the lungs daily as needed (asthma).)   gabapentin (NEURONTIN) 300 MG capsule Take 1 capsule (300 mg total) by mouth at bedtime.   glucose blood (TRUE METRIX BLOOD GLUCOSE TEST) test strip TEST DAILY   levothyroxine (SYNTHROID) 150 MCG tablet TAKE 1 TABLET EVERY MORNING   Multiple Vitamins-Minerals (MULTIVITAMIN WITH MINERALS) tablet Take 1 tablet by mouth daily.   Omega-3 Fatty Acids (FISH OIL) 1000 MG CPDR Take 1,000 mg by mouth daily.   pravastatin (PRAVACHOL) 20 MG tablet TAKE 1  TABLET BY MOUTH DAILY.   sildenafil (VIAGRA) 100 MG tablet TAKE 1 TABLET BY MOUTH AS NEEDED FOR ERECTILE DYSFUNCTION   TRUEplus Lancets 33G MISC TEST DAILY   TRULICITY 3 MG/0.5ML SOPN INJECT 3 MG (0.5 ML) UNDER THE SKIN ONCE A WEEK   UNABLE TO FIND True Metrix meter, True Metrix test strips, lancets, control solution, and alcohol swabs.   cyclobenzaprine (FLEXERIL) 5 MG tablet Take 1 tablet (5 mg total)  by mouth 2 (two) times daily as needed. (Patient not taking: Reported on 01/22/2023)   hydrOXYzine (ATARAX) 10 MG tablet Take 1 tablet (10 mg total) by mouth 2 (two) times daily as needed for itching. (Patient not taking: Reported on 01/22/2023)   valACYclovir (VALTREX) 1000 MG tablet Take 1 tablet (1,000 mg total) by mouth 3 (three) times daily. (Patient not taking: Reported on 01/22/2023)   zinc oxide 20 % ointment Apply 1 Application topically in the morning and at bedtime. (Patient not taking: Reported on 01/22/2023)   No facility-administered encounter medications on file as of 01/22/2023.    Allergies (verified) Metformin and related, Crestor [rosuvastatin], and Shellfish allergy   History: Past Medical History:  Diagnosis Date   Asthma    Asthma 08/26/2011   Bilateral leg weakness 07/21/2012   Diabetes mellitus, type 2 (HCC)    Essential hypertension 10/27/2019   Hypothyroidism    Ingrown nail of great toe of left foot 05/11/2019   Numbness and tingling of both legs 07/21/2012   Pernicious anemia    PSVT (paroxysmal supraventricular tachycardia) (HCC) 08/26/2011   Pulmonary embolus (HCC)    Diagnosed 9/12 at [facility]   Pulmonary embolus (HCC) 08/26/2011   Seborrheic dermatitis    Strain of muscle, fascia and tendon of lower back, initial encounter 01/24/2020   SVT (supraventricular tachycardia) (HCC)    Wart on thumb 08/20/2019   Past Surgical History:  Procedure Laterality Date   COLONOSCOPY WITH PROPOFOL N/A 09/27/2021   Procedure: COLONOSCOPY WITH PROPOFOL;  Surgeon: Corbin Ade, MD;  [facility];  Service: Endoscopy;  Laterality: N/A;  9:30am   COLONOSCOPY WITH PROPOFOL N/A 12/02/2022   Procedure: COLONOSCOPY WITH PROPOFOL;  Surgeon: Corbin Ade, MD;  Location: [facility];  Service: Endoscopy;  Laterality: N/A;  8:15 AM, ASA 2   Epidermal cyst resection     Left patella tendon repair     Family History  Problem Relation Age of Onset   Diabetes type II  Mother    Cancer Father    Diabetes type II Sister    Social History   Socioeconomic History   Marital status: Divorced    Spouse name: Not on file   Number of children: 3   Years of education: Not on file   Highest education level: 12th grade  Occupational History   Not on file  Tobacco Use   Smoking status: Former    Types: Cigarettes   Smokeless tobacco: Never   Tobacco comments:    2012 quit  Vaping Use   Vaping status: Never Used  Substance and Sexual Activity   Alcohol use: No   Drug use: Yes    Types: Marijuana    Comment: weekly   Sexual activity: Yes  Other Topics Concern   Not on file  Social History Narrative   Lives with fiance' [name]    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       Enjoys: music-jazz, reading-john grisham      Diet: no pork, salads daily, [place], uses sugar  free sweeters, dark chocolate 70% or higher   Caffeine: decaff-sodas, ginger ale, tea-with truiva    Water: 3-4 cups       Wears seat belt   Smoke detectors at home   Does not use phone while driving    Social Determinants of Health   Financial Resource Strain: Low Risk  (01/22/2023)   Overall Financial Resource Strain (CARDIA)    Difficulty of Paying Living Expenses: Not hard at all  Food Insecurity: No Food Insecurity (01/22/2023)   Hunger Vital Sign    Worried About Running Out of Food in the Last Year: Never true    Ran Out of Food in the Last Year: Never true  Transportation Needs: No Transportation Needs (01/22/2023)   PRAPARE - Administrator, Civil Service (Medical): No    Lack of Transportation (Non-Medical): No  Physical Activity: Sufficiently Active (01/22/2023)   Exercise Vital Sign    Days of Exercise per Week: 7 days    Minutes of Exercise per Session: 30 min  Stress: No Stress Concern Present (01/22/2023)   [employer] of Occupational Health - Occupational Stress Questionnaire    Feeling of Stress : Not at all  Social Connections: Moderately Isolated  (01/22/2023)   Social Connection and Isolation Panel [NHANES]    Frequency of Communication with Friends and Family: More than three times a week    Frequency of Social Gatherings with Friends and Family: More than three times a week    Attends Religious Services: More than 4 times per year    Active Member of [employer] or Organizations: No    Attends Banker Meetings: Never    Marital Status: Divorced    Tobacco Counseling Counseling given: Yes Tobacco comments: 2012 quit   Clinical Intake:  Pre-visit preparation completed: Yes  Pain : No/denies pain     BMI - recorded: 28.8 Nutritional Status: BMI 25 -29 Overweight Nutritional Risks: None Diabetes: Yes CBG done?: No (telehealth visit) Did pt. bring in CBG monitor from home?: No  How often do you need to have someone help you when you read instructions, pamphlets, or other written materials from your doctor or pharmacy?: 1 - Never  Interpreter Needed?: No  Information entered by :: Abby Emari Hreha, CMA   Activities of Daily Living    01/22/2023    8:48 AM  In your present state of health, do you have any difficulty performing the following activities:  Hearing? 0  Vision? 0  Comment Dr. Thereasa Solo [place]  Difficulty concentrating or making decisions? 0  Walking or climbing stairs? 0  Dressing or bathing? 0  Doing errands, shopping? 0  Preparing Food and eating ? N  Using the Toilet? N  In the past six months, have you accidently leaked urine? N  Do you have problems with loss of bowel control? N  Managing your Medications? N  Managing your Finances? N  Housekeeping or managing your Housekeeping? N    Patient Care Team: Anabel Halon, MD as PCP - General (Internal Medicine) Gavin Pound, [facility] (Inactive) (Pharmacist)  Indicate any recent Medical Services you may have received from other than [facility] providers in the past year (date may be approximate).     Assessment:    This is a routine wellness examination for [place].  Hearing/Vision screen Hearing Screening - Comments:: Patient denies any hearing difficulties.   Vision Screening - Comments:: Wears rx glasses - up to date with routine eye exams sees  Dr. Thereasa Solo in [place]   Goals Addressed             This Visit's Progress    Patient Stated       Improve my health       Depression Screen    01/22/2023    8:52 AM 01/20/2023   10:55 AM 10/03/2022    8:16 AM 05/28/2022    8:09 AM 01/24/2022    8:11 AM 09/17/2021    8:36 AM 06/05/2021   10:11 AM  PHQ 2/9 Scores  PHQ - 2 Score 0 0 0 0 0 0 0  PHQ- 9 Score 0          Fall Risk    01/22/2023    8:51 AM 01/20/2023   10:55 AM 10/03/2022    8:16 AM 05/28/2022    8:09 AM 01/24/2022    8:11 AM  Fall Risk   Falls in the past year? 0 0 0 1 1  Number falls in past yr: 0 0 0 1 1  Injury with Fall? 0 0 0 1 0  Risk for fall due to : History of fall(s);Impaired balance/gait;Impaired mobility No Fall Risks   No Fall Risks  Follow up Falls prevention discussed;Education provided Falls evaluation completed   Falls evaluation completed    MEDICARE RISK AT HOME: Medicare Risk at Home Any stairs in or around the home?: Yes If so, are there any without handrails?: No Home free of loose throw rugs in walkways, pet beds, electrical cords, etc?: Yes Adequate lighting in your home to reduce risk of falls?: Yes Life alert?: No Use of a cane, walker or w/c?: Yes Grab bars in the bathroom?: Yes Shower chair or bench in shower?: Yes Elevated toilet seat or a handicapped toilet?: No  TIMED UP AND GO:  Was the test performed?  No    Cognitive Function:        01/22/2023    8:52 AM 12/11/2021    8:21 AM 11/21/2020    1:28 PM  6CIT Screen  What Year? 0 points 0 points 0 points  What month? 0 points 0 points 0 points  What time? 0 points 0 points 0 points  Count back from 20 0 points 0 points 0 points  Months in reverse 0 points 0  points 0 points  Repeat phrase 0 points 0 points 0 points  Total Score 0 points 0 points 0 points    Immunizations Immunization History  Administered Date(s) Administered   Janssen (J&J) SARS-COV-2 Vaccination 06/01/2019   Moderna Sars-Covid-2 Vaccination 01/07/2020   Tdap 06/13/2021   Zoster Recombinant(Shingrix) 04/12/2022, 06/28/2022    TDAP status: Up to date  Flu Vaccine status: Declined, Education has been provided regarding the importance of this vaccine but patient still declined. Advised may receive this vaccine at local pharmacy or Health Dept. Aware to provide a copy of the vaccination record if obtained from local pharmacy or Health Dept. Verbalized acceptance and understanding.  Pneumococcal vaccine status: Not age appropriate for this patient.   Covid-19 vaccine status: Information provided on how to obtain vaccines.   Qualifies for Shingles Vaccine? Yes   Zostavax completed No   Shingrix Completed?: Yes  Screening Tests Health Maintenance  Topic Date Due   COVID-19 Vaccine (3 - 2023-24 season) 10/27/2022   Medicare Annual Wellness (AWV)  12/12/2022   OPHTHALMOLOGY EXAM  01/02/2023   INFLUENZA VACCINE  05/26/2023 (Originally 09/26/2022)   HEMOGLOBIN A1C  07/15/2023   Diabetic  kidney evaluation - Urine ACR  10/03/2023   FOOT EXAM  10/03/2023   Diabetic kidney evaluation - eGFR measurement  01/15/2024   DTaP/Tdap/Td (2 - Td or Tdap) 06/14/2031   Colonoscopy  12/01/2032   Hepatitis C Screening  Completed   HIV Screening  Completed   Zoster Vaccines- Shingrix  Completed   HPV VACCINES  Aged Out    Health Maintenance  Health Maintenance Due  Topic Date Due   COVID-19 Vaccine (3 - 2023-24 season) 10/27/2022   Medicare Annual Wellness (AWV)  12/12/2022   OPHTHALMOLOGY EXAM  01/02/2023    Colorectal cancer screening: Type of screening: Colonoscopy. Completed 12/02/2022. Repeat every 10 years  Lung Cancer Screening: (Low Dose CT Chest recommended if Age 38-80  years, 20 pack-year currently smoking OR have quit w/in 15years.) does not qualify.   Lung Cancer Screening Referral: na  Additional Screening:  Hepatitis C Screening: does not qualify; Completed   Vision Screening: Recommended annual ophthalmology exams for early detection of glaucoma and other disorders of the eye. Is the patient up to date with their annual eye exam?  Yes  Who is the provider or what is the name of the office in which the patient attends annual eye exams? Dr. Thereasa Solo If pt is not established with a provider, would they like to be referred to a provider to establish care? No .   Dental Screening: Recommended annual dental exams for proper oral hygiene  Diabetic Foot Exam: Diabetic Foot Exam: Completed 10/03/2022  Community Resource Referral / Chronic Care Management: CRR required this visit?  No   CCM required this visit?  No     Plan:     I have personally reviewed and noted the following in the patient's chart:   Medical and social history Use of alcohol, tobacco or illicit drugs  Current medications and supplements including opioid prescriptions. Patient is not currently taking opioid prescriptions. Functional ability and status Nutritional status Physical activity Advanced directives List of other physicians Hospitalizations, surgeries, and ER visits in previous 12 months Vitals Screenings to include cognitive, depression, and falls Referrals and appointments  In addition, I have reviewed and discussed with patient certain preventive protocols, quality metrics, and best practice recommendations. A written personalized care plan for preventive services as well as general preventive health recommendations were provided to patient.     Jordan Hawks Quintavia Rogstad, CMA   01/22/2023   After Visit Summary: (MyChart) Due to this being a telephonic visit, the after visit summary with patients personalized plan was offered to patient via MyChart   Nurse  Notes: see routing comment

## 2023-01-22 NOTE — Patient Instructions (Signed)
Justin Klein , Thank you for taking time to come for your Medicare Wellness Visit. I appreciate your ongoing commitment to your health goals. Please review the following plan we discussed and let me know if I can assist you in the future.   Referrals/Orders/Follow-Ups/Clinician Recommendations:  Next Medicare Annual Wellness Visit: January 26, 2024 at 8am virtual visit  You are due for the vaccines checked below. You may have these done at your preferred pharmacy. Please have them fax the office proof of the vaccines so that we can update your chart.   []  Flu (due annually)  Recommended this fall either at PCP office or through your local pharmacy. The flu season starts August 1 of each year.   []  Shingrix (Shingles vaccine): CDC recommends 2 doses of Shingrix separated by 2-6 months for aged 58 years and older:  []  Pneumonia Vaccines: Recommended for adults 65 years or older  []  TDAP (Tetanus) Vaccine every 10 years:Recommended every 10 years; Please call your insurance company to determine your out of pocket expense. You also receive this vaccine at your local pharmacy or Health Dept.  [x]  Covid-19: Available now at any [facility] pharmacy (see info below)  You may also get your vaccines at any [facility] (locations listed below.) Vaccine hours are Monday - Friday 9:00 - 4:00. No appointments are required. Most insurances are accepted including Medicaid. Anyone can use the community pharmacies, and people are not required to have a [facility] provider.  Community Pharmacy Locations offering vaccines:   Sport and exercise psychologist   [facility]  10 vaccines are offered at the [employer]: Covid, flu, Tdap, shingles, RSV, pneumonia, meningococcal, hepatitis A, hepatitis B, and HPV.       This is a list of the screening recommended for you and due dates:  Health Maintenance   Topic Date Due   COVID-19 Vaccine (3 - 2023-24 season) 10/27/2022   Eye exam for diabetics  01/02/2023   Flu Shot  05/26/2023*   Hemoglobin A1C  07/15/2023   Yearly kidney health urinalysis for diabetes  10/03/2023   Complete foot exam   10/03/2023   Yearly kidney function blood test for diabetes  01/15/2024   Medicare Annual Wellness Visit  01/22/2024   DTaP/Tdap/Td vaccine (2 - Td or Tdap) 06/14/2031   Colon Cancer Screening  12/01/2032   Hepatitis C Screening  Completed   HIV Screening  Completed   Zoster (Shingles) Vaccine  Completed   HPV Vaccine  Aged Out  *Topic was postponed. The date shown is not the original due date.    Advanced directives: (Declined) Advance directive discussed with you today. Even though you declined this today, please call our office should you change your mind, and we can give you the proper paperwork for you to fill out.  Next Medicare Annual Wellness Visit scheduled for next year: Yes  Preventive Care 11-4 Years Old, Male Preventive care refers to lifestyle choices and visits with your health care provider that can promote health and wellness. Preventive care visits are also called wellness exams. What can I expect for my preventive care visit? Counseling During your preventive care visit, your health care provider may ask about your: Medical history, including: Past medical problems. Family medical history. Current health, including: Emotional well-being. Home life and relationship well-being. Sexual activity. Lifestyle, including: Alcohol, nicotine or tobacco, and drug use. Access to firearms.  Diet, exercise, and sleep habits. Safety issues such as seatbelt and bike helmet use. Sunscreen use. Work and work Astronomer. Physical exam Your health care provider will check your: Height and weight. These may be used to calculate your BMI (body mass index). BMI is a measurement that tells if you are at a healthy weight. Waist circumference.  This measures the distance around your waistline. This measurement also tells if you are at a healthy weight and may help predict your risk of certain diseases, such as type 2 diabetes and high blood pressure. Heart rate and blood pressure. Body temperature. Skin for abnormal spots. What immunizations do I need?  Vaccines are usually given at various ages, according to a schedule. Your health care provider will recommend vaccines for you based on your age, medical history, and lifestyle or other factors, such as travel or where you work. What tests do I need? Screening Your health care provider may recommend screening tests for certain conditions. This may include: Lipid and cholesterol levels. Diabetes screening. This is done by checking your blood sugar (glucose) after you have not eaten for a while (fasting). Hepatitis B test. Hepatitis C test. HIV (human immunodeficiency virus) test. STI (sexually transmitted infection) testing, if you are at risk. Lung cancer screening. Prostate cancer screening. Colorectal cancer screening. Talk with your health care provider about your test results, treatment options, and if necessary, the need for more tests. Follow these instructions at home: Eating and drinking  Eat a diet that includes fresh fruits and vegetables, whole grains, lean protein, and low-fat dairy products. Take vitamin and mineral supplements as recommended by your health care provider. Do not drink alcohol if your health care provider tells you not to drink. If you drink alcohol: Limit how much you have to 0-2 drinks a day. Know how much alcohol is in your drink. In the [place] one drink equals one 12 oz bottle of beer (355 mL), one 5 oz glass of wine (148 mL), or one 1 oz glass of hard liquor (44 mL). Lifestyle Brush your teeth every morning and night with fluoride toothpaste. Floss one time each day. Exercise for at least 30 minutes 5 or more days each week. Do not use any  products that contain nicotine or tobacco. These products include cigarettes, chewing tobacco, and vaping devices, such as e-cigarettes. If you need help quitting, ask your health care provider. Do not use drugs. If you are sexually active, practice safe sex. Use a condom or other form of protection to prevent STIs. Take aspirin only as told by your health care provider. Make sure that you understand how much to take and what form to take. Work with your health care provider to find out whether it is safe and beneficial for you to take aspirin daily. Find healthy ways to manage stress, such as: Meditation, yoga, or listening to music. Journaling. Talking to a trusted person. Spending time with friends and family. Minimize exposure to UV radiation to reduce your risk of skin cancer. Safety Always wear your seat belt while driving or riding in a vehicle. Do not drive: If you have been drinking alcohol. Do not ride with someone who has been drinking. When you are tired or distracted. While texting. If you have been using any mind-altering substances or drugs. Wear a helmet and other protective equipment during sports activities. If you have firearms in your house, make sure you follow all gun safety procedures. What's next? Go to your health care provider once  a year for an annual wellness visit. Ask your health care provider how often you should have your eyes and teeth checked. Stay up to date on all vaccines. This information is not intended to replace advice given to you by your health care provider. Make sure you discuss any questions you have with your health care provider. Document Revised: 08/09/2020 Document Reviewed: 08/09/2020 Elsevier Patient Education  2024 [employer].

## 2023-02-05 ENCOUNTER — Ambulatory Visit: Payer: Medicare HMO | Admitting: Internal Medicine

## 2023-02-08 ENCOUNTER — Encounter: Payer: Self-pay | Admitting: Internal Medicine

## 2023-02-10 ENCOUNTER — Telehealth: Payer: Self-pay | Admitting: Internal Medicine

## 2023-02-10 ENCOUNTER — Other Ambulatory Visit: Payer: Self-pay

## 2023-02-10 DIAGNOSIS — N529 Male erectile dysfunction, unspecified: Secondary | ICD-10-CM

## 2023-02-10 MED ORDER — SILDENAFIL CITRATE 100 MG PO TABS: ORAL_TABLET | ORAL | 0 refills | Status: DC

## 2023-02-10 NOTE — Telephone Encounter (Signed)
[facility]  Noted  Copied Sleeved  Original in PCP box Copy front desk folder

## 2023-02-11 NOTE — Telephone Encounter (Signed)
Called patient asked to email to [facility] [email or web address] and fax the form to [phone]

## 2023-02-22 ENCOUNTER — Encounter: Payer: Self-pay | Admitting: Internal Medicine

## 2023-02-23 ENCOUNTER — Other Ambulatory Visit: Payer: Self-pay | Admitting: Internal Medicine

## 2023-02-23 DIAGNOSIS — E1140 Type 2 diabetes mellitus with diabetic neuropathy, unspecified: Secondary | ICD-10-CM

## 2023-02-24 ENCOUNTER — Other Ambulatory Visit: Payer: Self-pay

## 2023-02-24 DIAGNOSIS — E1140 Type 2 diabetes mellitus with diabetic neuropathy, unspecified: Secondary | ICD-10-CM

## 2023-02-24 MED ORDER — GABAPENTIN 300 MG PO CAPS: 300.0000 mg | ORAL_CAPSULE | Freq: Every day | ORAL | 3 refills | Status: DC

## 2023-02-24 NOTE — Telephone Encounter (Signed)
REFILL'S SENT  

## 2023-03-04 ENCOUNTER — Other Ambulatory Visit: Payer: Self-pay

## 2023-03-04 DIAGNOSIS — E109 Type 1 diabetes mellitus without complications: Secondary | ICD-10-CM | POA: Diagnosis not present

## 2023-03-04 DIAGNOSIS — E1140 Type 2 diabetes mellitus with diabetic neuropathy, unspecified: Secondary | ICD-10-CM

## 2023-03-04 LAB — HM DIABETES EYE EXAM

## 2023-03-04 MED ORDER — GABAPENTIN 300 MG PO CAPS: 300.0000 mg | ORAL_CAPSULE | Freq: Every day | ORAL | 3 refills | Status: AC

## 2023-03-04 NOTE — Telephone Encounter (Signed)
 Refill sent.

## 2023-03-14 ENCOUNTER — Other Ambulatory Visit: Payer: Self-pay | Admitting: Internal Medicine

## 2023-03-14 DIAGNOSIS — E1140 Type 2 diabetes mellitus with diabetic neuropathy, unspecified: Secondary | ICD-10-CM

## 2023-04-09 ENCOUNTER — Encounter: Payer: Self-pay | Admitting: Internal Medicine

## 2023-04-10 ENCOUNTER — Other Ambulatory Visit: Payer: Self-pay

## 2023-04-10 DIAGNOSIS — J4530 Mild persistent asthma, uncomplicated: Secondary | ICD-10-CM

## 2023-04-10 MED ORDER — FLUTICASONE FUROATE-VILANTEROL 100-25 MCG/ACT IN AEPB
1.0000 | INHALATION_SPRAY | Freq: Every day | RESPIRATORY_TRACT | 11 refills | Status: DC
Start: 2023-04-10 — End: 2023-04-10

## 2023-04-10 MED ORDER — FLUTICASONE FUROATE-VILANTEROL 100-25 MCG/ACT IN AEPB: 1.0000 | INHALATION_SPRAY | Freq: Every day | RESPIRATORY_TRACT | 11 refills | Status: DC

## 2023-04-21 ENCOUNTER — Encounter: Payer: Self-pay | Admitting: Internal Medicine

## 2023-04-22 ENCOUNTER — Other Ambulatory Visit: Payer: Self-pay | Admitting: Internal Medicine

## 2023-04-22 DIAGNOSIS — N529 Male erectile dysfunction, unspecified: Secondary | ICD-10-CM

## 2023-04-22 DIAGNOSIS — E1140 Type 2 diabetes mellitus with diabetic neuropathy, unspecified: Secondary | ICD-10-CM

## 2023-04-22 MED ORDER — SILDENAFIL CITRATE 100 MG PO TABS
ORAL_TABLET | ORAL | 1 refills | Status: DC
Start: 2023-04-22 — End: 2023-06-20

## 2023-04-22 MED ORDER — GLUCOSE BLOOD VI STRP
ORAL_STRIP | 3 refills | Status: DC
Start: 2023-04-22 — End: 2023-12-10

## 2023-05-08 ENCOUNTER — Ambulatory Visit: Payer: Medicare HMO | Admitting: Neurology

## 2023-05-12 ENCOUNTER — Ambulatory Visit (INDEPENDENT_AMBULATORY_CARE_PROVIDER_SITE_OTHER): Payer: Medicare HMO | Admitting: Diagnostic Neuroimaging

## 2023-05-12 ENCOUNTER — Encounter: Payer: Self-pay | Admitting: Diagnostic Neuroimaging

## 2023-05-12 VITALS — BP 121/68 | HR 62 | Ht 68.0 in | Wt 197.0 lb

## 2023-05-12 DIAGNOSIS — G629 Polyneuropathy, unspecified: Secondary | ICD-10-CM

## 2023-05-12 DIAGNOSIS — E538 Deficiency of other specified B group vitamins: Secondary | ICD-10-CM

## 2023-05-12 NOTE — Patient Instructions (Signed)
 Mild sequelae of B12 deficiency neuropathy + diabetic neuropathy (symptoms since 2012; overall stable; mild residual symptoms) - continue supportive care; encouraged to start exercise program - may stop gabapentin since not having severe nerve pain; but it may help with mild insomnia so ok to continue

## 2023-05-12 NOTE — Progress Notes (Signed)
 [facility]  PATIENT: Justin Klein DOB: [birth year]  REFERRING CLINICIAN: Anabel Halon, MD HISTORY FROM: patient  REASON FOR VISIT: new consult   HISTORICAL  CHIEF COMPLAINT:  Chief Complaint  Patient presents with   Numbness    RM 8 alone Pt is well, reports he has been having neuropathy and gait disturbance since his last visit with [facility] in 2012. He is having tingling in legs and feet and imbalance. Symptoms have progressed. Have had a few falls    HISTORY OF PRESENT ILLNESS:   63 year old male with history of pernicious anemia, B12 deficiency neuropathy, diabetes, thyroid disease, here for evaluation of gait and balance difficulty.  I saw patient in 2012 for progressive gait and balance difficulty.  He was diagnosed with severe B12 deficiency with lab level of 58.  He was treated with B12 replacement and symptoms improved over a few years.  Lately patient continues to have some persistent, but stable symptoms with balance and walking.  Sometimes his feet feel sensitive at nighttime and he has to sleep with socks on.  No significant worsening over time.   REVIEW OF SYSTEMS: Full 14 system review of systems performed and negative with exception of: as per HPI.  ALLERGIES: Allergies  Allergen Reactions   Metformin And Related Other (See Comments)    Low B12, gait disturbance, myopathy, neuropathy, pernicious anemia    Crestor [Rosuvastatin] Other (See Comments)    "Made me feel sluggish and drained" Planning to retry on 12/14/20   Shellfish Allergy Nausea And Vomiting    Seafood-upset stomach    HOME MEDICATIONS: Outpatient Medications Prior to Visit  Medication Sig Dispense Refill   albuterol (VENTOLIN HFA) 108 (90 Base) MCG/ACT inhaler INHALE 1 OR 2 PUFFS INTO THE LUNGS EVERY SIX HOURS AS NEEDED FOR WHEEZING OR SHORTNESS OF BREATH. 18 g 1   Alcohol Swabs (B-D SINGLE USE SWABS REGULAR) PADS USE AS DIRECTED 100 each 0   Cholecalciferol  (VITAMIN D-3) 25 MCG (1000 UT) CAPS Take 1,000 Units by mouth daily.     Cyanocobalamin (B-12) 1000 MCG CAPS Take 1,000 mcg by mouth daily.     diltiazem (CARDIZEM) 30 MG tablet TAKE 1 TABLET EVERY DAY 90 tablet 3   fluticasone (CUTIVATE) 0.05 % cream APPLY TO AFFECTED AREAS ON FACE UP TO TWICE DAILY AS NEEDED FOR RASH. 60 g 11   fluticasone furoate-vilanterol (BREO ELLIPTA) 100-25 MCG/ACT AEPB Inhale 1 puff into the lungs daily. 1 each 11   fluticasone furoate-vilanterol (BREO ELLIPTA) 100-25 MCG/INH AEPB Inhale 1 puff into the lungs daily. (Patient taking differently: Inhale 1 puff into the lungs daily as needed (asthma).) 90 each 2   gabapentin (NEURONTIN) 300 MG capsule Take 1 capsule (300 mg total) by mouth at bedtime. 90 capsule 3   glucose blood test strip Use as instructed. Use twice daily. 100 each 3   levothyroxine (SYNTHROID) 150 MCG tablet TAKE 1 TABLET EVERY MORNING 90 tablet 3   Multiple Vitamins-Minerals (MULTIVITAMIN WITH MINERALS) tablet Take 1 tablet by mouth daily.     Omega-3 Fatty Acids (FISH OIL) 1000 MG CPDR Take 1,000 mg by mouth daily.     pravastatin (PRAVACHOL) 20 MG tablet TAKE 1 TABLET BY MOUTH DAILY. 90 tablet 3   sildenafil (VIAGRA) 100 MG tablet TAKE 1 TABLET BY MOUTH AS NEEDED FOR ERECTILE DYSFUNCTION 20 tablet 1   TRUEplus Lancets 33G MISC TEST DAILY 100 each 0   TRULICITY 3 MG/0.5ML SOAJ INJECT 3 MG (0.5  ML) UNDER THE SKIN ONCE A WEEK 8 mL 0   UNABLE TO FIND True Metrix meter, True Metrix test strips, lancets, control solution, and alcohol swabs. 1 Product 0   cyclobenzaprine (FLEXERIL) 5 MG tablet Take 1 tablet (5 mg total) by mouth 2 (two) times daily as needed. (Patient not taking: Reported on 05/12/2023) 30 tablet 1   famotidine (PEPCID) 20 MG tablet Take 1 tablet (20 mg total) by mouth daily. (Patient not taking: Reported on 05/12/2023) 30 tablet 3   hydrOXYzine (ATARAX) 10 MG tablet Take 1 tablet (10 mg total) by mouth 2 (two) times daily as needed for  itching. (Patient not taking: Reported on 05/12/2023) 30 tablet 0   valACYclovir (VALTREX) 1000 MG tablet Take 1 tablet (1,000 mg total) by mouth 3 (three) times daily. (Patient not taking: Reported on 05/12/2023) 21 tablet 0   zinc oxide 20 % ointment Apply 1 Application topically in the morning and at bedtime. (Patient not taking: Reported on 05/12/2023) 56.7 g 0   No facility-administered medications prior to visit.    PAST MEDICAL HISTORY: Past Medical History:  Diagnosis Date   Asthma    Asthma 08/26/2011   Bilateral leg weakness 07/21/2012   Diabetes mellitus, type 2 (HCC)    Essential hypertension 10/27/2019   Hypothyroidism    Ingrown nail of great toe of left foot 05/11/2019   Numbness and tingling of both legs 07/21/2012   Pernicious anemia    PSVT (paroxysmal supraventricular tachycardia) (HCC) 08/26/2011   Pulmonary embolus (HCC)    Diagnosed 9/12 at [place]   Pulmonary embolus (HCC) 08/26/2011   Seborrheic dermatitis    Strain of muscle, fascia and tendon of lower back, initial encounter 01/24/2020   SVT (supraventricular tachycardia) (HCC)    Wart on thumb 08/20/2019    PAST SURGICAL HISTORY: Past Surgical History:  Procedure Laterality Date   COLONOSCOPY WITH PROPOFOL N/A 09/27/2021   Procedure: COLONOSCOPY WITH PROPOFOL;  Surgeon: Corbin Ade, MD;  Location: [facility];  Service: Endoscopy;  Laterality: N/A;  9:30am   COLONOSCOPY WITH PROPOFOL N/A 12/02/2022   Procedure: COLONOSCOPY WITH PROPOFOL;  Surgeon: Corbin Ade, MD;  Location: [facility];  Service: Endoscopy;  Laterality: N/A;  8:15 AM, ASA 2   Epidermal cyst resection     Left patella tendon repair      FAMILY HISTORY: Family History  Problem Relation Age of Onset   Diabetes type II Mother    Cancer Father    Diabetes type II Sister     SOCIAL HISTORY: Social History   Socioeconomic History   Marital status: Divorced    Spouse name: Not on file   Number of children: 3   Years of education:  Not on file   Highest education level: 12th grade  Occupational History   Not on file  Tobacco Use   Smoking status: Former    Types: Cigarettes   Smokeless tobacco: Never   Tobacco comments:    2012 quit  Vaping Use   Vaping status: Never Used  Substance and Sexual Activity   Alcohol use: No   Drug use: Yes    Types: Marijuana    Comment: weekly   Sexual activity: Yes  Other Topics Concern   Not on file  Social History Narrative   Lives with fiance' [name]    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       Enjoys: music-jazz, reading-john grisham      Diet: no pork, salads daily, [place], uses  sugar free sweeters, dark chocolate 70% or higher   Caffeine: decaff-sodas, ginger ale, tea-with truiva    Water: 3-4 cups       Wears seat belt   Smoke detectors at home   Does not use phone while driving    Social Drivers of Health   Financial Resource Strain: Low Risk  (01/22/2023)   Overall Financial Resource Strain (CARDIA)    Difficulty of Paying Living Expenses: Not hard at all  Food Insecurity: No Food Insecurity (01/22/2023)   Hunger Vital Sign    Worried About Running Out of Food in the Last Year: Never true    Ran Out of Food in the Last Year: Never true  Transportation Needs: No Transportation Needs (01/22/2023)   PRAPARE - Administrator, Civil Service (Medical): No    Lack of Transportation (Non-Medical): No  Physical Activity: Sufficiently Active (01/22/2023)   Exercise Vital Sign    Days of Exercise per Week: 7 days    Minutes of Exercise per Session: 30 min  Stress: No Stress Concern Present (01/22/2023)   [employer] of Occupational Health - Occupational Stress Questionnaire    Feeling of Stress : Not at all  Social Connections: Moderately Isolated (01/22/2023)   Social Connection and Isolation Panel [NHANES]    Frequency of Communication with Friends and Family: More than three times a week    Frequency of Social Gatherings with Friends and Family: More than  three times a week    Attends Religious Services: More than 4 times per year    Active Member of [employer] or Organizations: No    Attends Banker Meetings: Never    Marital Status: Divorced  Catering manager Violence: Not At Risk (01/22/2023)   Humiliation, Afraid, Rape, and Kick questionnaire    Fear of Current or Ex-Partner: No    Emotionally Abused: No    Physically Abused: No    Sexually Abused: No     PHYSICAL EXAM  GENERAL EXAM/CONSTITUTIONAL: Vitals:  Vitals:   05/12/23 1024  BP: 121/68  Pulse: 62  Weight: 197 lb (89.4 kg)  Height: 5\' 8"  (1.727 m)   Body mass index is 29.95 kg/m. Wt Readings from Last 3 Encounters:  05/12/23 197 lb (89.4 kg)  01/22/23 195 lb (88.5 kg)  01/20/23 198 lb 3.2 oz (89.9 kg)   Patient is in no distress; well developed, nourished and groomed; neck is supple  CARDIOVASCULAR: Examination of carotid arteries is normal; no carotid bruits Regular rate and rhythm, no murmurs Examination of peripheral vascular system by observation and palpation is normal  EYES: Ophthalmoscopic exam of optic discs and posterior segments is normal; no papilledema or hemorrhages No results found.  MUSCULOSKELETAL: Gait, strength, tone, movements noted in Neurologic exam below  NEUROLOGIC: MENTAL STATUS:      No data to display         awake, alert, oriented to person, place and time recent and remote memory intact normal attention and concentration language fluent, comprehension intact, naming intact fund of knowledge appropriate  CRANIAL NERVE:  2nd - no papilledema on fundoscopic exam 2nd, 3rd, 4th, 6th - pupils equal and reactive to light, visual fields full to confrontation, extraocular muscles intact, no nystagmus 5th - facial sensation symmetric 7th - facial strength symmetric 8th - hearing intact 9th - palate elevates symmetrically, uvula midline 11th - shoulder shrug symmetric 12th - tongue protrusion midline  MOTOR:   normal bulk and tone, full strength in the BUE,  BLE  SENSORY:  normal and symmetric to light touch, temperature, vibration; REDUCED IN LEFT FOOT  COORDINATION:  finger-nose-finger, fine finger movements normal  REFLEXES:  deep tendon reflexes TRACE and symmetric  GAIT/STATION:  narrow based gait     DIAGNOSTIC DATA (LABS, IMAGING, TESTING) - I reviewed patient records, labs, notes, testing and imaging myself where available.  Lab Results  Component Value Date   WBC 3.8 10/01/2022   HGB 13.0 10/01/2022   HCT 40.5 10/01/2022   MCV 90 10/01/2022   PLT 212 10/01/2022      Component Value Date/Time   NA 140 01/15/2023 1502   K 4.4 01/15/2023 1502   CL 103 01/15/2023 1502   CO2 23 01/15/2023 1502   GLUCOSE 86 01/15/2023 1502   BUN 15 01/15/2023 1502   CREATININE 0.98 01/15/2023 1502   CALCIUM 10.5 (H) 01/15/2023 1502   PROT 7.5 01/15/2023 1502   ALBUMIN 4.6 01/15/2023 1502   AST 21 01/15/2023 1502   ALT 11 01/15/2023 1502   ALKPHOS 91 01/15/2023 1502   BILITOT 0.7 01/15/2023 1502   GFRNONAA 69 02/22/2020 0904   GFRAA 80 02/22/2020 0904   Lab Results  Component Value Date   CHOL 133 01/15/2023   HDL 48 01/15/2023   LDLCALC 74 01/15/2023   TRIG 50 01/15/2023   CHOLHDL 2.8 01/15/2023   Lab Results  Component Value Date   HGBA1C 6.1 (H) 01/15/2023   Lab Results  Component Value Date   VITAMINB12 1,147 10/01/2022   Lab Results  Component Value Date   TSH 3.450 10/01/2022       ASSESSMENT AND PLAN  64 y.o. year old male here with:   Dx:  1. Neuropathy   2. B12 deficiency       PLAN:  Mild sequelae of B12 deficiency neuropathy + diabetic neuropathy (symptoms since 2012; overall stable; mild residual symptoms) - continue supportive care; encouraged to start exercise program - may stop gabapentin since not having severe nerve pain; but it may help with mild insomnia so ok to continue  Return for return to PCP, pending if symptoms worsen or  fail to improve.    Suanne Marker, MD 05/12/2023, 5:05 PM Certified in Neurology, Neurophysiology and Neuroimaging  [facility] [address], [address] [place], [place] [address] [phone]

## 2023-05-14 DIAGNOSIS — Z125 Encounter for screening for malignant neoplasm of prostate: Secondary | ICD-10-CM | POA: Diagnosis not present

## 2023-05-14 DIAGNOSIS — I471 Supraventricular tachycardia, unspecified: Secondary | ICD-10-CM | POA: Diagnosis not present

## 2023-05-14 DIAGNOSIS — I10 Essential (primary) hypertension: Secondary | ICD-10-CM | POA: Diagnosis not present

## 2023-05-14 DIAGNOSIS — E039 Hypothyroidism, unspecified: Secondary | ICD-10-CM | POA: Diagnosis not present

## 2023-05-14 DIAGNOSIS — E1140 Type 2 diabetes mellitus with diabetic neuropathy, unspecified: Secondary | ICD-10-CM | POA: Diagnosis not present

## 2023-05-15 LAB — CBC WITH DIFFERENTIAL/PLATELET
Basophils Absolute: 0.1 10*3/uL (ref 0.0–0.2)
Basos: 1 %
EOS (ABSOLUTE): 0.1 10*3/uL (ref 0.0–0.4)
Eos: 1 %
Hematocrit: 39.3 % (ref 37.5–51.0)
Hemoglobin: 12.8 g/dL — ABNORMAL LOW (ref 13.0–17.7)
Immature Grans (Abs): 0 10*3/uL (ref 0.0–0.1)
Immature Granulocytes: 0 %
Lymphocytes Absolute: 1.3 10*3/uL (ref 0.7–3.1)
Lymphs: 30 %
MCH: 29.4 pg (ref 26.6–33.0)
MCHC: 32.6 g/dL (ref 31.5–35.7)
MCV: 90 fL (ref 79–97)
Monocytes Absolute: 0.3 10*3/uL (ref 0.1–0.9)
Monocytes: 7 %
Neutrophils Absolute: 2.5 10*3/uL (ref 1.4–7.0)
Neutrophils: 61 %
Platelets: 229 10*3/uL (ref 150–450)
RBC: 4.35 x10E6/uL (ref 4.14–5.80)
RDW: 14 % (ref 11.6–15.4)
WBC: 4.2 10*3/uL (ref 3.4–10.8)

## 2023-05-15 LAB — TSH+FREE T4
Free T4: 1.39 ng/dL (ref 0.82–1.77)
TSH: 3.45 u[IU]/mL (ref 0.450–4.500)

## 2023-05-15 LAB — CMP14+EGFR
ALT: 13 IU/L (ref 0–44)
AST: 21 IU/L (ref 0–40)
Albumin: 4.2 g/dL (ref 3.9–4.9)
Alkaline Phosphatase: 88 IU/L (ref 44–121)
BUN/Creatinine Ratio: 12 (ref 10–24)
BUN: 12 mg/dL (ref 8–27)
Bilirubin Total: 0.5 mg/dL (ref 0.0–1.2)
CO2: 22 mmol/L (ref 20–29)
Calcium: 10.2 mg/dL (ref 8.6–10.2)
Chloride: 104 mmol/L (ref 96–106)
Creatinine, Ser: 1.02 mg/dL (ref 0.76–1.27)
Globulin, Total: 2.8 g/dL (ref 1.5–4.5)
Glucose: 93 mg/dL (ref 70–99)
Potassium: 4.7 mmol/L (ref 3.5–5.2)
Sodium: 138 mmol/L (ref 134–144)
Total Protein: 7 g/dL (ref 6.0–8.5)
eGFR: 83 mL/min/{1.73_m2} (ref >59)

## 2023-05-15 LAB — PSA: Prostate Specific Ag, Serum: 4.9 ng/mL — ABNORMAL HIGH (ref 0.0–4.0)

## 2023-05-15 LAB — HEMOGLOBIN A1C
Est. average glucose Bld gHb Est-mCnc: 126 mg/dL
Hgb A1c MFr Bld: 6 % — ABNORMAL HIGH (ref 4.8–5.6)

## 2023-05-21 ENCOUNTER — Encounter: Payer: Self-pay | Admitting: Internal Medicine

## 2023-05-21 ENCOUNTER — Ambulatory Visit (INDEPENDENT_AMBULATORY_CARE_PROVIDER_SITE_OTHER): Payer: Medicare HMO | Admitting: Internal Medicine

## 2023-05-21 VITALS — BP 126/77 | HR 70 | Ht 68.0 in | Wt 204.6 lb

## 2023-05-21 DIAGNOSIS — I471 Supraventricular tachycardia, unspecified: Secondary | ICD-10-CM | POA: Diagnosis not present

## 2023-05-21 DIAGNOSIS — I10 Essential (primary) hypertension: Secondary | ICD-10-CM

## 2023-05-21 DIAGNOSIS — D649 Anemia, unspecified: Secondary | ICD-10-CM | POA: Diagnosis not present

## 2023-05-21 DIAGNOSIS — J4530 Mild persistent asthma, uncomplicated: Secondary | ICD-10-CM

## 2023-05-21 DIAGNOSIS — E039 Hypothyroidism, unspecified: Secondary | ICD-10-CM

## 2023-05-21 DIAGNOSIS — R972 Elevated prostate specific antigen [PSA]: Secondary | ICD-10-CM

## 2023-05-21 DIAGNOSIS — E1140 Type 2 diabetes mellitus with diabetic neuropathy, unspecified: Secondary | ICD-10-CM

## 2023-05-21 MED ORDER — BUDESONIDE-FORMOTEROL FUMARATE 80-4.5 MCG/ACT IN AERO
2.0000 | INHALATION_SPRAY | Freq: Two times a day (BID) | RESPIRATORY_TRACT | 3 refills | Status: AC
Start: 2023-05-21 — End: ?

## 2023-05-21 NOTE — Progress Notes (Signed)
 Established Patient Office Visit  Subjective:  Patient ID: Justin Klein, male    DOB: 16-Mar-1960  Age: 63 y.o. MRN: [id number]  CC:  Chief Complaint  Patient presents with   Care Management    4 month f/u, has concerns about PSA levels and other lab questions.     HPI Justin Klein is a 63 y.o. male with past medical history of type II DM, hypothyroidism, HLD and PSVT who presents for f/u of his chronic medical conditions.  Type II DM:  His HbA1c is 6.0 now. He has been taking Trulicity 3 mg qw for it, gets refills from patient assistance program. He denies any polyuria or polydipsia currently.  He takes Pravastatin for HLD. He denies any chest pain or dyspnea currently.  Diabetic neuropathy: He has history of diabetic neuropathy and has been having gait disturbance. He has had recurrent falls as well.  He was placed on gabapentin 300 mg nightly later, but has seen only mild improvement. He was evaluated by Neurology, and has been advised to continue Gabapentin. Denies any major injury. He also has history of pernicious anemia, and has taken B12 oral supplements.  He has history of PSVT, but has not had any dizziness or palpitations recently.  He has diltiazem as needed for palpitations.  Elevated PSA: His PSA level was borderline elevated at 4.9.  He has weak urinary stream, but denies urinary hesitancy or resistance currently.  Denies dysuria or hematuria. He reports having sexual intercourse 1-2 days prior to blood draw.  Past Medical History:  Diagnosis Date   Asthma    Asthma 08/26/2011   Bilateral leg weakness 07/21/2012   Diabetes mellitus, type 2 (HCC)    Essential hypertension 10/27/2019   Hypothyroidism    Ingrown nail of great toe of left foot 05/11/2019   Numbness and tingling of both legs 07/21/2012   Pernicious anemia    PSVT (paroxysmal supraventricular tachycardia) (HCC) 08/26/2011   Pulmonary embolus (HCC)    Diagnosed 9/12 at [facility]   Pulmonary embolus (HCC)  08/26/2011   Seborrheic dermatitis    Strain of muscle, fascia and tendon of lower back, initial encounter 01/24/2020   SVT (supraventricular tachycardia) (HCC)    Wart on thumb 08/20/2019    Past Surgical History:  Procedure Laterality Date   COLONOSCOPY WITH PROPOFOL N/A 09/27/2021   Procedure: COLONOSCOPY WITH PROPOFOL;  Surgeon: Corbin Ade, MD;  Location: [facility];  Service: Endoscopy;  Laterality: N/A;  9:30am   COLONOSCOPY WITH PROPOFOL N/A 12/02/2022   Procedure: COLONOSCOPY WITH PROPOFOL;  Surgeon: Corbin Ade, MD;  Location: [facility];  Service: Endoscopy;  Laterality: N/A;  8:15 AM, ASA 2   Epidermal cyst resection     Left patella tendon repair      Family History  Problem Relation Age of Onset   Diabetes type II Mother    Cancer Father    Diabetes type II Sister     Social History   Socioeconomic History   Marital status: Divorced    Spouse name: Not on file   Number of children: 3   Years of education: Not on file   Highest education level: 12th grade  Occupational History   Not on file  Tobacco Use   Smoking status: Former    Types: Cigarettes   Smokeless tobacco: Never   Tobacco comments:    2012 quit  Vaping Use   Vaping status: Never Used  Substance and Sexual Activity  Alcohol use: No   Drug use: Yes    Types: Marijuana    Comment: weekly   Sexual activity: Yes  Other Topics Concern   Not on file  Social History Narrative   Lives with fiance' Justin Klein    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       Enjoys: music-jazz, reading-john grisham      Diet: no pork, salads daily, [place], uses sugar free sweeters, dark chocolate 70% or higher   Caffeine: decaff-sodas, ginger ale, tea-with truiva    Water: 3-4 cups       Wears seat belt   Smoke detectors at home   Does not use phone while driving    Social Drivers of Health   Financial Resource Strain: Low Risk  (01/22/2023)   Overall Financial Resource Strain (CARDIA)    Difficulty of Paying Living  Expenses: Not hard at all  Food Insecurity: No Food Insecurity (01/22/2023)   Hunger Vital Sign    Worried About Running Out of Food in the Last Year: Never true    Ran Out of Food in the Last Year: Never true  Transportation Needs: No Transportation Needs (01/22/2023)   PRAPARE - Administrator, Civil Service (Medical): No    Lack of Transportation (Non-Medical): No  Physical Activity: Sufficiently Active (01/22/2023)   Exercise Vital Sign    Days of Exercise per Week: 7 days    Minutes of Exercise per Session: 30 min  Stress: No Stress Concern Present (01/22/2023)   [employer] of Occupational Health - Occupational Stress Questionnaire    Feeling of Stress : Not at all  Social Connections: Moderately Isolated (01/22/2023)   Social Connection and Isolation Panel [NHANES]    Frequency of Communication with Friends and Family: More than three times a week    Frequency of Social Gatherings with Friends and Family: More than three times a week    Attends Religious Services: More than 4 times per year    Active Member of [employer] or Organizations: No    Attends Banker Meetings: Never    Marital Status: Divorced  Catering manager Violence: Not At Risk (01/22/2023)   Humiliation, Afraid, Rape, and Kick questionnaire    Fear of Current or Ex-Partner: No    Emotionally Abused: No    Physically Abused: No    Sexually Abused: No    Outpatient Medications Prior to Visit  Medication Sig Dispense Refill   albuterol (VENTOLIN HFA) 108 (90 Base) MCG/ACT inhaler INHALE 1 OR 2 PUFFS INTO THE LUNGS EVERY SIX HOURS AS NEEDED FOR WHEEZING OR SHORTNESS OF BREATH. 18 g 1   Alcohol Swabs (B-D SINGLE USE SWABS REGULAR) PADS USE AS DIRECTED 100 each 0   Cholecalciferol (VITAMIN D-3) 25 MCG (1000 UT) CAPS Take 1,000 Units by mouth daily.     Cyanocobalamin (B-12) 1000 MCG CAPS Take 1,000 mcg by mouth daily.     diltiazem (CARDIZEM) 30 MG tablet TAKE 1 TABLET EVERY DAY 90  tablet 3   fluticasone (CUTIVATE) 0.05 % cream APPLY TO AFFECTED AREAS ON FACE UP TO TWICE DAILY AS NEEDED FOR RASH. 60 g 11   gabapentin (NEURONTIN) 300 MG capsule Take 1 capsule (300 mg total) by mouth at bedtime. 90 capsule 3   glucose blood test strip Use as instructed. Use twice daily. 100 each 3   levothyroxine (SYNTHROID) 150 MCG tablet TAKE 1 TABLET EVERY MORNING 90 tablet 3   Multiple Vitamins-Minerals (MULTIVITAMIN WITH MINERALS) tablet Take 1 tablet by  mouth daily.     Omega-3 Fatty Acids (FISH OIL) 1000 MG CPDR Take 1,000 mg by mouth daily.     pravastatin (PRAVACHOL) 20 MG tablet TAKE 1 TABLET BY MOUTH DAILY. 90 tablet 3   sildenafil (VIAGRA) 100 MG tablet TAKE 1 TABLET BY MOUTH AS NEEDED FOR ERECTILE DYSFUNCTION 20 tablet 1   TRUEplus Lancets 33G MISC TEST DAILY 100 each 0   TRULICITY 3 MG/0.5ML SOAJ INJECT 3 MG (0.5 ML) UNDER THE SKIN ONCE A WEEK 8 mL 0   UNABLE TO FIND True Metrix meter, True Metrix test strips, lancets, control solution, and alcohol swabs. 1 Product 0   fluticasone furoate-vilanterol (BREO ELLIPTA) 100-25 MCG/ACT AEPB Inhale 1 puff into the lungs daily. 1 each 11   fluticasone furoate-vilanterol (BREO ELLIPTA) 100-25 MCG/INH AEPB Inhale 1 puff into the lungs daily. (Patient taking differently: Inhale 1 puff into the lungs daily as needed (asthma).) 90 each 2   No facility-administered medications prior to visit.    Allergies  Allergen Reactions   Metformin And Related Other (See Comments)    Low B12, gait disturbance, myopathy, neuropathy, pernicious anemia    Crestor [Rosuvastatin] Other (See Comments)    "Made me feel sluggish and drained" Planning to retry on 12/14/20   Shellfish Allergy Nausea And Vomiting    Seafood-upset stomach    ROS Review of Systems  Constitutional:  Negative for chills and fever.  HENT:  Negative for congestion and sore throat.   Eyes:  Negative for pain and discharge.  Respiratory:  Negative for cough and shortness of  breath.   Cardiovascular:  Negative for chest pain and palpitations.  Gastrointestinal:  Negative for constipation, diarrhea, nausea and vomiting.  Endocrine: Negative for polydipsia and polyuria.  Genitourinary:  Negative for dysuria and hematuria.  Musculoskeletal:  Positive for back pain. Negative for neck pain and neck stiffness.  Skin:  Negative for rash.  Neurological:  Positive for numbness (B/l feet). Negative for dizziness, weakness and headaches.  Psychiatric/Behavioral:  Negative for agitation and behavioral problems.       Objective:    Physical Exam Vitals reviewed.  Constitutional:      General: He is not in acute distress.    Appearance: He is not diaphoretic.  HENT:     Head: Normocephalic and atraumatic.     Nose: Nose normal.     Mouth/Throat:     Mouth: Mucous membranes are moist.  Eyes:     General: No scleral icterus.    Extraocular Movements: Extraocular movements intact.  Cardiovascular:     Rate and Rhythm: Normal rate and regular rhythm.     Heart sounds: Normal heart sounds. No murmur heard. Pulmonary:     Breath sounds: Normal breath sounds. No wheezing or rales.  Musculoskeletal:     Cervical back: Neck supple. No tenderness.     Lumbar back: Tenderness (Lower lumbar and right paraspinal area) present. Negative right straight leg raise test and negative left straight leg raise test.     Right lower leg: No edema.     Left lower leg: No edema.  Skin:    General: Skin is warm.     Findings: No rash.  Neurological:     General: No focal deficit present.     Mental Status: He is alert and oriented to person, place, and time.     Sensory: Sensory deficit (B/l feet) present.     Motor: No weakness.  Psychiatric:  Mood and Affect: Mood normal.        Behavior: Behavior normal.     BP 126/77   Pulse 70   Ht 5\' 8"  (1.727 m)   Wt 204 lb 9.6 oz (92.8 kg)   SpO2 94%   BMI 31.11 kg/m  Wt Readings from Last 3 Encounters:  05/21/23 204 lb  9.6 oz (92.8 kg)  05/12/23 197 lb (89.4 kg)  01/22/23 195 lb (88.5 kg)    Lab Results  Component Value Date   TSH 3.450 05/14/2023   Lab Results  Component Value Date   WBC 4.2 05/14/2023   HGB 12.8 (L) 05/14/2023   HCT 39.3 05/14/2023   MCV 90 05/14/2023   PLT 229 05/14/2023   Lab Results  Component Value Date   NA 138 05/14/2023   K 4.7 05/14/2023   CO2 22 05/14/2023   GLUCOSE 93 05/14/2023   BUN 12 05/14/2023   CREATININE 1.02 05/14/2023   BILITOT 0.5 05/14/2023   ALKPHOS 88 05/14/2023   AST 21 05/14/2023   ALT 13 05/14/2023   PROT 7.0 05/14/2023   ALBUMIN 4.2 05/14/2023   CALCIUM 10.2 05/14/2023   EGFR 83 05/14/2023   Lab Results  Component Value Date   CHOL 133 01/15/2023   Lab Results  Component Value Date   HDL 48 01/15/2023   Lab Results  Component Value Date   LDLCALC 74 01/15/2023   Lab Results  Component Value Date   TRIG 50 01/15/2023   Lab Results  Component Value Date   CHOLHDL 2.8 01/15/2023   Lab Results  Component Value Date   HGBA1C 6.0 (H) 05/14/2023      Assessment & Plan:   Problem List Items Addressed This Visit       Cardiovascular and Mediastinum   Essential hypertension - Primary (Chronic)   BP Readings from Last 1 Encounters:  05/21/23 126/77   Well-controlled  Has Diltiazem for PSVT Counseled for compliance with the medications Advised DASH diet and moderate exercise/walking, at least 150 mins/week      Relevant Orders   CMP14+EGFR   CBC with Differential/Platelet   PSVT (paroxysmal supraventricular tachycardia) (HCC)   Asymptomatic currently Has diltiazem as needed, as has not required it for a long time        Respiratory   Asthma (Chronic)   Well-controlled with albuterol as needed Also has Breo, but uses it inconsistently and was expensive, could not get refill Sent Symbicort instead      Relevant Medications   budesonide-formoterol (SYMBICORT) 80-4.5 MCG/ACT inhaler     Endocrine   Type 2  diabetes mellitus with diabetic neuropathy, without long-term current use of insulin (HCC) (Chronic)   Lab Results  Component Value Date   HGBA1C 6.0 (H) 05/14/2023   Well controlled On Trulicity 3 mg dose currently, refills sent to specialty pharmacy as per patient request -[employer] application form provided for re-enrollment Advised to follow diabetic diet On pravastatin F/u CMP and lipid panel Diabetic eye exam: Advised to follow up with Ophthalmology for diabetic eye exam  Had recurrent falls likely due to diabetic neuropathy, had started gabapentin 100 mg nightly -increased dose to 300 mg nightly later Has been evaluated by neurology for neuropathy      Relevant Orders   CMP14+EGFR   Hemoglobin A1c   Hypothyroidism (Chronic)   Lab Results  Component Value Date   TSH 3.450 05/14/2023   On levothyroxine 150 mcg daily  Other   Elevated PSA   Has weak urinary stream, but denies other urinary symptoms Recheck PSA after 4 months If persistently elevated, will refer to Urology      Relevant Orders   PSA   Anemia   Hb: 12.8 Denies any active signs of bleeding Has h/o B12 deficiency/?pernicious anemia in 2012 - takes oral B12 supplement currently Check iron profile and B12 levels Advised to take iron supplement - ferrous sulphate 325 mg once daily      Relevant Orders   CBC with Differential/Platelet   Fe+TIBC+Fer   B12     Meds ordered this encounter  Medications   budesonide-formoterol (SYMBICORT) 80-4.5 MCG/ACT inhaler    Sig: Inhale 2 puffs into the lungs 2 (two) times daily.    Dispense:  1 each    Refill:  3    Follow-up: Return in about 4 months (around 09/20/2023) for DM and HTN.    Anabel Halon, MD

## 2023-05-21 NOTE — Patient Instructions (Addendum)
 Please start taking ferrous sulfate 325 mg once daily.  Please continue to take medications as prescribed.  Please continue to follow low carb diet and perform moderate exercise/walking at least 150 mins/week.  Please get blood tests done before the next visit.

## 2023-05-21 NOTE — Assessment & Plan Note (Signed)
 Asymptomatic currently ?Has diltiazem as needed, as has not required it for a long time ?

## 2023-05-21 NOTE — Assessment & Plan Note (Signed)
 Well-controlled with albuterol as needed Also has Breo, but uses it inconsistently and was expensive, could not get refill Sent Symbicort instead

## 2023-05-21 NOTE — Assessment & Plan Note (Signed)
 Has weak urinary stream, but denies other urinary symptoms Recheck PSA after 4 months If persistently elevated, will refer to Urology

## 2023-05-21 NOTE — Assessment & Plan Note (Addendum)
 Hb: 12.8 Denies any active signs of bleeding Has h/o B12 deficiency/?pernicious anemia in 2012 - takes oral B12 supplement currently Check iron profile and B12 levels Advised to take iron supplement - ferrous sulphate 325 mg once daily

## 2023-05-21 NOTE — Assessment & Plan Note (Signed)
 Lab Results  Component Value Date   TSH 3.450 05/14/2023   On levothyroxine 150 mcg daily

## 2023-05-21 NOTE — Assessment & Plan Note (Signed)
 BP Readings from Last 1 Encounters:  05/21/23 126/77   Well-controlled  Has Diltiazem for PSVT Counseled for compliance with the medications Advised DASH diet and moderate exercise/walking, at least 150 mins/week

## 2023-05-21 NOTE — Assessment & Plan Note (Addendum)
 Lab Results  Component Value Date   HGBA1C 6.0 (H) 05/14/2023   Well controlled On Trulicity 3 mg dose currently, refills sent to specialty pharmacy as per patient request -[employer] application form provided for re-enrollment Advised to follow diabetic diet On pravastatin F/u CMP and lipid panel Diabetic eye exam: Advised to follow up with Ophthalmology for diabetic eye exam  Had recurrent falls likely due to diabetic neuropathy, had started gabapentin 100 mg nightly -increased dose to 300 mg nightly later Has been evaluated by neurology for neuropathy

## 2023-05-29 ENCOUNTER — Other Ambulatory Visit: Payer: Self-pay | Admitting: Internal Medicine

## 2023-05-29 DIAGNOSIS — E1140 Type 2 diabetes mellitus with diabetic neuropathy, unspecified: Secondary | ICD-10-CM

## 2023-06-03 ENCOUNTER — Encounter: Payer: Self-pay | Admitting: Internal Medicine

## 2023-06-05 ENCOUNTER — Telehealth: Payer: Self-pay

## 2023-06-05 NOTE — Telephone Encounter (Signed)
 PAP: Patient assistance application for Trulicity has been approved by PAP Companies: [employer] from 06/05/2023 to 02/25/2024. Medication should be delivered to PAP Delivery: Home. For further shipping updates, please contact [employer] at [phone]. Patient ID is: [id number]

## 2023-06-20 ENCOUNTER — Encounter: Payer: Self-pay | Admitting: Internal Medicine

## 2023-06-20 ENCOUNTER — Other Ambulatory Visit: Payer: Self-pay | Admitting: Internal Medicine

## 2023-06-20 DIAGNOSIS — N529 Male erectile dysfunction, unspecified: Secondary | ICD-10-CM

## 2023-06-20 MED ORDER — SILDENAFIL CITRATE 100 MG PO TABS
ORAL_TABLET | ORAL | 3 refills | Status: DC
Start: 2023-06-20 — End: 2023-11-21

## 2023-08-02 ENCOUNTER — Other Ambulatory Visit: Payer: Self-pay | Admitting: Internal Medicine

## 2023-08-02 DIAGNOSIS — I10 Essential (primary) hypertension: Secondary | ICD-10-CM

## 2023-09-29 DIAGNOSIS — R972 Elevated prostate specific antigen [PSA]: Secondary | ICD-10-CM | POA: Diagnosis not present

## 2023-09-29 DIAGNOSIS — I10 Essential (primary) hypertension: Secondary | ICD-10-CM | POA: Diagnosis not present

## 2023-09-29 DIAGNOSIS — E1140 Type 2 diabetes mellitus with diabetic neuropathy, unspecified: Secondary | ICD-10-CM | POA: Diagnosis not present

## 2023-09-29 DIAGNOSIS — D649 Anemia, unspecified: Secondary | ICD-10-CM | POA: Diagnosis not present

## 2023-09-30 LAB — CBC WITH DIFFERENTIAL/PLATELET
Basophils Absolute: 0.1 x10E3/uL (ref 0.0–0.2)
Basos: 1 %
EOS (ABSOLUTE): 0 x10E3/uL (ref 0.0–0.4)
Eos: 1 %
Hematocrit: 43 % (ref 37.5–51.0)
Hemoglobin: 13.5 g/dL (ref 13.0–17.7)
Immature Grans (Abs): 0 x10E3/uL (ref 0.0–0.1)
Immature Granulocytes: 0 %
Lymphocytes Absolute: 1.1 x10E3/uL (ref 0.7–3.1)
Lymphs: 29 %
MCH: 28.9 pg (ref 26.6–33.0)
MCHC: 31.4 g/dL — ABNORMAL LOW (ref 31.5–35.7)
MCV: 92 fL (ref 79–97)
Monocytes Absolute: 0.3 x10E3/uL (ref 0.1–0.9)
Monocytes: 9 %
Neutrophils Absolute: 2.3 x10E3/uL (ref 1.4–7.0)
Neutrophils: 60 %
Platelets: 200 x10E3/uL (ref 150–450)
RBC: 4.67 x10E6/uL (ref 4.14–5.80)
RDW: 14.7 % (ref 11.6–15.4)
WBC: 3.8 x10E3/uL (ref 3.4–10.8)

## 2023-09-30 LAB — CMP14+EGFR
ALT: 14 IU/L (ref 0–44)
AST: 24 IU/L (ref 0–40)
Albumin: 4.4 g/dL (ref 3.9–4.9)
Alkaline Phosphatase: 94 IU/L (ref 44–121)
BUN/Creatinine Ratio: 10 (ref 10–24)
BUN: 11 mg/dL (ref 8–27)
Bilirubin Total: 0.3 mg/dL (ref 0.0–1.2)
CO2: 23 mmol/L (ref 20–29)
Calcium: 10.4 mg/dL — ABNORMAL HIGH (ref 8.6–10.2)
Chloride: 102 mmol/L (ref 96–106)
Creatinine, Ser: 1.14 mg/dL (ref 0.76–1.27)
Globulin, Total: 2.8 g/dL (ref 1.5–4.5)
Glucose: 99 mg/dL (ref 70–99)
Potassium: 4.8 mmol/L (ref 3.5–5.2)
Sodium: 136 mmol/L (ref 134–144)
Total Protein: 7.2 g/dL (ref 6.0–8.5)
eGFR: 73 mL/min/1.73 (ref >59)

## 2023-09-30 LAB — IRON,TIBC AND FERRITIN PANEL
Ferritin: 30 ng/mL (ref 30–400)
Iron Saturation: 12 % — ABNORMAL LOW (ref 15–55)
Iron: 46 ug/dL (ref 38–169)
Total Iron Binding Capacity: 393 ug/dL (ref 250–450)
UIBC: 347 ug/dL — ABNORMAL HIGH (ref 111–343)

## 2023-09-30 LAB — VITAMIN B12: Vitamin B-12: 752 pg/mL (ref 232–1245)

## 2023-09-30 LAB — HEMOGLOBIN A1C
Est. average glucose Bld gHb Est-mCnc: 120 mg/dL
Hgb A1c MFr Bld: 5.8 % — ABNORMAL HIGH (ref 4.8–5.6)

## 2023-09-30 LAB — PSA: Prostate Specific Ag, Serum: 3 ng/mL (ref 0.0–4.0)

## 2023-10-06 ENCOUNTER — Ambulatory Visit (INDEPENDENT_AMBULATORY_CARE_PROVIDER_SITE_OTHER): Admitting: Internal Medicine

## 2023-10-06 ENCOUNTER — Encounter: Payer: Self-pay | Admitting: Internal Medicine

## 2023-10-06 VITALS — BP 127/75 | HR 69 | Ht 69.0 in | Wt 198.8 lb

## 2023-10-06 DIAGNOSIS — E039 Hypothyroidism, unspecified: Secondary | ICD-10-CM | POA: Diagnosis not present

## 2023-10-06 DIAGNOSIS — Z0001 Encounter for general adult medical examination with abnormal findings: Secondary | ICD-10-CM | POA: Diagnosis not present

## 2023-10-06 DIAGNOSIS — E782 Mixed hyperlipidemia: Secondary | ICD-10-CM | POA: Diagnosis not present

## 2023-10-06 DIAGNOSIS — E611 Iron deficiency: Secondary | ICD-10-CM | POA: Insufficient documentation

## 2023-10-06 DIAGNOSIS — I471 Supraventricular tachycardia, unspecified: Secondary | ICD-10-CM | POA: Diagnosis not present

## 2023-10-06 DIAGNOSIS — R3911 Hesitancy of micturition: Secondary | ICD-10-CM

## 2023-10-06 DIAGNOSIS — I10 Essential (primary) hypertension: Secondary | ICD-10-CM

## 2023-10-06 DIAGNOSIS — E1140 Type 2 diabetes mellitus with diabetic neuropathy, unspecified: Secondary | ICD-10-CM

## 2023-10-06 DIAGNOSIS — Z7985 Long-term (current) use of injectable non-insulin antidiabetic drugs: Secondary | ICD-10-CM | POA: Diagnosis not present

## 2023-10-06 MED ORDER — TAMSULOSIN HCL 0.4 MG PO CAPS
0.4000 mg | ORAL_CAPSULE | Freq: Every day | ORAL | 5 refills | Status: AC
Start: 2023-10-06 — End: ?

## 2023-10-06 NOTE — Assessment & Plan Note (Signed)
 Hb improved now, improved with oral iron supplement for 1 month Has borderline low iron levels Advised to continue ferrous sulfate 325 mg every other day for now

## 2023-10-06 NOTE — Assessment & Plan Note (Signed)
 Tried Crestor , but felt sluggish, switched to pravastatin  Advised to follow low carb, low-cholesterol diet for now

## 2023-10-06 NOTE — Patient Instructions (Addendum)
 Please take ferrous sulfate 325 mg every other day.  Please continue to take other medications as prescribed.  Please continue to follow low carb diet and perform moderate exercise/walking at least 150 mins/week.  Please get fasting blood tests done before the next visit.

## 2023-10-06 NOTE — Assessment & Plan Note (Signed)
 Physical exam as documented. Counseling done  re healthy lifestyle involving commitment to 150 minutes exercise per week, heart healthy diet, and attaining healthy weight.The importance of adequate sleep also discussed. Immunization and cancer screening needs are specifically addressed at this visit.

## 2023-10-06 NOTE — Assessment & Plan Note (Signed)
 BP Readings from Last 1 Encounters:  05/21/23 126/77   Well-controlled  Has Diltiazem for PSVT Counseled for compliance with the medications Advised DASH diet and moderate exercise/walking, at least 150 mins/week

## 2023-10-06 NOTE — Assessment & Plan Note (Signed)
 Chronic weak urinary stream, but only at nighttime, trial of tamsulosin  0.4 mg QD Used to be followed by urology

## 2023-10-06 NOTE — Assessment & Plan Note (Signed)
 Lab Results  Component Value Date   TSH 3.450 05/14/2023   On levothyroxine  150 mcg daily

## 2023-10-06 NOTE — Assessment & Plan Note (Addendum)
 Lab Results  Component Value Date   HGBA1C 5.8 (H) 09/29/2023   Well controlled On Trulicity  3 mg dose currently, refills sent to specialty pharmacy -[employer] application form submitted Advised to follow diabetic diet On pravastatin  F/u CMP and lipid panel Diabetic eye exam: Advised to follow up with Ophthalmology for diabetic eye exam  Had recurrent falls likely due to diabetic neuropathy, on gabapentin  300 mg nightly now Has been evaluated by neurology for neuropathy

## 2023-10-06 NOTE — Progress Notes (Signed)
 Established Patient Office Visit  Subjective:  Patient ID: Justin Klein, male    DOB: Nov 30, 1960  Age: 63 y.o. MRN: [id number]  CC:  Chief Complaint  Patient presents with   Annual Exam    HPI Justin Klein is a 63 y.o. male with past medical history of type II DM, hypothyroidism, HLD and PSVT who presents for annual physical.  Type II DM:  His HbA1c is 5.8 now. He has been taking Trulicity  3 mg qw for it, gets refills from patient assistance program. He denies any polyuria or polydipsia currently.  He takes Pravastatin  for HLD. He denies any chest pain or dyspnea currently.  Diabetic neuropathy: He has history of diabetic neuropathy and has been having gait disturbance. He has had recurrent falls in the past.  He was placed on gabapentin  300 mg nightly later, but has seen only mild improvement. He was evaluated by Neurology, and has been advised to continue Gabapentin . Denies any major injury. He also has history of pernicious anemia, and has taken B12 oral supplements.  He has history of PSVT, but has not had any dizziness or palpitations recently.  He has diltiazem  as needed for palpitations.  Elevated PSA: His PSA level was wnl recently, was borderline elevated at 4.9 in 03/25.  He has weak urinary stream, especially at nighttime, but denies urinary hesitancy or resistance currently.  Denies dysuria or hematuria. He reports having sexual intercourse 1-2 days prior to previous blood draw.  Past Medical History:  Diagnosis Date   Asthma    Asthma 08/26/2011   Bilateral leg weakness 07/21/2012   Diabetes mellitus, type 2 (HCC)    Essential hypertension 10/27/2019   Hypothyroidism    Ingrown nail of great toe of left foot 05/11/2019   Numbness and tingling of both legs 07/21/2012   Pernicious anemia    PSVT (paroxysmal supraventricular tachycardia) (HCC) 08/26/2011   Pulmonary embolus (HCC)    Diagnosed 9/12 at [facility]   Pulmonary embolus (HCC) 08/26/2011   Seborrheic dermatitis     Strain of muscle, fascia and tendon of lower back, initial encounter 01/24/2020   SVT (supraventricular tachycardia) (HCC)    Wart on thumb 08/20/2019    Past Surgical History:  Procedure Laterality Date   COLONOSCOPY WITH PROPOFOL  N/A 09/27/2021   Procedure: COLONOSCOPY WITH PROPOFOL ;  Surgeon: Shaaron Lamar HERO, MD;  Location: [facility];  Service: Endoscopy;  Laterality: N/A;  9:30am   COLONOSCOPY WITH PROPOFOL  N/A 12/02/2022   Procedure: COLONOSCOPY WITH PROPOFOL ;  Surgeon: Shaaron Lamar HERO, MD;  Location: [facility];  Service: Endoscopy;  Laterality: N/A;  8:15 AM, ASA 2   Epidermal cyst resection     Left patella tendon repair      Family History  Problem Relation Age of Onset   Diabetes type II Mother    Cancer Father    Diabetes type II Sister     Social History   Socioeconomic History   Marital status: Divorced    Spouse name: Not on file   Number of children: 3   Years of education: Not on file   Highest education level: 12th grade  Occupational History   Not on file  Tobacco Use   Smoking status: Former    Types: Cigarettes   Smokeless tobacco: Never   Tobacco comments:    2012 quit  Vaping Use   Vaping status: Never Used  Substance and Sexual Activity   Alcohol use: No   Drug use: Yes  Types: Marijuana    Comment: weekly   Sexual activity: Yes  Other Topics Concern   Not on file  Social History Narrative   Lives with fiance' Clarita    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       Enjoys: music-jazz, reading-john grisham      Diet: no pork, salads daily, [place], uses sugar free sweeters, dark chocolate 70% or higher   Caffeine: decaff-sodas, ginger ale, tea-with truiva    Water : 3-4 cups       Wears seat belt   Smoke detectors at home   Does not use phone while driving    Social Drivers of Health   Financial Resource Strain: Low Risk  (01/22/2023)   Overall Financial Resource Strain (CARDIA)    Difficulty of Paying Living Expenses: Not hard at all  Food  Insecurity: No Food Insecurity (01/22/2023)   Hunger Vital Sign    Worried About Running Out of Food in the Last Year: Never true    Ran Out of Food in the Last Year: Never true  Transportation Needs: No Transportation Needs (01/22/2023)   PRAPARE - Administrator, Civil Service (Medical): No    Lack of Transportation (Non-Medical): No  Physical Activity: Sufficiently Active (01/22/2023)   Exercise Vital Sign    Days of Exercise per Week: 7 days    Minutes of Exercise per Session: 30 min  Stress: No Stress Concern Present (01/22/2023)   [employer] of Occupational Health - Occupational Stress Questionnaire    Feeling of Stress : Not at all  Social Connections: Moderately Isolated (01/22/2023)   Social Connection and Isolation Panel    Frequency of Communication with Friends and Family: More than three times a week    Frequency of Social Gatherings with Friends and Family: More than three times a week    Attends Religious Services: More than 4 times per year    Active Member of [employer] or Organizations: No    Attends Banker Meetings: Never    Marital Status: Divorced  Catering manager Violence: Not At Risk (01/22/2023)   Humiliation, Afraid, Rape, and Kick questionnaire    Fear of Current or Ex-Partner: No    Emotionally Abused: No    Physically Abused: No    Sexually Abused: No    Outpatient Medications Prior to Visit  Medication Sig Dispense Refill   albuterol  (VENTOLIN  HFA) 108 (90 Base) MCG/ACT inhaler INHALE 1 OR 2 PUFFS INTO THE LUNGS EVERY SIX HOURS AS NEEDED FOR WHEEZING OR SHORTNESS OF BREATH. 18 g 1   Alcohol Swabs (B-D SINGLE USE SWABS REGULAR) PADS USE AS DIRECTED 100 each 0   budesonide -formoterol  (SYMBICORT ) 80-4.5 MCG/ACT inhaler Inhale 2 puffs into the lungs 2 (two) times daily. 1 each 3   Cholecalciferol (VITAMIN D -3) 25 MCG (1000 UT) CAPS Take 1,000 Units by mouth daily.     Cyanocobalamin  (B-12) 1000 MCG CAPS Take 1,000 mcg by mouth  daily.     diltiazem  (CARDIZEM ) 30 MG tablet TAKE 1 TABLET EVERY DAY 90 tablet 3   fluticasone  (CUTIVATE ) 0.05 % cream APPLY TO AFFECTED AREAS ON FACE UP TO TWICE DAILY AS NEEDED FOR RASH. 60 g 11   gabapentin  (NEURONTIN ) 300 MG capsule Take 1 capsule (300 mg total) by mouth at bedtime. 90 capsule 3   glucose blood test strip Use as instructed. Use twice daily. 100 each 3   levothyroxine  (SYNTHROID ) 150 MCG tablet TAKE 1 TABLET EVERY MORNING 90 tablet 3   Multiple Vitamins-Minerals (  MULTIVITAMIN WITH MINERALS) tablet Take 1 tablet by mouth daily.     Omega-3 Fatty Acids (FISH OIL) 1000 MG CPDR Take 1,000 mg by mouth daily.     pravastatin  (PRAVACHOL ) 20 MG tablet TAKE 1 TABLET BY MOUTH DAILY. 90 tablet 3   sildenafil  (VIAGRA ) 100 MG tablet TAKE 1 TABLET BY MOUTH AS NEEDED FOR ERECTILE DYSFUNCTION 20 tablet 3   TRUEplus Lancets 33G MISC TEST DAILY 100 each 0   TRULICITY  3 MG/0.5ML SOAJ INJECT 3 MG (0.5 ML) UNDER THE SKIN ONCE A WEEK 8 mL 0   UNABLE TO FIND True Metrix meter, True Metrix test strips, lancets, control solution, and alcohol swabs. 1 Product 0   No facility-administered medications prior to visit.    Allergies  Allergen Reactions   Metformin And Related Other (See Comments)    Low B12, gait disturbance, myopathy, neuropathy, pernicious anemia    Crestor  [Rosuvastatin ] Other (See Comments)    Made me feel sluggish and drained Planning to retry on 12/14/20   Shellfish Allergy Nausea And Vomiting    Seafood-upset stomach    ROS Review of Systems  Constitutional:  Negative for chills and fever.  HENT:  Negative for congestion and sore throat.   Eyes:  Negative for pain and discharge.  Respiratory:  Negative for cough and shortness of breath.   Cardiovascular:  Negative for chest pain and palpitations.  Gastrointestinal:  Negative for constipation, diarrhea, nausea and vomiting.  Endocrine: Negative for polydipsia and polyuria.  Genitourinary:  Negative for dysuria and  hematuria.  Musculoskeletal:  Positive for back pain. Negative for neck pain and neck stiffness.  Skin:  Negative for rash.  Neurological:  Positive for numbness (B/l feet). Negative for dizziness, weakness and headaches.  Psychiatric/Behavioral:  Negative for agitation and behavioral problems.       Objective:    Physical Exam Vitals reviewed.  Constitutional:      General: He is not in acute distress.    Appearance: He is not diaphoretic.  HENT:     Head: Normocephalic and atraumatic.     Nose: Nose normal.     Mouth/Throat:     Mouth: Mucous membranes are moist.  Eyes:     General: No scleral icterus.    Extraocular Movements: Extraocular movements intact.  Cardiovascular:     Rate and Rhythm: Normal rate and regular rhythm.     Heart sounds: Normal heart sounds. No murmur heard. Pulmonary:     Breath sounds: Normal breath sounds. No wheezing or rales.  Abdominal:     Palpations: Abdomen is soft.     Tenderness: There is no abdominal tenderness.  Musculoskeletal:     Cervical back: Neck supple. No tenderness.     Lumbar back: Tenderness (Lower lumbar and right paraspinal area) present. Negative right straight leg raise test and negative left straight leg raise test.     Right lower leg: No edema.     Left lower leg: No edema.  Skin:    General: Skin is warm.     Findings: No rash.  Neurological:     General: No focal deficit present.     Mental Status: He is alert and oriented to person, place, and time.     Cranial Nerves: No cranial nerve deficit.     Sensory: Sensory deficit (B/l feet) present.     Motor: No weakness.  Psychiatric:        Mood and Affect: Mood normal.  Behavior: Behavior normal.     BP 127/75   Pulse 69   Ht 5' 9 (1.753 m)   Wt 198 lb 12.8 oz (90.2 kg)   SpO2 93%   BMI 29.36 kg/m  Wt Readings from Last 3 Encounters:  10/06/23 198 lb 12.8 oz (90.2 kg)  05/21/23 204 lb 9.6 oz (92.8 kg)  05/12/23 197 lb (89.4 kg)    Lab  Results  Component Value Date   TSH 3.450 05/14/2023   Lab Results  Component Value Date   WBC 3.8 09/29/2023   HGB 13.5 09/29/2023   HCT 43.0 09/29/2023   MCV 92 09/29/2023   PLT 200 09/29/2023   Lab Results  Component Value Date   NA 136 09/29/2023   K 4.8 09/29/2023   CO2 23 09/29/2023   GLUCOSE 99 09/29/2023   BUN 11 09/29/2023   CREATININE 1.14 09/29/2023   BILITOT 0.3 09/29/2023   ALKPHOS 94 09/29/2023   AST 24 09/29/2023   ALT 14 09/29/2023   PROT 7.2 09/29/2023   ALBUMIN 4.4 09/29/2023   CALCIUM  10.4 (H) 09/29/2023   EGFR 73 09/29/2023   Lab Results  Component Value Date   CHOL 133 01/15/2023   Lab Results  Component Value Date   HDL 48 01/15/2023   Lab Results  Component Value Date   LDLCALC 74 01/15/2023   Lab Results  Component Value Date   TRIG 50 01/15/2023   Lab Results  Component Value Date   CHOLHDL 2.8 01/15/2023   Lab Results  Component Value Date   HGBA1C 5.8 (H) 09/29/2023      Assessment & Plan:   Problem List Items Addressed This Visit       Cardiovascular and Mediastinum   Essential hypertension (Chronic)   BP Readings from Last 1 Encounters:  05/21/23 126/77   Well-controlled  Has Diltiazem  for PSVT Counseled for compliance with the medications Advised DASH diet and moderate exercise/walking, at least 150 mins/week      Relevant Orders   CMP14+EGFR   CBC with Differential/Platelet   PSVT (paroxysmal supraventricular tachycardia) (HCC)   Asymptomatic currently Has diltiazem  as needed, as has not required it for a long time        Endocrine   Type 2 diabetes mellitus with diabetic neuropathy, without long-term current use of insulin (HCC) (Chronic)   Lab Results  Component Value Date   HGBA1C 5.8 (H) 09/29/2023   Well controlled On Trulicity  3 mg dose currently, refills [facility] to specialty [facility] form submitted Advised to follow diabetic diet On pravastatin  F/u CMP and lipid  panel Diabetic eye exam: Advised to follow up with Ophthalmology for diabetic eye exam  Had recurrent falls likely due to diabetic neuropathy, on gabapentin  300 mg nightly now Has been evaluated by neurology for neuropathy      Relevant Orders   Microalbumin / creatinine urine ratio   CMP14+EGFR   Hemoglobin A1c   Hypothyroidism (Chronic)   Lab Results  Component Value Date   TSH 3.450 05/14/2023   On levothyroxine  150 mcg daily      Relevant Orders   TSH + free T4     Other   Hyperlipidemia (Chronic)   Tried Crestor , but felt sluggish, switched to pravastatin  Advised to follow low carb, low-cholesterol diet for now      Relevant Orders   Lipid Profile   Urinary hesitancy   Chronic weak urinary stream, but only at nighttime, trial of tamsulosin  0.4 mg  QD Used to be followed by urology      Relevant Medications   tamsulosin  (FLOMAX ) 0.4 MG CAPS capsule   Encounter for general adult medical examination with abnormal findings - Primary   Physical exam as documented. Counseling done  re healthy lifestyle involving commitment to 150 minutes exercise per week, heart healthy diet, and attaining healthy weight.The importance of adequate sleep also discussed. Immunization and cancer screening needs are specifically addressed at this visit.      Iron deficiency   Hb improved now, improved with oral iron supplement for 1 month Has borderline low iron levels Advised to continue ferrous sulfate 325 mg every other day for now         Meds ordered this encounter  Medications   tamsulosin  (FLOMAX ) 0.4 MG CAPS capsule    Sig: Take 1 capsule (0.4 mg total) by mouth daily.    Dispense:  30 capsule    Refill:  5    Follow-up: Return in about 4 months (around 02/05/2024) for DM.    Suzzane MARLA Blanch, MD

## 2023-10-06 NOTE — Assessment & Plan Note (Signed)
 Asymptomatic currently ?Has diltiazem as needed, as has not required it for a long time ?

## 2023-10-08 ENCOUNTER — Other Ambulatory Visit: Payer: Self-pay | Admitting: Internal Medicine

## 2023-10-08 DIAGNOSIS — E039 Hypothyroidism, unspecified: Secondary | ICD-10-CM

## 2023-10-15 DIAGNOSIS — E1140 Type 2 diabetes mellitus with diabetic neuropathy, unspecified: Secondary | ICD-10-CM | POA: Diagnosis not present

## 2023-10-17 LAB — MICROALBUMIN / CREATININE URINE RATIO
Creatinine, Urine: 283.8 mg/dL
Microalb/Creat Ratio: 8 mg/g{creat} (ref 0–29)
Microalbumin, Urine: 22 ug/mL

## 2023-10-29 ENCOUNTER — Other Ambulatory Visit: Payer: Self-pay | Admitting: Internal Medicine

## 2023-10-29 DIAGNOSIS — E782 Mixed hyperlipidemia: Secondary | ICD-10-CM

## 2023-11-21 ENCOUNTER — Other Ambulatory Visit: Payer: Self-pay | Admitting: Internal Medicine

## 2023-11-21 ENCOUNTER — Encounter: Payer: Self-pay | Admitting: Internal Medicine

## 2023-11-21 DIAGNOSIS — N529 Male erectile dysfunction, unspecified: Secondary | ICD-10-CM

## 2023-11-21 DIAGNOSIS — E1140 Type 2 diabetes mellitus with diabetic neuropathy, unspecified: Secondary | ICD-10-CM

## 2023-11-21 MED ORDER — SILDENAFIL CITRATE 100 MG PO TABS: ORAL_TABLET | ORAL | 3 refills | Status: AC

## 2023-12-10 ENCOUNTER — Other Ambulatory Visit: Payer: Self-pay | Admitting: Internal Medicine

## 2023-12-10 DIAGNOSIS — E1140 Type 2 diabetes mellitus with diabetic neuropathy, unspecified: Secondary | ICD-10-CM

## 2024-01-08 MED ORDER — TRULICITY 3 MG/0.5ML ~~LOC~~ SOAJ: 3.0000 mg | SUBCUTANEOUS | 3 refills | Status: AC

## 2024-01-08 NOTE — Addendum Note (Signed)
 Addended byBETHA TOBIE DOWNS on: 01/08/2024 05:52 PM   Modules accepted: Orders

## 2024-01-26 ENCOUNTER — Ambulatory Visit: Payer: Medicare HMO

## 2024-01-26 VITALS — Ht 69.0 in | Wt 200.0 lb

## 2024-01-26 DIAGNOSIS — Z0000 Encounter for general adult medical examination without abnormal findings: Secondary | ICD-10-CM

## 2024-01-26 DIAGNOSIS — Z2821 Immunization not carried out because of patient refusal: Secondary | ICD-10-CM

## 2024-01-26 NOTE — Progress Notes (Addendum)
 I connected with  Justin Klein on 01/26/24 by a audio enabled telemedicine application and verified that I am speaking with the correct person using two identifiers.  Patient Location: Home  Provider Location: Office/Clinic  I discussed the limitations of evaluation and management by telemedicine. The patient expressed understanding and agreed to proceed.  Chief Complaint  Patient presents with   Medicare Wellness     Subjective:   Justin Klein is a 63 y.o. male who presents for a Medicare Annual Wellness Visit.  Allergies (verified) Metformin and related, Crestor  [rosuvastatin ], and Shellfish allergy   History: Past Medical History:  Diagnosis Date   Allergy    Arthritis    Asthma    Asthma 08/26/2011   Bilateral leg weakness 07/21/2012   Blood transfusion without reported diagnosis    Diabetes mellitus, type 2 (HCC)    Essential hypertension 10/27/2019   Hypothyroidism    Ingrown nail of great toe of left foot 05/11/2019   Numbness and tingling of both legs 07/21/2012   Pernicious anemia    PSVT (paroxysmal supraventricular tachycardia) 08/26/2011   Pulmonary embolus (HCC)    Diagnosed 9/12 at [place]   Pulmonary embolus (HCC) 08/26/2011   Seborrheic dermatitis    Strain of muscle, fascia and tendon of lower back, initial encounter 01/24/2020   SVT (supraventricular tachycardia)    Wart on thumb 08/20/2019   Past Surgical History:  Procedure Laterality Date   COLONOSCOPY WITH PROPOFOL  N/A 09/27/2021   Procedure: COLONOSCOPY WITH PROPOFOL ;  Surgeon: Shaaron Lamar HERO, MD;  Location: [facility];  Service: Endoscopy;  Laterality: N/A;  9:30am   COLONOSCOPY WITH PROPOFOL  N/A 12/02/2022   Procedure: COLONOSCOPY WITH PROPOFOL ;  Surgeon: Shaaron Lamar HERO, MD;  Location: [facility];  Service: Endoscopy;  Laterality: N/A;  8:15 AM, ASA 2   Epidermal cyst resection     Left patella tendon repair     Family History  Problem Relation Age of Onset   Diabetes  type II Mother    Diabetes Mother    Cancer Father    Alcohol abuse Father    Diabetes type II Sister    Kidney disease Maternal Grandfather    Stroke Maternal Grandmother    Social History   Occupational History   Not on file  Tobacco Use   Smoking status: Former    Current packs/day: 0.00    Average packs/day: 0.3 packs/day for 31.7 years (7.9 ttl pk-yrs)    Types: Cigarettes    Start date: 44    Quit date: 10/25/2010    Years since quitting: 13.2   Smokeless tobacco: Never   Tobacco comments:    2012 quit  Vaping Use   Vaping status: Never Used  Substance and Sexual Activity   Alcohol use: Not Currently    Alcohol/week: 6.0 standard drinks of alcohol    Types: 6 Cans of beer per week   Drug use: Yes    Types: Marijuana    Comment: weekly   Sexual activity: Yes    Birth control/protection: None   Tobacco Counseling Counseling given: Yes Tobacco comments: 2012 quit  SDOH Screenings   Food Insecurity: Food Insecurity Present (01/23/2024)  Housing: Low Risk  (01/23/2024)  Transportation Needs: No Transportation Needs (01/23/2024)  Utilities: Not At Risk (01/26/2024)  Alcohol Screen: Low Risk  (01/23/2024)  Depression (PHQ2-9): Low Risk  (01/26/2024)  Financial Resource Strain: Medium Risk (01/23/2024)  Physical Activity: Insufficiently Active (01/23/2024)  Social Connections: Socially Integrated (01/23/2024)  Stress: No Stress Concern Present (01/23/2024)  Tobacco Use: Medium Risk (01/26/2024)  Health Literacy: Adequate Health Literacy (01/26/2024)   See flowsheets for full screening details  Depression Screen PHQ 2 & 9 Depression Scale- Over the past 2 weeks, how often have you been bothered by any of the following problems? Little interest or pleasure in doing things: 0 Feeling down, depressed, or hopeless (PHQ Adolescent also includes...irritable): 0 PHQ-2 Total Score: 0 Trouble falling or staying asleep, or sleeping too much: 0 Feeling tired or having  little energy: 0 Poor appetite or overeating (PHQ Adolescent also includes...weight loss): 0 Feeling bad about yourself - or that you are a failure or have let yourself or your family down: 0 Trouble concentrating on things, such as reading the newspaper or watching television (PHQ Adolescent also includes...like school work): 0 Moving or speaking so slowly that other people could have noticed. Or the opposite - being so fidgety or restless that you have been moving around a lot more than usual: 0 Thoughts that you would be better off dead, or of hurting yourself in some way: 0 PHQ-9 Total Score: 0 If you checked off any problems, how difficult have these problems made it for you to do your work, take care of things at home, or get along with other people?: Not difficult at all  Depression Treatment Depression Interventions/Treatment : EYV7-0 Score <4 Follow-up Not Indicated     Goals Addressed               This Visit's Progress     I'd like to get in better shape and lose a little weight (pt-stated)         Visit info / Clinical Intake: Medicare Wellness Visit Type:: Subsequent Annual Wellness Visit Persons participating in visit:: patient Medicare Wellness Visit Mode:: Telephone If telephone:: video declined Because this visit was a virtual/telehealth visit:: pt reported vitals If Telephone or Video please confirm:: I connected with the patient using audio enabled telemedicine application and verified that I am speaking with the correct person using two identifiers; I discussed the limitations of evaluation and management by telemedicine; The patient expressed understanding and agreed to proceed Patient Location:: home Provider Location:: office Information given by:: patient Interpreter Needed?: No Pre-visit prep was completed: yes AWV questionnaire completed by patient prior to visit?: yes Date:: 01/23/24 Living arrangements:: lives with spouse/significant other Patient's  Overall Health Status Rating: good Typical amount of pain: none Does pain affect daily life?: no Are you currently prescribed opioids?: (!) yes  Dietary Habits and Nutritional Risks How many meals a day?: 2 Eats fruit and vegetables daily?: yes Most meals are obtained by: preparing own meals In the last 2 weeks, have you had any of the following?: none Diabetic:: (!) yes Any non-healing wounds?: no How often do you check your BS?: 2 Would you like to be referred to a Nutritionist or for Diabetic Management? : no  Functional Status Activities of Daily Living (to include ambulation/medication): (Patient-Rptd) Independent Ambulation: (Patient-Rptd) Independent Medication Administration: Independent Home Management: (Patient-Rptd) Independent Manage your own finances?: yes Primary transportation is: driving Concerns about vision?: no *vision screening is required for WTM* Concerns about hearing?: no  Fall Screening Falls in the past year?: (Patient-Rptd) 1 Number of falls in past year: (Patient-Rptd) 1 Was there an injury with Fall?: (Patient-Rptd) 0 Fall Risk Category Calculator: (Patient-Rptd) 2 Patient Fall Risk Level: (Patient-Rptd) Moderate Fall Risk  Fall Risk Patient at Risk for Falls Due to: Impaired balance/gait; Impaired  mobility; Other (Comment) (diabetic neuropathy) Fall risk Follow up: Falls evaluation completed; Education provided  Home and Transportation Safety: All rugs have non-skid backing?: (!) no All stairs or steps have railings?: yes Grab bars in the bathtub or shower?: yes Have non-skid surface in bathtub or shower?: (!) no Good home lighting?: yes Regular seat belt use?: yes Hospital stays in the last year:: no  Cognitive Assessment Difficulty concentrating, remembering, or making decisions? : no Will 6CIT or Mini Cog be Completed: no 6CIT or Mini Cog Declined: patient alert, oriented, able to answer questions appropriately and recall recent  events  Advance Directives (For Healthcare) Does Patient Have a Medical Advance Directive?: No Would patient like information on creating a medical advance directive?: Yes (MAU/Ambulatory/Procedural Areas - Information given)  Reviewed/Updated  Reviewed/Updated: Reviewed All (Medical, Surgical, Family, Medications, Allergies, Care Teams, Patient Goals)        Objective:    Today's Vitals   01/26/24 0803  Weight: 200 lb (90.7 kg)  Height: 5' 9 (1.753 m)   Body mass index is 29.53 kg/m.  Current Medications (verified) Outpatient Encounter Medications as of 01/26/2024  Medication Sig   albuterol  (VENTOLIN  HFA) 108 (90 Base) MCG/ACT inhaler INHALE 1 OR 2 PUFFS INTO THE LUNGS EVERY SIX HOURS AS NEEDED FOR WHEEZING OR SHORTNESS OF BREATH.   Alcohol Swabs (B-D SINGLE USE SWABS REGULAR) PADS USE AS DIRECTED   budesonide -formoterol  (SYMBICORT ) 80-4.5 MCG/ACT inhaler Inhale 2 puffs into the lungs 2 (two) times daily.   Cholecalciferol (VITAMIN D -3) 25 MCG (1000 UT) CAPS Take 1,000 Units by mouth daily.   Cyanocobalamin  (B-12) 1000 MCG CAPS Take 1,000 mcg by mouth daily.   diltiazem  (CARDIZEM ) 30 MG tablet TAKE 1 TABLET EVERY DAY   Dulaglutide  (TRULICITY ) 3 MG/0.5ML SOAJ Inject 3 mg into the skin every 7 (seven) days.   fluticasone  (CUTIVATE ) 0.05 % cream APPLY TO AFFECTED AREAS ON FACE UP TO TWICE DAILY AS NEEDED FOR RASH.   gabapentin  (NEURONTIN ) 300 MG capsule Take 1 capsule (300 mg total) by mouth at bedtime.   levothyroxine  (SYNTHROID ) 150 MCG tablet TAKE 1 TABLET EVERY MORNING   Multiple Vitamins-Minerals (MULTIVITAMIN WITH MINERALS) tablet Take 1 tablet by mouth daily.   Omega-3 Fatty Acids (FISH OIL) 1000 MG CPDR Take 1,000 mg by mouth daily.   ONETOUCH ULTRA TEST test strip USE AS INSTRUCTED. USE TWICE DAILY.   pravastatin  (PRAVACHOL ) 20 MG tablet TAKE 1 TABLET EVERY DAY   sildenafil  (VIAGRA ) 100 MG tablet TAKE 1 TABLET BY MOUTH AS NEEDED FOR ERECTILE DYSFUNCTION   tamsulosin   (FLOMAX ) 0.4 MG CAPS capsule Take 1 capsule (0.4 mg total) by mouth daily.   TRUEplus Lancets 33G MISC TEST DAILY   [DISCONTINUED] UNABLE TO FIND True Metrix meter, True Metrix test strips, lancets, control solution, and alcohol swabs.   No facility-administered encounter medications on file as of 01/26/2024.   Hearing/Vision screen Hearing Screening - Comments:: Patient denies any hearing difficulties.   Vision Screening - Comments:: Patient is not up to date on yearly eye exams. He missed his last appt. Sees Dr. Estelle in [place]. Patient will call to reschedule that missed appt.   Immunizations and Health Maintenance Health Maintenance  Topic Date Due   Pneumococcal Vaccine: 50+ Years (1 of 2 - PCV) Never done   Influenza Vaccine  Never done   COVID-19 Vaccine (3 - 2025-26 season) 10/27/2023   Medicare Annual Wellness (AWV)  01/22/2024   OPHTHALMOLOGY EXAM  03/03/2024   HEMOGLOBIN A1C  03/31/2024  Diabetic kidney evaluation - eGFR measurement  09/28/2024   FOOT EXAM  10/05/2024   Diabetic kidney evaluation - Urine ACR  10/14/2024   DTaP/Tdap/Td (2 - Td or Tdap) 06/14/2031   Colonoscopy  12/01/2032   Hepatitis C Screening  Completed   HIV Screening  Completed   Zoster Vaccines- Shingrix  Completed   Hepatitis B Vaccines 19-59 Average Risk  Aged Out   HPV VACCINES  Aged Out   Meningococcal B Vaccine  Aged Out        Assessment/Plan:  This is a routine wellness examination for [place].  Patient Care Team: Tobie Suzzane POUR, MD as PCP - General (Internal Medicine) Jacinto Lonni PARAS, [facility] (Inactive) (Pharmacist) Estelle Fallow [facility])  I have personally reviewed and noted the following in the patient's chart:   Medical and social history Use of alcohol, tobacco or illicit drugs  Current medications and supplements including opioid prescriptions. Functional ability and status Nutritional status Physical activity Advanced directives List of other  physicians Hospitalizations, surgeries, and ER visits in previous 12 months Vitals Screenings to include cognitive, depression, and falls Referrals and appointments  No orders of the defined types were placed in this encounter.  In addition, I have reviewed and discussed with patient certain preventive protocols, quality metrics, and best practice recommendations. A written personalized care plan for preventive services as well as general preventive health recommendations were provided to patient.   Kylar Speelman, CMA   01/26/2024   No follow-ups on file.  After Visit Summary: (MyChart) Due to this being a telephonic visit, the after visit summary with patients personalized plan was offered to patient via MyChart   Nurse Notes: Patient will call Dr. Estelle (optometry) to schedule screening

## 2024-01-26 NOTE — Patient Instructions (Signed)
 Mr. Justin Klein,  Thank you for taking the time for your Medicare Wellness Visit. I appreciate your continued commitment to your health goals. Please review the care plan we discussed, and feel free to reach out if I can assist you further.  Please note that Annual Wellness Visits do not include a physical exam. Some assessments may be limited, especially if the visit was conducted virtually. If needed, we may recommend an in-person follow-up with your provider.  Ongoing Care Seeing your primary care provider every 3 to 6 months helps us  monitor your health and provide consistent, personalized care.   Referrals If a referral was made during today's visit and you haven't received any updates within two weeks, please contact the referred provider directly to check on the status.  No referrals placed today  Recommended Screenings:  Health Maintenance  Topic Date Due   Pneumococcal Vaccine for age over 65 (1 of 2 - PCV) Never done   Flu Shot  Never done   COVID-19 Vaccine (3 - 2025-26 season) 10/27/2023   Eye exam for diabetics  03/03/2024   Hemoglobin A1C  03/31/2024   Yearly kidney function blood test for diabetes  09/28/2024   Complete foot exam   10/05/2024   Yearly kidney health urinalysis for diabetes  10/14/2024   Medicare Annual Wellness Visit  01/25/2025   DTaP/Tdap/Td vaccine (2 - Td or Tdap) 06/14/2031   Colon Cancer Screening  12/01/2032   Hepatitis C Screening  Completed   HIV Screening  Completed   Zoster (Shingles) Vaccine  Completed   Hepatitis B Vaccine  Aged Out   HPV Vaccine  Aged Out   Meningitis B Vaccine  Aged Out       01/23/2024    3:19 AM  Advanced Directives  Does Patient Have a Medical Advance Directive? No  Would patient like information on creating a medical advance directive? Yes (MAU/Ambulatory/Procedural Areas - Information given)    Vision: Annual vision screenings are recommended for early detection of glaucoma, cataracts, and diabetic  retinopathy. These exams can also reveal signs of chronic conditions such as diabetes and high blood pressure.  Dental: Annual dental screenings help detect early signs of oral cancer, gum disease, and other conditions linked to overall health, including heart disease and diabetes.  Please see the attached documents for additional preventive care recommendations.

## 2024-02-03 ENCOUNTER — Telehealth: Payer: Self-pay

## 2024-02-03 DIAGNOSIS — E039 Hypothyroidism, unspecified: Secondary | ICD-10-CM | POA: Diagnosis not present

## 2024-02-03 DIAGNOSIS — E1140 Type 2 diabetes mellitus with diabetic neuropathy, unspecified: Secondary | ICD-10-CM | POA: Diagnosis not present

## 2024-02-03 DIAGNOSIS — I10 Essential (primary) hypertension: Secondary | ICD-10-CM | POA: Diagnosis not present

## 2024-02-03 NOTE — Telephone Encounter (Signed)
 Currently enrolled with [employer] patient assistance for Trulicity  medication until 02/25/24.

## 2024-02-04 LAB — CBC WITH DIFFERENTIAL/PLATELET
Basophils Absolute: 0 x10E3/uL (ref 0.0–0.2)
Basos: 1 %
EOS (ABSOLUTE): 0.1 x10E3/uL (ref 0.0–0.4)
Eos: 2 %
Hematocrit: 44.3 % (ref 37.5–51.0)
Hemoglobin: 14.3 g/dL (ref 13.0–17.7)
Immature Grans (Abs): 0 x10E3/uL (ref 0.0–0.1)
Immature Granulocytes: 0 %
Lymphocytes Absolute: 1.2 x10E3/uL (ref 0.7–3.1)
Lymphs: 36 %
MCH: 29.4 pg (ref 26.6–33.0)
MCHC: 32.3 g/dL (ref 31.5–35.7)
MCV: 91 fL (ref 79–97)
Monocytes Absolute: 0.4 x10E3/uL (ref 0.1–0.9)
Monocytes: 11 %
Neutrophils Absolute: 1.7 x10E3/uL (ref 1.4–7.0)
Neutrophils: 50 %
Platelets: 197 x10E3/uL (ref 150–450)
RBC: 4.87 x10E6/uL (ref 4.14–5.80)
RDW: 13.5 % (ref 11.6–15.4)
WBC: 3.3 x10E3/uL — ABNORMAL LOW (ref 3.4–10.8)

## 2024-02-04 LAB — TSH+FREE T4
Free T4: 1.33 ng/dL (ref 0.82–1.77)
TSH: 2.92 u[IU]/mL (ref 0.450–4.500)

## 2024-02-04 LAB — CMP14+EGFR
ALT: 14 IU/L (ref 0–44)
AST: 25 IU/L (ref 0–40)
Albumin: 4.6 g/dL (ref 3.9–4.9)
Alkaline Phosphatase: 80 IU/L (ref 47–123)
BUN/Creatinine Ratio: 10 (ref 10–24)
BUN: 11 mg/dL (ref 8–27)
Bilirubin Total: 0.7 mg/dL (ref 0.0–1.2)
CO2: 22 mmol/L (ref 20–29)
Calcium: 10.8 mg/dL — ABNORMAL HIGH (ref 8.6–10.2)
Chloride: 100 mmol/L (ref 96–106)
Creatinine, Ser: 1.13 mg/dL (ref 0.76–1.27)
Globulin, Total: 2.7 g/dL (ref 1.5–4.5)
Glucose: 88 mg/dL (ref 70–99)
Potassium: 4.6 mmol/L (ref 3.5–5.2)
Sodium: 136 mmol/L (ref 134–144)
Total Protein: 7.3 g/dL (ref 6.0–8.5)
eGFR: 73 mL/min/1.73 (ref >59)

## 2024-02-04 LAB — HEMOGLOBIN A1C
Est. average glucose Bld gHb Est-mCnc: 120 mg/dL
Hgb A1c MFr Bld: 5.8 % — ABNORMAL HIGH (ref 4.8–5.6)

## 2024-02-04 LAB — LIPID PANEL
Chol/HDL Ratio: 3.2 ratio (ref 0.0–5.0)
Cholesterol, Total: 156 mg/dL (ref 100–199)
HDL: 49 mg/dL (ref >39)
LDL Chol Calc (NIH): 97 mg/dL (ref 0–99)
Triglycerides: 48 mg/dL (ref 0–149)
VLDL Cholesterol Cal: 10 mg/dL (ref 5–40)

## 2024-02-09 ENCOUNTER — Ambulatory Visit: Admitting: Internal Medicine

## 2024-02-09 ENCOUNTER — Encounter: Payer: Self-pay | Admitting: Internal Medicine

## 2024-02-09 VITALS — BP 136/74 | HR 85 | Ht 69.0 in | Wt 200.0 lb

## 2024-02-09 DIAGNOSIS — N401 Enlarged prostate with lower urinary tract symptoms: Secondary | ICD-10-CM | POA: Diagnosis not present

## 2024-02-09 DIAGNOSIS — E039 Hypothyroidism, unspecified: Secondary | ICD-10-CM

## 2024-02-09 DIAGNOSIS — R351 Nocturia: Secondary | ICD-10-CM | POA: Diagnosis not present

## 2024-02-09 DIAGNOSIS — E782 Mixed hyperlipidemia: Secondary | ICD-10-CM

## 2024-02-09 DIAGNOSIS — I10 Essential (primary) hypertension: Secondary | ICD-10-CM

## 2024-02-09 DIAGNOSIS — Z7985 Long-term (current) use of injectable non-insulin antidiabetic drugs: Secondary | ICD-10-CM

## 2024-02-09 DIAGNOSIS — E1140 Type 2 diabetes mellitus with diabetic neuropathy, unspecified: Secondary | ICD-10-CM

## 2024-02-09 DIAGNOSIS — I471 Supraventricular tachycardia, unspecified: Secondary | ICD-10-CM | POA: Diagnosis not present

## 2024-02-09 MED ORDER — PRAVASTATIN SODIUM 40 MG PO TABS: 40.0000 mg | ORAL_TABLET | Freq: Every day | ORAL | 3 refills | Status: AC

## 2024-02-09 NOTE — Assessment & Plan Note (Signed)
 Asymptomatic currently ?Has diltiazem as needed, as has not required it for a long time ?

## 2024-02-09 NOTE — Assessment & Plan Note (Signed)
 Lab Results  Component Value Date   HGBA1C 5.8 (H) 02/03/2024   Well controlled On Trulicity  3 mg dose currently, refills sent to specialty pharmacy -[employer] application form submitted Advised to follow diabetic diet On pravastatin  F/u CMP and lipid panel Diabetic eye exam: Advised to follow up with Ophthalmology for diabetic eye exam  Had recurrent falls likely due to diabetic neuropathy in the past, on gabapentin  300 mg nightly now Has been evaluated by neurology for neuropathy

## 2024-02-09 NOTE — Assessment & Plan Note (Signed)
 Lab Results  Component Value Date   TSH 2.920 02/03/2024   On levothyroxine  150 mcg daily

## 2024-02-09 NOTE — Assessment & Plan Note (Signed)
Last CMP showed borderline elevated calcium Recheck BMP and intact PTH

## 2024-02-09 NOTE — Assessment & Plan Note (Addendum)
 On pravastatin  20 mg QD Due to LDL elevation, increased dose of pravastatin  to 40 mg QD Did not tolerate Crestor  Advised to follow low carb, low-cholesterol diet for now

## 2024-02-09 NOTE — Progress Notes (Signed)
 Established Patient Office Visit  Subjective:  Patient ID: Justin Klein, male    DOB: [birth year]  Age: 63 y.o. MRN: [id number]  CC:  Chief Complaint  Patient presents with   Diabetes    Four month follow up     HPI GEORG ANG is a 63 y.o. male with past medical history of type II DM, hypothyroidism, HLD and PSVT who presents for f/u of his chronic medical conditions.  Type II DM:  His HbA1c is 5.8 now. He has been taking Trulicity  3 mg qw for it, gets refills from patient assistance program. He denies any polyuria or polydipsia currently.  He takes Pravastatin  for HLD. He denies any chest pain or dyspnea currently.  Diabetic neuropathy: He has history of diabetic neuropathy and has been having gait disturbance. He has had recurrent falls in the past.  He was placed on gabapentin  300 mg nightly later, but has seen only mild improvement. He was evaluated by Neurology, and has been advised to continue Gabapentin . Denies any major injury. He also has history of pernicious anemia, and has taken B12 oral supplements.  He has history of PSVT, but has not had any dizziness or palpitations recently.  He has diltiazem  as needed for palpitations.   Past Medical History:  Diagnosis Date   Allergy    Arthritis    Asthma    Asthma 08/26/2011   Bilateral leg weakness 07/21/2012   Blood transfusion without reported diagnosis    Diabetes mellitus, type 2 (HCC)    Essential hypertension 10/27/2019   Hypothyroidism    Ingrown nail of great toe of left foot 05/11/2019   Numbness and tingling of both legs 07/21/2012   Pernicious anemia    PSVT (paroxysmal supraventricular tachycardia) 08/26/2011   Pulmonary embolus (HCC)    Diagnosed 9/12 at [place]   Pulmonary embolus (HCC) 08/26/2011   Seborrheic dermatitis    Strain of muscle, fascia and tendon of lower back, initial encounter 01/24/2020   SVT (supraventricular tachycardia)    Wart on thumb 08/20/2019    Past Surgical  History:  Procedure Laterality Date   COLONOSCOPY WITH PROPOFOL  N/A 09/27/2021   Procedure: COLONOSCOPY WITH PROPOFOL ;  Surgeon: Shaaron Lamar HERO, MD;  Location: [facility];  Service: Endoscopy;  Laterality: N/A;  9:30am   COLONOSCOPY WITH PROPOFOL  N/A 12/02/2022   Procedure: COLONOSCOPY WITH PROPOFOL ;  Surgeon: Shaaron Lamar HERO, MD;  Location: [facility];  Service: Endoscopy;  Laterality: N/A;  8:15 AM, ASA 2   Epidermal cyst resection     Left patella tendon repair      Family History  Problem Relation Age of Onset   Diabetes type II Mother    Diabetes Mother    Cancer Father    Alcohol abuse Father    Diabetes type II Sister    Kidney disease Maternal Grandfather    Stroke Maternal Grandmother     Social History   Socioeconomic History   Marital status: Divorced    Spouse name: Not on file   Number of children: 3   Years of education: Not on file   Highest education level: Some college, no degree  Occupational History   Not on file  Tobacco Use   Smoking status: Former    Current packs/day: 0.00    Average packs/day: 0.3 packs/day for 31.7 years (7.9 ttl pk-yrs)    Types: Cigarettes    Start date: 48    Quit date: 10/25/2010    Years  since quitting: 13.3   Smokeless tobacco: Never   Tobacco comments:    2012 quit  Vaping Use   Vaping status: Never Used  Substance and Sexual Activity   Alcohol use: Not Currently    Alcohol/week: 6.0 standard drinks of alcohol    Types: 6 Cans of beer per week   Drug use: Yes    Types: Marijuana    Comment: weekly   Sexual activity: Yes    Birth control/protection: None  Other Topics Concern   Not on file  Social History Narrative   Lives with fiance' Clarita    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       Enjoys: music-jazz, reading-john grisham      Diet: no pork, salads daily, turkey, uses sugar free sweeters, dark chocolate 70% or higher   Caffeine: decaff-sodas, ginger ale, tea-with truiva    Water : 3-4 cups       Wears seat belt    Smoke detectors at home   Does not use phone while driving    Social Drivers of Health   Tobacco Use: Medium Risk (02/09/2024)   Patient History    Smoking Tobacco Use: Former    Smokeless Tobacco Use: Never    Passive Exposure: Not on file  Financial Resource Strain: Medium Risk (01/23/2024)   Overall Financial Resource Strain (CARDIA)    Difficulty of Paying Living Expenses: Somewhat hard  Food Insecurity: Food Insecurity Present (01/23/2024)   Epic    Worried About Programme Researcher, Broadcasting/film/video in the Last Year: Sometimes true    Ran Out of Food in the Last Year: Never true  Transportation Needs: No Transportation Needs (01/23/2024)   Epic    Lack of Transportation (Medical): No    Lack of Transportation (Non-Medical): No  Physical Activity: Insufficiently Active (01/23/2024)   Exercise Vital Sign    Days of Exercise per Week: 2 days    Minutes of Exercise per Session: 20 min  Stress: No Stress Concern Present (01/23/2024)   [employer] of Occupational Health - Occupational Stress Questionnaire    Feeling of Stress: Not at all  Social Connections: Socially Integrated (01/23/2024)   Social Connection and Isolation Panel    Frequency of Communication with Friends and Family: More than three times a week    Frequency of Social Gatherings with Friends and Family: Once a week    Attends Religious Services: 1 to 4 times per year    Active Member of [employer] or Organizations: Yes    Attends Banker Meetings: Never    Marital Status: Living with partner  Intimate Partner Violence: Not At Risk (01/26/2024)   Epic    Fear of Current or Ex-Partner: No    Emotionally Abused: No    Physically Abused: No    Sexually Abused: No  Depression (PHQ2-9): Low Risk (02/09/2024)   Depression (PHQ2-9)    PHQ-2 Score: 0  Alcohol Screen: Low Risk (01/23/2024)   Alcohol Screen    Last Alcohol Screening Score (AUDIT): 5  Housing: Low Risk (01/23/2024)   Epic    Unable to Pay for  Housing in the Last Year: No    Number of Times Moved in the Last Year: 0    Homeless in the Last Year: No  Utilities: Not At Risk (01/26/2024)   Epic    Threatened with loss of utilities: No  Health Literacy: Adequate Health Literacy (01/26/2024)   B1300 Health Literacy    Frequency of need for help with medical instructions: Never  Outpatient Medications Prior to Visit  Medication Sig Dispense Refill   albuterol  (VENTOLIN  HFA) 108 (90 Base) MCG/ACT inhaler INHALE 1 OR 2 PUFFS INTO THE LUNGS EVERY SIX HOURS AS NEEDED FOR WHEEZING OR SHORTNESS OF BREATH. 18 g 1   Alcohol Swabs (B-D SINGLE USE SWABS REGULAR) PADS USE AS DIRECTED 100 each 0   budesonide -formoterol  (SYMBICORT ) 80-4.5 MCG/ACT inhaler Inhale 2 puffs into the lungs 2 (two) times daily. 1 each 3   Cholecalciferol (VITAMIN D -3) 25 MCG (1000 UT) CAPS Take 1,000 Units by mouth daily.     Cyanocobalamin  (B-12) 1000 MCG CAPS Take 1,000 mcg by mouth daily.     diltiazem  (CARDIZEM ) 30 MG tablet TAKE 1 TABLET EVERY DAY 90 tablet 3   Dulaglutide  (TRULICITY ) 3 MG/0.5ML SOAJ Inject 3 mg into the skin every 7 (seven) days. 8 mL 3   fluticasone  (CUTIVATE ) 0.05 % cream APPLY TO AFFECTED AREAS ON FACE UP TO TWICE DAILY AS NEEDED FOR RASH. 60 g 11   gabapentin  (NEURONTIN ) 300 MG capsule Take 1 capsule (300 mg total) by mouth at bedtime. 90 capsule 3   levothyroxine  (SYNTHROID ) 150 MCG tablet TAKE 1 TABLET EVERY MORNING 90 tablet 3   Multiple Vitamins-Minerals (MULTIVITAMIN WITH MINERALS) tablet Take 1 tablet by mouth daily.     Omega-3 Fatty Acids (FISH OIL) 1000 MG CPDR Take 1,000 mg by mouth daily.     ONETOUCH ULTRA TEST test strip USE AS INSTRUCTED. USE TWICE DAILY. 200 strip 3   sildenafil  (VIAGRA ) 100 MG tablet TAKE 1 TABLET BY MOUTH AS NEEDED FOR ERECTILE DYSFUNCTION 20 tablet 3   tamsulosin  (FLOMAX ) 0.4 MG CAPS capsule Take 1 capsule (0.4 mg total) by mouth daily. 30 capsule 5   TRUEplus Lancets 33G MISC TEST DAILY 100 each 0    pravastatin  (PRAVACHOL ) 20 MG tablet TAKE 1 TABLET EVERY DAY 90 tablet 3   No facility-administered medications prior to visit.    Allergies  Allergen Reactions   Metformin And Related Other (See Comments)    Low B12, gait disturbance, myopathy, neuropathy, pernicious anemia    Crestor  [Rosuvastatin ] Other (See Comments)    Made me feel sluggish and drained Planning to retry on 12/14/20   Shellfish Allergy Nausea And Vomiting    Seafood-upset stomach    ROS Review of Systems  Constitutional:  Negative for chills and fever.  HENT:  Negative for congestion and sore throat.   Eyes:  Negative for pain and discharge.  Respiratory:  Negative for cough and shortness of breath.   Cardiovascular:  Negative for chest pain and palpitations.  Gastrointestinal:  Negative for constipation, diarrhea, nausea and vomiting.  Endocrine: Negative for polydipsia and polyuria.  Genitourinary:  Negative for dysuria and hematuria.  Musculoskeletal:  Positive for back pain. Negative for neck pain and neck stiffness.  Skin:  Negative for rash.  Neurological:  Positive for numbness (B/l feet). Negative for dizziness, weakness and headaches.  Psychiatric/Behavioral:  Negative for agitation and behavioral problems.       Objective:    Physical Exam Vitals reviewed.  Constitutional:      General: He is not in acute distress.    Appearance: He is not diaphoretic.  HENT:     Head: Normocephalic and atraumatic.     Nose: Nose normal.     Mouth/Throat:     Mouth: Mucous membranes are moist.  Eyes:     General: No scleral icterus.    Extraocular Movements: Extraocular movements intact.  Cardiovascular:  Rate and Rhythm: Normal rate and regular rhythm.     Heart sounds: Normal heart sounds. No murmur heard. Pulmonary:     Breath sounds: Normal breath sounds. No wheezing or rales.  Musculoskeletal:     Cervical back: Neck supple. No tenderness.     Lumbar back: Tenderness (Lower lumbar and  right paraspinal area) present. Negative right straight leg raise test and negative left straight leg raise test.     Right lower leg: No edema.     Left lower leg: No edema.  Skin:    General: Skin is warm.     Findings: No rash.  Neurological:     General: No focal deficit present.     Mental Status: He is alert and oriented to person, place, and time.     Cranial Nerves: No cranial nerve deficit.     Sensory: Sensory deficit (B/l feet) present.     Motor: No weakness.  Psychiatric:        Mood and Affect: Mood normal.        Behavior: Behavior normal.     BP 136/74   Pulse 85   Ht 5' 9 (1.753 m)   Wt 200 lb (90.7 kg)   SpO2 98%   BMI 29.53 kg/m  Wt Readings from Last 3 Encounters:  02/09/24 200 lb (90.7 kg)  01/26/24 200 lb (90.7 kg)  10/06/23 198 lb 12.8 oz (90.2 kg)    Lab Results  Component Value Date   TSH 2.920 02/03/2024   Lab Results  Component Value Date   WBC 3.3 (L) 02/03/2024   HGB 14.3 02/03/2024   HCT 44.3 02/03/2024   MCV 91 02/03/2024   PLT 197 02/03/2024   Lab Results  Component Value Date   NA 136 02/03/2024   K 4.6 02/03/2024   CO2 22 02/03/2024   GLUCOSE 88 02/03/2024   BUN 11 02/03/2024   CREATININE 1.13 02/03/2024   BILITOT 0.7 02/03/2024   ALKPHOS 80 02/03/2024   AST 25 02/03/2024   ALT 14 02/03/2024   PROT 7.3 02/03/2024   ALBUMIN 4.6 02/03/2024   CALCIUM  10.8 (H) 02/03/2024   EGFR 73 02/03/2024   Lab Results  Component Value Date   CHOL 156 02/03/2024   Lab Results  Component Value Date   HDL 49 02/03/2024   Lab Results  Component Value Date   LDLCALC 97 02/03/2024   Lab Results  Component Value Date   TRIG 48 02/03/2024   Lab Results  Component Value Date   CHOLHDL 3.2 02/03/2024   Lab Results  Component Value Date   HGBA1C 5.8 (H) 02/03/2024      Assessment & Plan:   Problem List Items Addressed This Visit       Cardiovascular and Mediastinum   Essential hypertension (Chronic)   BP Readings from  Last 1 Encounters:  02/09/24 136/74   Well-controlled  Has Diltiazem  for PSVT Counseled for compliance with the medications Advised DASH diet and moderate exercise/walking, at least 150 mins/week      Relevant Medications   pravastatin  (PRAVACHOL ) 40 MG tablet   Other Relevant Orders   CMP14+EGFR   CBC with Differential/Platelet   PSVT (paroxysmal supraventricular tachycardia)   Asymptomatic currently Has diltiazem  as needed, as has not required it for a long time      Relevant Medications   pravastatin  (PRAVACHOL ) 40 MG tablet     Endocrine   Type 2 diabetes mellitus with diabetic neuropathy, without long-term current use  of insulin (HCC) - Primary (Chronic)   Lab Results  Component Value Date   HGBA1C 5.8 (H) 02/03/2024   Well controlled On Trulicity  3 mg dose currently, refills sent to specialty pharmacy [facility] form submitted Advised to follow diabetic diet On pravastatin  F/u CMP and lipid panel Diabetic eye exam: Advised to follow up with Ophthalmology for diabetic eye exam  Had recurrent falls likely due to diabetic neuropathy in the past, on gabapentin  300 mg nightly now Has been evaluated by neurology for neuropathy      Relevant Medications   pravastatin  (PRAVACHOL ) 40 MG tablet   Other Relevant Orders   CMP14+EGFR   Hemoglobin A1c   Hypothyroidism (Chronic)   Lab Results  Component Value Date   TSH 2.920 02/03/2024   On levothyroxine  150 mcg daily        Genitourinary   BPH (benign prostatic hyperplasia)   Chronic weak urinary stream, but only at nighttime, symptoms improved with tamsulosin  0.4 mg QD, but prefers to avoid daily dosing Used to be followed by urology        Other   Hyperlipidemia (Chronic)   On pravastatin  20 mg QD Due to LDL elevation, increased dose of pravastatin  to 40 mg QD Did not tolerate Crestor  Advised to follow low carb, low-cholesterol diet for now      Relevant Medications   pravastatin   (PRAVACHOL ) 40 MG tablet   Other Relevant Orders   Lipid Profile   Hypercalcemia   Last CMP showed borderline elevated calcium  Recheck BMP and intact PTH      Relevant Orders   CMP14+EGFR   Parathyroid  hormone, intact (no Ca)       Meds ordered this encounter  Medications   pravastatin  (PRAVACHOL ) 40 MG tablet    Sig: Take 1 tablet (40 mg total) by mouth daily at 6 PM.    Dispense:  90 tablet    Refill:  3    Follow-up: Return in about 4 months (around 06/09/2024) for DM.    Suzzane MARLA Blanch, MD

## 2024-02-09 NOTE — Assessment & Plan Note (Signed)
 Chronic weak urinary stream, but only at nighttime, symptoms improved with tamsulosin  0.4 mg QD, but prefers to avoid daily dosing Used to be followed by urology

## 2024-02-09 NOTE — Patient Instructions (Signed)
 Please start taking Pravastatin  40 mg once daily instead of 20 mg.  Please continue to take medications as prescribed.  Please continue to follow low carb diet and perform moderate exercise/walking at least 150 mins/week.  Please get fasting blood tests done before the next visit.

## 2024-02-09 NOTE — Assessment & Plan Note (Signed)
 BP Readings from Last 1 Encounters:  02/09/24 136/74   Well-controlled  Has Diltiazem  for PSVT Counseled for compliance with the medications Advised DASH diet and moderate exercise/walking, at least 150 mins/week

## 2024-06-21 ENCOUNTER — Ambulatory Visit: Payer: Self-pay | Admitting: Internal Medicine

## 2025-01-26 ENCOUNTER — Ambulatory Visit
